# Patient Record
Sex: Female | Born: 1935 | Race: White | Hispanic: No | Marital: Married | Smoking: Never smoker
Health system: Southern US, Community
[De-identification: ages and names within clinical notes are randomized; demographics above are authoritative.]

## PROBLEM LIST (undated history)

## (undated) DIAGNOSIS — G309 Alzheimer's disease, unspecified: Secondary | ICD-10-CM

## (undated) DIAGNOSIS — F329 Major depressive disorder, single episode, unspecified: Secondary | ICD-10-CM

## (undated) DIAGNOSIS — R413 Other amnesia: Secondary | ICD-10-CM

## (undated) DIAGNOSIS — F32A Depression, unspecified: Secondary | ICD-10-CM

## (undated) DIAGNOSIS — A439 Nocardiosis, unspecified: Secondary | ICD-10-CM

## (undated) DIAGNOSIS — K219 Gastro-esophageal reflux disease without esophagitis: Secondary | ICD-10-CM

## (undated) DIAGNOSIS — F0280 Dementia in other diseases classified elsewhere without behavioral disturbance: Secondary | ICD-10-CM

## (undated) DIAGNOSIS — M7660 Achilles tendinitis, unspecified leg: Secondary | ICD-10-CM

## (undated) DIAGNOSIS — F419 Anxiety disorder, unspecified: Secondary | ICD-10-CM

## (undated) DIAGNOSIS — M797 Fibromyalgia: Secondary | ICD-10-CM

## (undated) HISTORY — DX: Major depressive disorder, single episode, unspecified: F32.9

## (undated) HISTORY — DX: Depression, unspecified: F32.A

## (undated) HISTORY — PX: CHOLECYSTECTOMY: SHX55

## (undated) HISTORY — DX: Other amnesia: R41.3

## (undated) HISTORY — PX: ABDOMINAL HYSTERECTOMY: SHX81

## (undated) HISTORY — DX: Anxiety disorder, unspecified: F41.9

## (undated) HISTORY — PX: APPENDECTOMY: SHX54

## (undated) HISTORY — PX: TONSILLECTOMY AND ADENOIDECTOMY: SUR1326

## (undated) HISTORY — DX: Gastro-esophageal reflux disease without esophagitis: K21.9

## (undated) HISTORY — DX: Dementia in other diseases classified elsewhere without behavioral disturbance: F02.80

## (undated) HISTORY — DX: Alzheimer's disease, unspecified: G30.9

## (undated) HISTORY — DX: Achilles tendinitis, unspecified leg: M76.60

## (undated) HISTORY — DX: Nocardiosis, unspecified: A43.9

## (undated) HISTORY — DX: Fibromyalgia: M79.7

## (undated) HISTORY — PX: BLADDER REPAIR: SHX76

## (undated) HISTORY — PX: BREAST BIOPSY: SHX20

---

## 1998-03-23 ENCOUNTER — Ambulatory Visit (HOSPITAL_COMMUNITY): Admission: RE | Admit: 1998-03-23 | Discharge: 1998-03-23 | Payer: Self-pay | Admitting: Family Medicine

## 1998-03-25 ENCOUNTER — Ambulatory Visit (HOSPITAL_COMMUNITY): Admission: RE | Admit: 1998-03-25 | Discharge: 1998-03-25 | Payer: Self-pay | Admitting: Ophthalmology

## 1998-04-19 ENCOUNTER — Other Ambulatory Visit: Admission: RE | Admit: 1998-04-19 | Discharge: 1998-04-19 | Payer: Self-pay | Admitting: Family Medicine

## 1999-04-21 ENCOUNTER — Other Ambulatory Visit: Admission: RE | Admit: 1999-04-21 | Discharge: 1999-04-21 | Payer: Self-pay | Admitting: Family Medicine

## 1999-12-02 ENCOUNTER — Encounter: Payer: Self-pay | Admitting: Family Medicine

## 1999-12-02 ENCOUNTER — Encounter: Admission: RE | Admit: 1999-12-02 | Discharge: 1999-12-02 | Payer: Self-pay | Admitting: Family Medicine

## 2000-01-20 ENCOUNTER — Ambulatory Visit (HOSPITAL_COMMUNITY): Admission: RE | Admit: 2000-01-20 | Discharge: 2000-01-20 | Payer: Self-pay | Admitting: Family Medicine

## 2000-01-20 ENCOUNTER — Encounter: Payer: Self-pay | Admitting: Family Medicine

## 2000-01-31 ENCOUNTER — Encounter: Payer: Self-pay | Admitting: Internal Medicine

## 2000-03-06 ENCOUNTER — Ambulatory Visit (HOSPITAL_COMMUNITY): Admission: RE | Admit: 2000-03-06 | Discharge: 2000-03-06 | Payer: Self-pay | Admitting: Gastroenterology

## 2000-03-06 ENCOUNTER — Encounter: Payer: Self-pay | Admitting: Gastroenterology

## 2000-04-26 ENCOUNTER — Encounter: Admission: RE | Admit: 2000-04-26 | Discharge: 2000-04-26 | Payer: Self-pay | Admitting: Family Medicine

## 2000-04-26 ENCOUNTER — Encounter: Payer: Self-pay | Admitting: Family Medicine

## 2000-04-26 ENCOUNTER — Other Ambulatory Visit: Admission: RE | Admit: 2000-04-26 | Discharge: 2000-04-26 | Payer: Self-pay | Admitting: Family Medicine

## 2000-04-26 ENCOUNTER — Encounter: Payer: Self-pay | Admitting: Internal Medicine

## 2000-05-31 ENCOUNTER — Encounter: Payer: Self-pay | Admitting: Family Medicine

## 2000-05-31 ENCOUNTER — Encounter: Admission: RE | Admit: 2000-05-31 | Discharge: 2000-05-31 | Payer: Self-pay | Admitting: Family Medicine

## 2001-04-30 ENCOUNTER — Encounter: Payer: Self-pay | Admitting: Family Medicine

## 2001-04-30 ENCOUNTER — Other Ambulatory Visit: Admission: RE | Admit: 2001-04-30 | Discharge: 2001-04-30 | Payer: Self-pay | Admitting: Family Medicine

## 2001-04-30 ENCOUNTER — Encounter: Admission: RE | Admit: 2001-04-30 | Discharge: 2001-04-30 | Payer: Self-pay | Admitting: Family Medicine

## 2001-06-03 ENCOUNTER — Encounter: Admission: RE | Admit: 2001-06-03 | Discharge: 2001-06-03 | Payer: Self-pay | Admitting: Family Medicine

## 2001-06-03 ENCOUNTER — Encounter: Payer: Self-pay | Admitting: Family Medicine

## 2002-03-17 ENCOUNTER — Ambulatory Visit (HOSPITAL_COMMUNITY): Admission: RE | Admit: 2002-03-17 | Discharge: 2002-03-17 | Payer: Self-pay | Admitting: Ophthalmology

## 2002-05-15 ENCOUNTER — Other Ambulatory Visit: Admission: RE | Admit: 2002-05-15 | Discharge: 2002-05-15 | Payer: Self-pay | Admitting: Family Medicine

## 2002-06-03 ENCOUNTER — Encounter: Payer: Self-pay | Admitting: Urology

## 2002-06-04 ENCOUNTER — Encounter: Admission: RE | Admit: 2002-06-04 | Discharge: 2002-06-04 | Payer: Self-pay | Admitting: Family Medicine

## 2002-06-04 ENCOUNTER — Encounter: Payer: Self-pay | Admitting: Family Medicine

## 2002-06-12 ENCOUNTER — Observation Stay (HOSPITAL_COMMUNITY): Admission: RE | Admit: 2002-06-12 | Discharge: 2002-06-13 | Payer: Self-pay | Admitting: Urology

## 2003-05-20 ENCOUNTER — Other Ambulatory Visit: Admission: RE | Admit: 2003-05-20 | Discharge: 2003-05-20 | Payer: Self-pay | Admitting: Family Medicine

## 2003-06-11 ENCOUNTER — Encounter: Admission: RE | Admit: 2003-06-11 | Discharge: 2003-06-11 | Payer: Self-pay | Admitting: Family Medicine

## 2003-06-11 ENCOUNTER — Encounter: Payer: Self-pay | Admitting: Family Medicine

## 2004-05-18 ENCOUNTER — Other Ambulatory Visit: Admission: RE | Admit: 2004-05-18 | Discharge: 2004-05-18 | Payer: Self-pay | Admitting: Family Medicine

## 2004-06-13 ENCOUNTER — Encounter: Admission: RE | Admit: 2004-06-13 | Discharge: 2004-06-13 | Payer: Self-pay | Admitting: Family Medicine

## 2004-12-27 ENCOUNTER — Encounter: Admission: RE | Admit: 2004-12-27 | Discharge: 2004-12-27 | Payer: Self-pay | Admitting: Family Medicine

## 2005-05-29 ENCOUNTER — Other Ambulatory Visit: Admission: RE | Admit: 2005-05-29 | Discharge: 2005-05-29 | Payer: Self-pay | Admitting: Family Medicine

## 2005-06-21 ENCOUNTER — Encounter: Admission: RE | Admit: 2005-06-21 | Discharge: 2005-06-21 | Payer: Self-pay | Admitting: Family Medicine

## 2005-08-15 ENCOUNTER — Ambulatory Visit: Payer: Self-pay | Admitting: Gastroenterology

## 2005-08-29 ENCOUNTER — Ambulatory Visit: Payer: Self-pay | Admitting: Gastroenterology

## 2006-06-22 ENCOUNTER — Encounter: Admission: RE | Admit: 2006-06-22 | Discharge: 2006-06-22 | Payer: Self-pay | Admitting: Family Medicine

## 2007-05-08 ENCOUNTER — Encounter: Admission: RE | Admit: 2007-05-08 | Discharge: 2007-05-08 | Payer: Self-pay | Admitting: Family Medicine

## 2007-06-24 ENCOUNTER — Encounter: Admission: RE | Admit: 2007-06-24 | Discharge: 2007-06-24 | Payer: Self-pay | Admitting: Family Medicine

## 2007-10-15 ENCOUNTER — Ambulatory Visit: Payer: Self-pay | Admitting: Gastroenterology

## 2007-10-15 LAB — CONVERTED CEMR LAB
ALT: 19 units/L (ref 0–35)
AST: 20 units/L (ref 0–37)
Albumin: 3.5 g/dL (ref 3.5–5.2)
Alkaline Phosphatase: 97 units/L (ref 39–117)
Amylase: 82 units/L (ref 27–131)
BUN: 14 mg/dL (ref 6–23)
Basophils Absolute: 0.1 10*3/uL (ref 0.0–0.1)
Basophils Relative: 0.9 % (ref 0.0–1.0)
Bilirubin, Direct: 0.1 mg/dL (ref 0.0–0.3)
CO2: 29 meq/L (ref 19–32)
Calcium: 9.2 mg/dL (ref 8.4–10.5)
Chloride: 103 meq/L (ref 96–112)
Creatinine, Ser: 0.9 mg/dL (ref 0.4–1.2)
Eosinophils Absolute: 0.1 10*3/uL (ref 0.0–0.6)
Eosinophils Relative: 1.5 % (ref 0.0–5.0)
Ferritin: 104.4 ng/mL (ref 10.0–291.0)
Folate: >20 ng/mL
GFR calc Af Amer: 80 mL/min
GFR calc non Af Amer: 66 mL/min
Glucose, Bld: 101 mg/dL — ABNORMAL HIGH (ref 70–99)
HCT: 38.4 % (ref 36.0–46.0)
Hemoglobin: 12.9 g/dL (ref 12.0–15.0)
Iron: 49 ug/dL (ref 42–145)
Lipase: 20 units/L (ref 11.0–59.0)
Lymphocytes Relative: 34 % (ref 12.0–46.0)
MCHC: 33.7 g/dL (ref 30.0–36.0)
MCV: 87.8 fL (ref 78.0–100.0)
Monocytes Absolute: 0.5 10*3/uL (ref 0.2–0.7)
Monocytes Relative: 7.7 % (ref 3.0–11.0)
Neutro Abs: 3.7 10*3/uL (ref 1.4–7.7)
Neutrophils Relative %: 55.9 % (ref 43.0–77.0)
Platelets: 273 10*3/uL (ref 150–400)
Potassium: 4.4 meq/L (ref 3.5–5.1)
RBC: 4.37 M/uL (ref 3.87–5.11)
RDW: 12.2 % (ref 11.5–14.6)
Saturation Ratios: 15.9 % — ABNORMAL LOW (ref 20.0–50.0)
Sodium: 140 meq/L (ref 135–145)
TSH: 0.63 microintl units/mL (ref 0.35–5.50)
Total Bilirubin: 0.6 mg/dL (ref 0.3–1.2)
Total Protein: 6.6 g/dL (ref 6.0–8.3)
Transferrin: 220.6 mg/dL (ref >=212.0)
Vitamin B-12: 436 pg/mL (ref 211–911)
WBC: 6.7 10*3/uL (ref 4.5–10.5)

## 2007-10-16 ENCOUNTER — Ambulatory Visit: Payer: Self-pay | Admitting: Gastroenterology

## 2007-10-17 ENCOUNTER — Ambulatory Visit (HOSPITAL_COMMUNITY): Admission: RE | Admit: 2007-10-17 | Discharge: 2007-10-17 | Payer: Self-pay | Admitting: Gastroenterology

## 2008-06-29 ENCOUNTER — Encounter: Admission: RE | Admit: 2008-06-29 | Discharge: 2008-06-29 | Payer: Self-pay | Admitting: Family Medicine

## 2009-01-01 ENCOUNTER — Encounter: Payer: Self-pay | Admitting: Gastroenterology

## 2009-02-09 ENCOUNTER — Ambulatory Visit: Payer: Self-pay | Admitting: Internal Medicine

## 2009-02-09 DIAGNOSIS — K219 Gastro-esophageal reflux disease without esophagitis: Secondary | ICD-10-CM | POA: Insufficient documentation

## 2009-02-09 DIAGNOSIS — F329 Major depressive disorder, single episode, unspecified: Secondary | ICD-10-CM | POA: Insufficient documentation

## 2009-02-09 DIAGNOSIS — G7000 Myasthenia gravis without (acute) exacerbation: Secondary | ICD-10-CM | POA: Insufficient documentation

## 2009-02-10 LAB — CONVERTED CEMR LAB
ALT: 21 units/L (ref 0–35)
AST: 24 units/L (ref 0–37)
Albumin: 3.5 g/dL (ref 3.5–5.2)
Alkaline Phosphatase: 99 units/L (ref 39–117)
BUN: 13 mg/dL (ref 6–23)
Basophils Absolute: 0 10*3/uL (ref 0.0–0.1)
Basophils Relative: 0.4 % (ref 0.0–3.0)
Bilirubin, Direct: 0.1 mg/dL (ref 0.0–0.3)
CO2: 31 meq/L (ref 19–32)
Calcium: 9.2 mg/dL (ref 8.4–10.5)
Chloride: 110 meq/L (ref 96–112)
Creatinine, Ser: 1 mg/dL (ref 0.4–1.2)
Eosinophils Absolute: 0.1 10*3/uL (ref 0.0–0.7)
Eosinophils Relative: 1.8 % (ref 0.0–5.0)
Glucose, Bld: 98 mg/dL (ref 70–99)
HCT: 37.3 % (ref 36.0–46.0)
Hemoglobin: 13.2 g/dL (ref 12.0–15.0)
Lymphocytes Relative: 32.3 % (ref 12.0–46.0)
Lymphs Abs: 2.2 10*3/uL (ref 0.7–4.0)
MCHC: 35.3 g/dL (ref 30.0–36.0)
MCV: 86.2 fL (ref 78.0–100.0)
Monocytes Absolute: 0.6 10*3/uL (ref 0.1–1.0)
Monocytes Relative: 8.5 % (ref 3.0–12.0)
Neutro Abs: 4 10*3/uL (ref 1.4–7.7)
Neutrophils Relative %: 57 % (ref 43.0–77.0)
Phosphorus: 3.6 mg/dL (ref 2.3–4.6)
Platelets: 279 10*3/uL (ref 150.0–400.0)
Potassium: 4.3 meq/L (ref 3.5–5.1)
RBC: 4.33 M/uL (ref 3.87–5.11)
RDW: 11.9 % (ref 11.5–14.6)
Sodium: 145 meq/L (ref 135–145)
TSH: 0.6 microintl units/mL (ref 0.35–5.50)
Total Bilirubin: 0.6 mg/dL (ref 0.3–1.2)
Total Protein: 6.7 g/dL (ref 6.0–8.3)
WBC: 6.9 10*3/uL (ref 4.5–10.5)

## 2009-03-08 ENCOUNTER — Ambulatory Visit: Payer: Self-pay | Admitting: Internal Medicine

## 2009-05-14 ENCOUNTER — Ambulatory Visit: Payer: Self-pay | Admitting: Internal Medicine

## 2009-06-04 ENCOUNTER — Encounter: Admission: RE | Admit: 2009-06-04 | Discharge: 2009-06-04 | Payer: Self-pay | Admitting: Internal Medicine

## 2009-06-07 ENCOUNTER — Encounter: Payer: Self-pay | Admitting: Internal Medicine

## 2009-07-06 ENCOUNTER — Telehealth: Payer: Self-pay | Admitting: Internal Medicine

## 2009-07-20 ENCOUNTER — Encounter: Payer: Self-pay | Admitting: Internal Medicine

## 2009-08-12 ENCOUNTER — Encounter: Payer: Self-pay | Admitting: Internal Medicine

## 2009-08-23 ENCOUNTER — Telehealth: Payer: Self-pay | Admitting: Internal Medicine

## 2009-10-02 ENCOUNTER — Ambulatory Visit: Payer: Self-pay | Admitting: Family Medicine

## 2009-10-02 DIAGNOSIS — J309 Allergic rhinitis, unspecified: Secondary | ICD-10-CM | POA: Insufficient documentation

## 2009-11-12 ENCOUNTER — Ambulatory Visit: Payer: Self-pay | Admitting: Internal Medicine

## 2009-11-12 DIAGNOSIS — F411 Generalized anxiety disorder: Secondary | ICD-10-CM | POA: Insufficient documentation

## 2009-11-13 LAB — CONVERTED CEMR LAB
ALT: 21 units/L (ref 0–35)
AST: 21 units/L (ref 0–37)
Albumin: 4 g/dL (ref 3.5–5.2)
Alkaline Phosphatase: 106 units/L (ref 39–117)
Bilirubin, Direct: <0.1 mg/dL (ref 0.0–0.3)
Total Bilirubin: 0.3 mg/dL (ref 0.3–1.2)
Total Protein: 6.7 g/dL (ref 6.0–8.3)

## 2009-11-15 LAB — CONVERTED CEMR LAB
Albumin: 4 g/dL (ref 3.5–5.2)
BUN: 15 mg/dL (ref 6–23)
Basophils Absolute: 0 10*3/uL (ref 0.0–0.1)
Basophils Relative: 0.6 % (ref 0.0–3.0)
CO2: 19 meq/L (ref 19–32)
Calcium: 9.4 mg/dL (ref 8.4–10.5)
Chloride: 108 meq/L (ref 96–112)
Creatinine, Ser: 0.98 mg/dL (ref 0.40–1.20)
Eosinophils Absolute: 0.1 10*3/uL (ref 0.0–0.7)
Eosinophils Relative: 1.8 % (ref 0.0–5.0)
Glucose, Bld: 106 mg/dL — ABNORMAL HIGH (ref 70–99)
HCT: 39.8 % (ref 36.0–46.0)
Hemoglobin: 13.3 g/dL (ref 12.0–15.0)
Lymphocytes Relative: 22.6 % (ref 12.0–46.0)
Lymphs Abs: 1.3 10*3/uL (ref 0.7–4.0)
MCHC: 33.4 g/dL (ref 30.0–36.0)
MCV: 89.6 fL (ref 78.0–100.0)
Monocytes Absolute: 0.5 10*3/uL (ref 0.1–1.0)
Monocytes Relative: 8.2 % (ref 3.0–12.0)
Neutro Abs: 3.9 10*3/uL (ref 1.4–7.7)
Neutrophils Relative %: 66.8 % (ref 43.0–77.0)
Phosphorus: 3.4 mg/dL (ref 2.3–4.6)
Platelets: 225 10*3/uL (ref 150.0–400.0)
Potassium: 4.4 meq/L (ref 3.5–5.3)
RBC: 4.44 M/uL (ref 3.87–5.11)
RDW: 12.2 % (ref 11.5–14.6)
Sodium: 144 meq/L (ref 135–145)
TSH: 0.87 microintl units/mL (ref 0.35–5.50)
WBC: 5.8 10*3/uL (ref 4.5–10.5)

## 2009-11-26 ENCOUNTER — Telehealth: Payer: Self-pay | Admitting: Internal Medicine

## 2009-11-29 ENCOUNTER — Telehealth: Payer: Self-pay | Admitting: Internal Medicine

## 2009-12-21 ENCOUNTER — Encounter: Payer: Self-pay | Admitting: Internal Medicine

## 2010-01-05 ENCOUNTER — Encounter: Payer: Self-pay | Admitting: Internal Medicine

## 2010-01-05 LAB — HM COLONOSCOPY

## 2010-02-15 ENCOUNTER — Telehealth: Payer: Self-pay | Admitting: Internal Medicine

## 2010-03-22 ENCOUNTER — Telehealth (INDEPENDENT_AMBULATORY_CARE_PROVIDER_SITE_OTHER): Payer: Self-pay | Admitting: *Deleted

## 2010-03-23 ENCOUNTER — Encounter: Payer: Self-pay | Admitting: Internal Medicine

## 2010-05-10 ENCOUNTER — Ambulatory Visit: Payer: Self-pay | Admitting: Internal Medicine

## 2010-05-10 DIAGNOSIS — IMO0001 Reserved for inherently not codable concepts without codable children: Secondary | ICD-10-CM | POA: Insufficient documentation

## 2010-05-10 DIAGNOSIS — M810 Age-related osteoporosis without current pathological fracture: Secondary | ICD-10-CM | POA: Insufficient documentation

## 2010-05-11 LAB — CONVERTED CEMR LAB
ALT: 18 units/L (ref 0–35)
AST: 24 units/L (ref 0–37)
Albumin: 3.7 g/dL (ref 3.5–5.2)
Alkaline Phosphatase: 86 units/L (ref 39–117)
BUN: 15 mg/dL (ref 6–23)
Basophils Absolute: 0.1 10*3/uL (ref 0.0–0.1)
Basophils Relative: 0.9 % (ref 0.0–3.0)
Bilirubin, Direct: 0.1 mg/dL (ref 0.0–0.3)
CO2: 30 meq/L (ref 19–32)
Calcium: 8.9 mg/dL (ref 8.4–10.5)
Chloride: 111 meq/L (ref 96–112)
Creatinine, Ser: 1.1 mg/dL (ref 0.4–1.2)
Eosinophils Absolute: 0.1 10*3/uL (ref 0.0–0.7)
Eosinophils Relative: 2.1 % (ref 0.0–5.0)
GFR calc non Af Amer: 54.09 mL/min (ref >60)
Glucose, Bld: 115 mg/dL — ABNORMAL HIGH (ref 70–99)
HCT: 38.2 % (ref 36.0–46.0)
Hemoglobin: 13.2 g/dL (ref 12.0–15.0)
Lymphocytes Relative: 25.2 % (ref 12.0–46.0)
Lymphs Abs: 1.4 10*3/uL (ref 0.7–4.0)
MCHC: 34.7 g/dL (ref 30.0–36.0)
MCV: 88.4 fL (ref 78.0–100.0)
Monocytes Absolute: 0.5 10*3/uL (ref 0.1–1.0)
Monocytes Relative: 8.4 % (ref 3.0–12.0)
Neutro Abs: 3.6 10*3/uL (ref 1.4–7.7)
Neutrophils Relative %: 63.4 % (ref 43.0–77.0)
Phosphorus: 3.2 mg/dL (ref 2.3–4.6)
Platelets: 240 10*3/uL (ref 150.0–400.0)
Potassium: 4.2 meq/L (ref 3.5–5.1)
RBC: 4.32 M/uL (ref 3.87–5.11)
RDW: 13.2 % (ref 11.5–14.6)
Sodium: 146 meq/L — ABNORMAL HIGH (ref 135–145)
TSH: 0.58 microintl units/mL (ref 0.35–5.50)
Total Bilirubin: 0.6 mg/dL (ref 0.3–1.2)
Total Protein: 6.2 g/dL (ref 6.0–8.3)
WBC: 5.7 10*3/uL (ref 4.5–10.5)

## 2010-05-14 ENCOUNTER — Emergency Department (HOSPITAL_COMMUNITY): Admission: EM | Admit: 2010-05-14 | Discharge: 2010-05-14 | Payer: Self-pay | Admitting: Emergency Medicine

## 2010-05-17 ENCOUNTER — Telehealth: Payer: Self-pay | Admitting: Internal Medicine

## 2010-05-24 ENCOUNTER — Telehealth: Payer: Self-pay | Admitting: Internal Medicine

## 2010-06-06 ENCOUNTER — Encounter: Admission: RE | Admit: 2010-06-06 | Discharge: 2010-06-06 | Payer: Self-pay | Admitting: Internal Medicine

## 2010-06-06 ENCOUNTER — Encounter: Payer: Self-pay | Admitting: Internal Medicine

## 2010-06-06 ENCOUNTER — Ambulatory Visit: Payer: Self-pay | Admitting: Family Medicine

## 2010-06-06 ENCOUNTER — Ambulatory Visit: Payer: Self-pay | Admitting: Internal Medicine

## 2010-06-06 LAB — CONVERTED CEMR LAB
Bilirubin Urine: NEGATIVE
Blood in Urine, dipstick: NEGATIVE
Glucose, Urine, Semiquant: NEGATIVE
Ketones, urine, test strip: NEGATIVE
Nitrite: POSITIVE
Protein, U semiquant: NEGATIVE
Specific Gravity, Urine: 1.02
Urobilinogen, UA: 0.2
pH: 6.5

## 2010-06-06 LAB — HM MAMMOGRAPHY: HM Mammogram: NORMAL

## 2010-06-07 ENCOUNTER — Encounter: Payer: Self-pay | Admitting: Family Medicine

## 2010-11-14 ENCOUNTER — Encounter: Payer: Self-pay | Admitting: Internal Medicine

## 2010-11-21 ENCOUNTER — Ambulatory Visit
Admission: RE | Admit: 2010-11-21 | Discharge: 2010-11-21 | Payer: Self-pay | Source: Home / Self Care | Attending: Internal Medicine | Admitting: Internal Medicine

## 2010-11-21 ENCOUNTER — Encounter: Payer: Self-pay | Admitting: Family Medicine

## 2010-11-29 NOTE — Procedures (Signed)
Summary: Colonoscopy/[facility]  Colonoscopy/[facility]   Imported By: Lanelle Bal 01/11/2010 08:14:26  _____________________________________________________________________  External Attachment:    Type:   Image     Comment:   External Document  Appended Document: Colonoscopy/[facility]    Clinical Lists Changes  Observations: Added new observation of COLONOSCOPY: small sessile rectal polyp--path done internal hemorrhoids diverticulosis recall 5 years Dr Loreta Ave (01/05/2010 8:28)       Colonoscopy  Procedure date:  01/05/2010  Findings:      small sessile rectal polyp--path done internal hemorrhoids diverticulosis recall 5 years Dr Loreta Ave

## 2010-11-29 NOTE — Assessment & Plan Note (Signed)
Summary: SICK   Vital Signs:  Patient profile:   75 year old female Weight:      172 pounds Temp:     97.8 degrees F oral Pulse rate:   84 / minute Pulse rhythm:   regular BP sitting:   118 / 72  (left arm) Cuff size:   regular  Vitals Entered By: Lowella Petties CMA (October 02, 2009 10:48 AM) CC: Sinus drainage   Acute Visit History:      The patient complains of cough, earache, sinus problems, and sore throat.  These symptoms began 2 weeks ago.  She denies fever, headache, and rash.  Other comments include: Has felt  mucus in throat for weeks, then in last 24 hours has felt worse.  Sneezing a lot. .        The character of the cough is described as nonproductive.  There is no history of wheezing, sleep interference, or shortness of breath associated with her cough.        She complains of sinus pressure, ears being blocked, and nasal congestion.        Problems Prior to Update: 1)  Chest Pain  (ICD-786.50) 2)  Depression  (ICD-311) 3)  Gerd  (ICD-530.81) 4)  Myasthenia Gravis Without Exacerbation  (ICD-358.00)  Current Medications (verified): 1)  Nexium 40 Mg  Cpdr (Esomeprazole Magnesium) .Marland Kitchen.. 1 Capsule Each Day 30 Minutes Before Meal 2)  Mestinon 60 Mg Tabs (Pyridostigmine Bromide) .... Take 2 Tablets By Mouth Three Times A Day 3)  Anaspaz 0.125 Mg Tabs (Hyoscyamine Sulfate) .... Take 1 Tablet 3-4 Times A Day 4)  Imipramine Pamoate 75 Mg Caps (Imipramine Pamoate) .... Take 1 Tablet By Mouth At Bedtime 5)  Calcium 600 1500 Mg Tabs (Calcium Carbonate) .... Take 2 By Mouth Once Daily 6)  One-A-Day 50 Plus  Tabs (Multiple Vitamins-Minerals) .... Take 1 Tablet By Mouth Once Daily 7)  Alprazolam 0.25 Mg Tabs (Alprazolam) .... Take 1 By Mouth Two Times A Day As Needed Anxiety 8)  Cetirizine Hcl 10 Mg Tabs (Cetirizine Hcl) .Marland Kitchen.. 1 Tab By Mouth Daily.  Allergies (verified): 1)  Fosamax (Alendronate Sodium)  Past History:  Past medical, surgical, family and social histories  (including risk factors) reviewed, and no changes noted (except as noted below).  Past Medical History: Reviewed history from 02/09/2009 and no changes required. 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 Appendectomy--1970 Cholecystectomy-8/08 Hysterectomy--1970     Carcinoma in situ Tonsillectomy/adenoidectomy  1945 Breast biopsy --1990's Bladder repair --1990's  Dr Patsi Sears  Family History: Reviewed history from 02/09/2009 and no changes required. Mom died of breast cancer @55  Dad died of cirrhosis @46  1 brother--had MI, ?HTN 2 sisters CAD in paternal uncles also No DM ?Uncle with HTN  Social History: Reviewed history from 02/09/2009 and no changes required. Married--3 children Retired--manager of school cafeteria  Never Smoked Alcohol use-no  Review of Systems General:  Denies fatigue and fever. CV:  Denies chest pain or discomfort. Resp:  Denies shortness of breath.  Physical Exam  General:  elderly female in NAD Ears:  clear fluid B TM, no pus, no bulging, moderate light reflex Nose:  nasal discharge clear, mucosal pallor.   Mouth:  MMM Neck:  no carotid bruit or thyromegaly no cervical or supraclavicular lymphadenopathy  Lungs:  Normal respiratory effort, chest expands symmetrically. Lungs are clear to auscultation, no crackles or wheezes. Heart:  Normal  rate and regular rhythm. S1 and S2 normal without gallop, murmur, click, rub or other extra sounds.   Impression & Recommendations:  Problem # 1:  ALLERGIC RHINITIS (ICD-477.9) Treat with nasal irrigationand antihistamine...no clear infection. Follow up with primary if not improving.  Her updated medication list for this problem includes:    Cetirizine Hcl 10 Mg Tabs (Cetirizine hcl) .Marland Kitchen... 1 tab by mouth daily.  Complete Medication List: 1)   Nexium 40 Mg Cpdr (Esomeprazole magnesium) .Marland Kitchen.. 1 capsule each day 30 minutes before meal 2)  Mestinon 60 Mg Tabs (Pyridostigmine bromide) .... Take 2 tablets by mouth three times a day 3)  Anaspaz 0.125 Mg Tabs (Hyoscyamine sulfate) .... Take 1 tablet 3-4 times a day 4)  Imipramine Pamoate 75 Mg Caps (Imipramine pamoate) .... Take 1 tablet by mouth at bedtime 5)  Calcium 600 1500 Mg Tabs (Calcium carbonate) .... Take 2 by mouth once daily 6)  One-a-day 50 Plus Tabs (Multiple vitamins-minerals) .... Take 1 tablet by mouth once daily 7)  Alprazolam 0.25 Mg Tabs (Alprazolam) .... Take 1 by mouth two times a day as needed anxiety 8)  Cetirizine Hcl 10 Mg Tabs (Cetirizine hcl) .Marland Kitchen.. 1 tab by mouth daily.  Patient Instructions: 1)  Zyrtec at bedtime. 2)  Start nasal saline spray or irrigation.   Prior Medications (reviewed today): NEXIUM 40 MG  CPDR (ESOMEPRAZOLE MAGNESIUM) 1 capsule each day 30 minutes before meal MESTINON 60 MG TABS (PYRIDOSTIGMINE BROMIDE) take 2 tablets by mouth three times a day ANASPAZ 0.125 MG TABS (HYOSCYAMINE SULFATE) take 1 tablet 3-4 times a day IMIPRAMINE PAMOATE 75 MG CAPS (IMIPRAMINE PAMOATE) take 1 tablet by mouth at bedtime CALCIUM 600 1500 MG TABS (CALCIUM CARBONATE) take 2 by mouth once daily ONE-A-DAY 50 PLUS  TABS (MULTIPLE VITAMINS-MINERALS) take 1 tablet by mouth once daily ALPRAZOLAM 0.25 MG TABS (ALPRAZOLAM) take 1 by mouth two times a day as needed anxiety Current Allergies (reviewed today): FOSAMAX (ALENDRONATE SODIUM)

## 2010-11-29 NOTE — Progress Notes (Signed)
Summary: still feeling well  Phone Note Call from Patient   Caller: Patient Call For: [name] Salt MD Summary of Call: Pt called with follow up after going to ER.  She says she has finished the abx that she was given for UTI and still feels fine.  Asks again if she needs to follow up, advised her no, as long as she is feeling well, but if anything changes she is to call us back. Initial call taken by: Lowella Petties CMA,  May 24, 2010 4:34 PM  Follow-up for Phone Call        okay Follow-up by: [name] Salt MD,  May 24, 2010 7:43 PM

## 2010-11-29 NOTE — Progress Notes (Signed)
Summary: wants referral to GI  Phone Note Call from Patient Call back at Home Phone [phone]   Caller: Patient Call For: Cindee Salt MD Summary of Call: Pt wants referral to Dr. Charna Elizabeth for colonoscopy and endoscocpy.  Please advise. Initial call taken by: Lowella Petties CMA,  July 06, 2009 12:04 PM  Follow-up for Phone Call        referral made I assume she has had prior visits  ?due for follow up studies?? Follow-up by: Cindee Salt MD,  July 06, 2009 1:56 PM  Additional Follow-up for Phone Call Additional follow up Details #1::        Appt made with Dr Loreta Ave on 07/20/2009 at 9:00. Additional Follow-up by: Carlton Adam,  July 06, 2009 2:58 PM

## 2010-11-29 NOTE — Assessment & Plan Note (Signed)
Summary: NEW PT TO EST/CLE   Vital Signs:  Patient profile:   75 year old female Height:      66.5 inches Weight:      167 pounds BMI:     26.65 Temp:     98.6 degrees F oral Pulse rate:   68 / minute Pulse rhythm:   regular BP sitting:   120 / 80  (left arm) Cuff size:   regular  Vitals Entered By: Mervin Hack CMA (February 09, 2009 11:57 AM)  History of Present Illness: CC: new patient to establish care  Establishing here Had been with Dr Artis Flock and they were not staying due to the concierge care  Myasthennia gravis for about 3-4 years Dr Sandria Manly treats has mestinon--gives her stomach trouble. 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  Past Surgical History:    Appendectomy--1970    Cholecystectomy-8/08    Hysterectomy--1970     Carcinoma in situ    Tonsillectomy/adenoidectomy  1945    Breast biopsy --1990's    Bladder repair --1990's  Dr Patsi Sears  Family History:    Mom died of breast cancer @55     Dad died of cirrhosis @46     1 brother--had MI, ?HTN    2 sisters    CAD in paternal uncles also    No DM    ?Uncle with HTN  Social History:    Married--3 Engineer, structural of school cafeteria     Never Smoked    Alcohol use-no    Smoking Status:  never  Review of Systems General:  had sleep problems with gall bladder illness and lost weight back closer to normal  weight wears seat belt. Eyes:  Complains of double vision; denies vision loss-1 eye; diplopia with myasthenia. ENT:  Complains of ringing in ears; denies decreased hearing; mild tinnitus for last 2 weeks Own teeth--regular with dentist. CV:  Complains of chest pain or discomfort; denies difficulty breathing at night, difficulty breathing while lying down, fainting, lightheadness, palpitations, and shortness of breath with exertion; Occ mild chest discomfort--she relates to gas and indigestion. Resp:  Complains of cough; denies shortness of breath; occ slgiht cough. GI:  Complains of indigestion; denies abdominal pain, bloody stools, change in bowel habits, and dark tarry stools; bowels more regular since cholecystectomy metamucil in past. GU:  Complains of incontinence and nocturia; denies dysuria; had tacking procedure by Dr Patsi Sears. MS:  Denies joint pain and joint swelling; no sig joint issues. Derm:  Denies lesion(s) and rash; stable warty growths--nothing changing. Neuro:  Complains of numbness and weakness; denies headaches and tingling; occ weakness with myasthenia mild numbness in feet--intermittent----has been thoroughly evaluated. Psych:  Denies anxiety and depression. Heme:  Denies abnormal bruising and enlarge lymph nodes.  Physical Exam  General:  alert and normal appearance.   Eyes:  pupils equal, pupils round, and pupils reactive to light.   Full EOM  Mouth:  no erythema and no lesions.   Neck:  supple, no masses, no thyromegaly, no carotid bruits, and no cervical lymphadenopathy.   Lungs:  normal respiratory effort and normal breath sounds.   Heart:  normal rate, regular rhythm, no murmur, and no gallop.   Abdomen:  soft, non-tender, and no masses.   Msk:  no joint tenderness and no joint swelling.   Pulses:  faint to 1+ in feet Extremities:  no edema Neurologic:  alert & oriented X3, strength normal in all extremities, and gait normal.   Skin:  no rashes and no  suspicious lesions.   Axillary Nodes:  No palpable lymphadenopathy Psych:  normally interactive, good eye contact, not anxious appearing, and not depressed appearing.     Impression & Recommendations:  Problem # 1:  CHEST PAIN (ICD-786.50) Assessment New  sounds fairly classic like reflux EKG benign will check labs though  Orders: EKG w/ Interpretation (93000) TLB-Renal Function Panel (80069-RENAL) TLB-CBC Platelet - w/Differential (85025-CBCD) TLB-Hepatic/Liver Function Pnl (80076-HEPATIC) TLB-TSH (Thyroid Stimulating Hormone) (84443-TSH) Venipuncture (14782)  Problem # 2:  DEPRESSION (ICD-311) Assessment: Improved will have her try to wean off this and see her back  Her updated medication list for this problem includes:    Imipramine Pamoate 75 Mg Caps (Imipramine pamoate) .Marland Kitchen... Take 1 tablet by mouth at bedtime  Problem # 3:  GERD (ICD-530.81) Assessment: Unchanged mild but ongoing symptoms  needs to continue meds  Her updated medication list for this problem includes:    Nexium 40 Mg Cpdr (Esomeprazole magnesium) .Marland Kitchen... 1 capsule each day 30 minutes before meal    Anaspaz 0.125 Mg Tabs (Hyoscyamine sulfate) .Marland Kitchen... Take 1 tablet 3-4 times a day  Problem # 4:  MYASTHENIA GRAVIS WITHOUT EXACERBATION (ICD-358.00) Assessment: Comment Only Dr Sandria Manly treats  Complete Medication List: 1)  Nexium 40 Mg Cpdr (Esomeprazole magnesium) .Marland Kitchen.. 1 capsule each day 30 minutes before meal 2)  Mestinon 60 Mg Tabs (Pyridostigmine bromide) .... Take 2 tablets by mouth three times a day 3)  Anaspaz 0.125 Mg Tabs (Hyoscyamine sulfate) .... Take 1 tablet 3-4 times a day 4)  Imipramine Pamoate 75 Mg Caps (Imipramine pamoate) .... Take 1 tablet by mouth at bedtime 5)  Calcium 600 1500 Mg Tabs (Calcium carbonate) .... Take 1 by mouth once daily 6)  One-a-day 50 Plus Tabs (Multiple vitamins-minerals) .... Take 1 tablet by mouth once daily  Other Orders: Pneumoccal Vaccine Adm- Medicare  (G0009) Admin 1st Vaccine (95621)  Patient Instructions: 1)  Please decrease the imipramine to 2 days per week for 2 weeks and if still doing well, then stop it 2)  Please schedule a follow-up appointment in 3 months .       Current Allergies (reviewed today): FOSAMAX (ALENDRONATE SODIUM)  Immunization History:  Tetanus/Td Immunization History:    Tetanus/Td:  Historical (05/20/2003)  Pneumovax Immunization History:    Pneumovax:  Pneumovax (Medicare) (02/09/2009)  Zostavax History:    Zostavax # 1:  Zostavax (09/30/2007)  Immunizations Administered:  Pneumonia Vaccine:    Vaccine Type: Pneumovax (Medicare)    Site: right deltoid    Mfr: Merck    Dose: 0.5 ml    Route: IM    Given by: Mervin Hack CMA    Exp. Date: 10/02/2009    Lot #: [id number]    VIS given: 05/27/96 version given February 09, 2009.

## 2010-11-29 NOTE — Progress Notes (Signed)
Summary: asking for amitriptyline  Phone Note Call from Patient Call back at Home Phone [phone]   Caller: Patient Call For: Cindee Salt MD Summary of Call: Pt had a problem a few years ago with aching in her arms.  Dr. Phylliss Bob treated her with amitriptyline.  The achiness has come back and she is asking if she can try that again.  She generally has pain in her arms, legs, feet.  Just doesnt feel well.  Uses [facility]. Initial call taken by: Lowella Petties CMA,  February 15, 2010 4:39 PM  Follow-up for Phone Call        She is on imipramine which is very similar Probably need to set up appt to evaluate what is going on and make a decision about Rx Follow-up by: Cindee Salt MD,  February 16, 2010 8:14 AM  Additional Follow-up for Phone Call Additional follow up Details #1::        spoke with patient and she have been off the imipramine since feb/mar, she doesn't remember. Pt states she was told by her pharmacist that she just can't stop taking that she must titrate that med, i asked her why she stopped she said Dr. Ermalene Searing told her to when she seen her on a Sat? that was back in Dec 2010. Pt seen Dr. Alphonsus Sias in Jan 2011. Pt would like to know if she should re-start med? pt was very confused. DeShannon Smith CMA Duncan Dull)  February 16, 2010 9:39 AM   Yes, she should restart the medicine That is what had been helping the pain Cindee Salt MD  February 16, 2010 11:25 AM   left message on machine with results, advised pt to call if any questions.  Additional Follow-up by: Mervin Hack CMA Duncan Dull),  February 16, 2010 2:51 PM

## 2010-11-29 NOTE — Miscellaneous (Signed)
Summary: BONE DENSITY  Clinical Lists Changes  Orders: Added new Test order of T-Bone Densitometry (77080) - Signed Added new Test order of T-Lumbar Vertebral Assessment (77082) - Signed 

## 2010-11-29 NOTE — Assessment & Plan Note (Signed)
Summary: ? UTI   Vital Signs:  Patient profile:   75 year old female Height:      66.5 inches Weight:      177.50 pounds BMI:     28.32 Temp:     97.7 degrees F oral Pulse rate:   92 / minute Pulse rhythm:   regular BP sitting:   122 / 80  (left arm) Cuff size:   regular  Vitals Entered By: Delilah Shan CMA Duncan Dull) (June 06, 2010 2:04 PM) CC: ? UTI   History of Present Illness: dysuria: yes, mild feeling of burning. duration of symptoms: since ER visit 05/14/10.  abdominal pain: yes fevers: no back pain:no vomiting:no other concerns: Prev at ER for presyncope.  Started on keflex.  No presyncope since then.  Some improvement in symptoms but not complete resolution.     Allergies: 1)  Fosamax (Alendronate Sodium)  Review of Systems       See HPI.  Otherwise negative.    Physical Exam  General:  GEN: nad, alert and oriented HEENT: mucous membranes moist NECK: supple CV: rrr.  PULM: ctab, no inc wob ABD: soft, +bs, suprapubic area minimally tender, no rebound EXT: no edema SKIN: no acute rash BACK: no CVA pain    Impression & Recommendations:  Problem # 1:  DYSURIA (ICD-788.1) Start cipro, have patient call back if fever or increase in symptoms.  Contact with ucx results.  Orders: Specimen Handling (16109) T-Culture, Urine ([id number])  Her updated medication list for this problem includes:    Cipro 250 Mg Tabs (Ciprofloxacin hcl) .Marland Kitchen... 1 by mouth two times a day  for 3 days  Complete Medication List: 1)  Mestinon 60 Mg Tabs (Pyridostigmine bromide) .... Take 2 tablets by mouth two times a day 2)  Anaspaz 0.125 Mg Tabs (Hyoscyamine sulfate) .... Take 1 tablet two times a day 3)  Imipramine Pamoate 75 Mg Caps (Imipramine pamoate) .... Take 1 tablet by mouth at bedtime 4)  One-a-day 50 Plus Tabs (Multiple vitamins-minerals) .... Take 1 tablet by mouth once daily 5)  Omeprazole 20 Mg Cpdr (Omeprazole) .... Take 1 by mouth once daily 6)  Ergocalciferol  50000 Unit Caps (Ergocalciferol) .Marland Kitchen.. 1 tab by mouth monthly 7)  Calcium Carbonate-vitamin D 600-400 Mg-unit Tabs (Calcium carbonate-vitamin d) .... Two per day 8)  Cipro 250 Mg Tabs (Ciprofloxacin hcl) .Marland Kitchen.. 1 by mouth two times a day  for 3 days  Patient Instructions: 1)  We'll contact you with your lab report.  Take the antibiotics and let us know if you aren't getting better.  Prescriptions: CIPRO 250 MG TABS (CIPROFLOXACIN HCL) 1 by mouth two times a day  for 3 days  #6 x 0   Entered and Authorized by:   Crawford Givens MD   Signed by:   Crawford Givens MD on 06/06/2010   Method used:   Print then Give to Patient   RxID:   [id number]   Laboratory Results   Urine Tests  Date/Time Received: June 06, 2010 2:13 PM   Routine Urinalysis   Color: yellow Appearance: Hazy Glucose: negative   (Normal Range: Negative) Bilirubin: negative   (Normal Range: Negative) Ketone: negative   (Normal Range: Negative) Spec. Gravity: 1.020   (Normal Range: 1.003-1.035) Blood: negative   (Normal Range: Negative) pH: 6.5   (Normal Range: 5.0-8.0) Protein: negative   (Normal Range: Negative) Urobilinogen: 0.2   (Normal Range: 0-1) Nitrite: positive   (Normal Range: Negative) Leukocyte Esterace: small   (  Normal Range: Negative)        Current Allergies (reviewed today): FOSAMAX (ALENDRONATE SODIUM)

## 2010-11-29 NOTE — Consult Note (Signed)
Summary: [facility]  [facility]   Imported By: Lanelle Bal 01/10/2010 11:19:18  _____________________________________________________________________  External Attachment:    Type:   Image     Comment:   External Document  Appended Document: [facility] planning screening colonoscopy

## 2010-11-29 NOTE — Assessment & Plan Note (Signed)
Summary: 3 MTH F/U/CLE   Vital Signs:  Patient profile:   75 year old female Weight:      168 pounds Temp:     98.7 degrees F oral Pulse rate:   76 / minute Pulse rhythm:   regular BP sitting:   120 / 70  (left arm) Cuff size:   regular  Vitals Entered By: Mervin Hack CMA (May 14, 2009 10:48 AM)  History of Present Illness: Chief Complaint: 3 month follow-up  Feels some better  Her eyes seem better Mestinon helps but does bother her stomach takes amaspaz 30 minutes before mestinon and generally tolerates  Nexium seems to control heartburn Still some mild symptoms she relates to since chole  Just had extended trip [place], [place], etc Myasthenia did well  has started trying to wean the imipramine started every other night cause mood was okay again  MIld memory problems seems like just recall issues no apparent apraxia   Allergies: 1)  Fosamax (Alendronate Sodium)  Past History:  Past medical, surgical, family and social histories (including risk factors) reviewed for relevance to current acute and chronic problems.  Past Medical History: Reviewed history from 02/09/2009 and no changes required. 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 Appendectomy--1970 Cholecystectomy-8/08 Hysterectomy--1970     Carcinoma in situ Tonsillectomy/adenoidectomy  1945 Breast biopsy --1990's Bladder repair --1990's  Dr Patsi Sears  Family History: Reviewed history from 02/09/2009 and no changes required. Mom died of breast cancer @55  Dad died of cirrhosis @46  1 brother--had MI, ?HTN 2 sisters CAD in paternal uncles also No DM ?Uncle with HTN  Social History: Reviewed history from 02/09/2009 and no changes required. 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         stress with son-in law leaving marriage--future unclear weight is stable Sleeps well with the imipramine nocturia x 2 appetite is fine Intermittent abd symptoms--upper ?spasm  Physical Exam  General:  alert and normal appearance.   Neck:  supple, no masses, no thyromegaly, no carotid bruits, and no cervical lymphadenopathy.   Lungs:  normal respiratory effort and normal breath sounds.   Heart:  normal rate, regular rhythm, no murmur, and no gallop.   Abdomen:  soft and non-tender.   Extremities:  no edema Psych:  normally interactive, good eye contact, not anxious appearing, and not depressed appearing.     Impression & Recommendations:  Problem # 1:  DEPRESSION (ICD-311) Assessment Improved but again trying to wean imipramine counselled to continue med and not try wean  Her updated medication list for this problem includes:    Imipramine Pamoate 75 Mg Caps (Imipramine pamoate) .Marland Kitchen... Take 1 tablet by mouth at bedtime    Alprazolam 0.25 Mg Tabs (Alprazolam) .Marland Kitchen... Take 1 by mouth two times a day as needed anxiety  Problem # 2:  MYASTHENIA GRAVIS WITHOUT EXACERBATION (ICD-358.00) Assessment: Unchanged doing well needs anaspaz before taking mestinon did well even with extended travelling Dr Sandria Manly manages  Problem # 3:  GERD (ICD-530.81) Assessment: Unchanged fine with the nexium  Her updated medication list for this problem includes:    Nexium 40 Mg Cpdr (Esomeprazole magnesium) .Marland Kitchen... 1 capsule each day 30 minutes before meal    Anaspaz 0.125 Mg Tabs (Hyoscyamine sulfate) .Marland Kitchen... Take 1 tablet 3-4 times a day  Complete  Medication List: 1)  Nexium 40 Mg Cpdr (Esomeprazole magnesium) .Marland Kitchen.. 1 capsule each day 30 minutes before meal 2)  Mestinon 60 Mg Tabs (Pyridostigmine bromide) .... Take 2 tablets by mouth three times a day 3)  Anaspaz 0.125 Mg Tabs (Hyoscyamine sulfate)  .... Take 1 tablet 3-4 times a day 4)  Imipramine Pamoate 75 Mg Caps (Imipramine pamoate) .... Take 1 tablet by mouth at bedtime 5)  Calcium 600 1500 Mg Tabs (Calcium carbonate) .... Take 2 by mouth once daily 6)  One-a-day 50 Plus Tabs (Multiple vitamins-minerals) .... Take 1 tablet by mouth once daily 7)  Alprazolam 0.25 Mg Tabs (Alprazolam) .... Take 1 by mouth two times a day as needed anxiety  Patient Instructions: 1)  Please continue to take the imipramine each evening 2)  Please schedule a follow-up appointment in 6 months .   Current Allergies (reviewed today): FOSAMAX (ALENDRONATE SODIUM)

## 2010-11-29 NOTE — Letter (Signed)
Summary: Results Follow up Letter  [facility]  [address] [place], [place] [address]   Phone: [phone]  Fax: [phone]    06/07/2009 MRN: [id number]  [facility] [address] [place], [place]  [address]  Dear Ms. Minnehan,  The following are the results of your recent test(s):  Test         Result    Pap Smear:        Normal _____  Not Normal _____ Comments: ______________________________________________________ Cholesterol: LDL(Bad cholesterol):         Your goal is less than:         HDL (Good cholesterol):       Your goal is more than: Comments:  ______________________________________________________ Mammogram:        Normal __X___  Not Normal _____ Comments: Repeat in 1 year  ___________________________________________________________________ Hemoccult:        Normal _____  Not normal _______ Comments:    _____________________________________________________________________ Other Tests:    We routinely do not discuss normal results over the telephone.  If you desire a copy of the results, or you have any questions about this information we can discuss them at your next office visit.   Sincerely,      Tillman Abide, MD

## 2010-11-29 NOTE — Assessment & Plan Note (Signed)
Summary: 6 month follow-up   Vital Signs:  Patient profile:   75 year old female Weight:      172 pounds BMI:     27.44 Temp:     98.1 degrees F oral Pulse rate:   88 / minute Pulse rhythm:   regular BP sitting:   120 / 80  (left arm) Cuff size:   regular  Vitals Entered By: Mervin Hack CMA Duncan Dull) (November 12, 2009 10:43 AM) CC: 40month follow-up   History of Present Illness: Doing fairly well No new concerns  At times, she doesn't feel right Not sure if it is the myasthenia Seems to be more at the end of the day Decreased energy later in day No limitations though No eye problems  No sig GI problems Still on nexium for acid problems---GI recommends continuing Bowels okay  No overt depression Mild "down" feelings--as noted Occ anxiety--hasn't needed xanax in general  Allergies: 1)  Fosamax (Alendronate Sodium)  Past History:  Past medical, surgical, family and social histories (including risk factors) reviewed for relevance to current acute and chronic problems.  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 Appendectomy--1970 Cholecystectomy-8/08 Hysterectomy--1970     Carcinoma in situ Tonsillectomy/adenoidectomy  1945 Breast biopsy --1990's Bladder repair --1990's  Dr Patsi Sears  Family History: Reviewed history from 02/09/2009 and no changes required. Mom died of breast cancer @55  Dad died of cirrhosis @46  1 brother--had MI, ?HTN 2 sisters CAD in paternal uncles also No DM ?Uncle with HTN  Social History: Reviewed history from 02/09/2009 and no changes required. Married--3 children Retired--manager of school cafeteria  Never Smoked Alcohol use-no  Review of Systems       appetite okay still uses anaspaz before the mestiinon weight stable Sleeps  okay--some nocturia  Physical Exam  General:  alert and normal appearance.   Neck:  supple, no masses, no thyromegaly, no carotid bruits, and no cervical lymphadenopathy.   Lungs:  normal respiratory effort and normal breath sounds.   Heart:  normal rate, regular rhythm, no murmur, and no gallop.   Abdomen:  soft, non-tender, no masses, no hepatomegaly, and no splenomegaly.   Msk:  no joint tenderness and no joint swelling.   Pulses:  1+ in feet Extremities:  no edema Neurologic:  alert & oriented X3, strength normal in all extremities, and gait normal.   Skin:  no rashes and no suspicious lesions.   Axillary Nodes:  No palpable lymphadenopathy Inguinal Nodes:  No significant adenopathy Psych:  normally interactive, good eye contact, not anxious appearing, and not depressed appearing.     Impression & Recommendations:  Problem # 1:  FATIGUE (ICD-780.79) Assessment New  vague, mostly end of day May be shorter days and being stuck inside will just check some labs  Orders: TLB-Renal Function Panel (80069-RENAL) TLB-CBC Platelet - w/Differential (85025-CBCD) TLB-Hepatic/Liver Function Pnl (80076-HEPATIC) TLB-TSH (Thyroid Stimulating Hormone) (84443-TSH) Venipuncture ([address])  Problem # 2:  DEPRESSION (ICD-311) Assessment: Unchanged fair control on imipramine recommeded continuing  Her updated medication list for this problem includes:    Imipramine Pamoate 75 Mg Caps (Imipramine pamoate) .Marland Kitchen... Take 1 tablet by mouth at bedtime    Alprazolam 0.25 Mg Tabs (Alprazolam) .Marland Kitchen... Take 1 by mouth two times a day as needed anxiety  Problem # 3:  GERD (ICD-530.81) Assessment: Unchanged stable on meds  Her updated medication list for this problem includes:  Nexium 40 Mg Cpdr (Esomeprazole magnesium) .Marland Kitchen... 1 capsule each day 30 minutes before meal    Anaspaz 0.125 Mg Tabs (Hyoscyamine sulfate) .Marland Kitchen... Take 1 tablet two times a day  Problem # 4:  MYASTHENIA GRAVIS WITHOUT  EXACERBATION (ICD-358.00) Assessment: Comment Only stable Dr Sandria Manly manages  Complete Medication List: 1)  Nexium 40 Mg Cpdr (Esomeprazole magnesium) .Marland Kitchen.. 1 capsule each day 30 minutes before meal 2)  Mestinon 60 Mg Tabs (Pyridostigmine bromide) .... Take 2 tablets by mouth two times a day 3)  Anaspaz 0.125 Mg Tabs (Hyoscyamine sulfate) .... Take 1 tablet two times a day 4)  Imipramine Pamoate 75 Mg Caps (Imipramine pamoate) .... Take 1 tablet by mouth at bedtime 5)  Calcium 600 1500 Mg Tabs (Calcium carbonate) .... Take 2 by mouth once daily 6)  One-a-day 50 Plus Tabs (Multiple vitamins-minerals) .... Take 1 tablet by mouth once daily 7)  Alprazolam 0.25 Mg Tabs (Alprazolam) .... Take 1 by mouth two times a day as needed anxiety 8)  Cetirizine Hcl 10 Mg Tabs (Cetirizine hcl) .Marland Kitchen.. 1 tab by mouth daily.  Patient Instructions: 1)  Please schedule a follow-up appointment in 6 months .   Current Allergies (reviewed today): FOSAMAX (ALENDRONATE SODIUM)  Appended Document: Orders Update    Clinical Lists Changes  Orders: Added new Test order of T- * Misc. Laboratory test [phone]) - Signed

## 2010-11-29 NOTE — Progress Notes (Signed)
Summary: prior auth needed for omeprazole  Phone Note From Pharmacy   Caller: [facility] Summary of Call: Prior Berkley Harvey is needed for omeprazole, form is on  your desk. Initial call taken by: Lowella Petties CMA,  Mar 22, 2010 10:16 AM  Follow-up for Phone Call        form done Follow-up by: Cindee Salt MD,  Mar 22, 2010 1:46 PM  Additional Follow-up for Phone Call Additional follow up Details #1::        Form faxed to [facility]. Additional Follow-up by: Beau Fanny,  Mar 22, 2010 2:37 PM

## 2010-11-29 NOTE — Miscellaneous (Signed)
Summary: Nexium refill  Clinical Lists Changes  Medications: Added new medication of NEXIUM 40 MG  CPDR (ESOMEPRAZOLE MAGNESIUM) 1 capsule each day 30 minutes before meal - Signed Rx of NEXIUM 40 MG  CPDR (ESOMEPRAZOLE MAGNESIUM) 1 capsule each day 30 minutes before meal;  #30 x 6;  Signed;  Entered by: Harlow Mares CMA;  Authorized by: Mardella Layman MD [facility];  Method used: Faxed to [facility], [address], [place], [place], [place]  [address], Ph: [phone] or [phone], Fax: [phone]    Prescriptions: NEXIUM 40 MG  CPDR (ESOMEPRAZOLE MAGNESIUM) 1 capsule each day 30 minutes before meal  #30 x 6   Entered by:   Harlow Mares CMA   Authorized by:   Mardella Layman MD [facility]   Signed by:   Harlow Mares CMA on 01/01/2009   Method used:   Faxed to ...       [facility] (retail)       [address]       [place], [place]  [address]       Ph: [phone] or [phone]       Fax: [phone]   RxID:   [id number]

## 2010-11-29 NOTE — Progress Notes (Signed)
Summary:  IMIPRAMINE PAMOATE  Phone Note Refill Request Message from:  [facility] [phone] on August 23, 2009 10:30 AM  Refills Requested: Medication #1:  IMIPRAMINE PAMOATE 75 MG CAPS take 1 tablet by mouth at bedtime E-Scribe Request, no last refilled date   Method Requested: Telephone to Pharmacy Initial call taken by: DeShannon Katrinka Blazing CMA Duncan Dull),  August 23, 2009 10:31 AM  Follow-up for Phone Call        okay to refill x 1 year Follow-up by: Cindee Salt MD,  August 23, 2009 1:35 PM  Additional Follow-up for Phone Call Additional follow up Details #1::        Rx faxed to pharmacy Additional Follow-up by: DeShannon Katrinka Blazing CMA Duncan Dull),  August 23, 2009 2:36 PM    Prescriptions: IMIPRAMINE PAMOATE 75 MG CAPS (IMIPRAMINE PAMOATE) take 1 tablet by mouth at bedtime  #30 x 12   Entered by:   Mervin Hack CMA (AAMA)   Authorized by:   Cindee Salt MD   Signed by:   Mervin Hack CMA (AAMA) on 08/23/2009   Method used:   [facility] to        [facility] [phone]* (retail)       [address]       [place], [place]  [address]       Ph: [phone] or [phone]       Fax: [phone]   RxID:   [id number]

## 2010-11-29 NOTE — Progress Notes (Signed)
Summary: Rx refill  Phone Note Refill Request Call back at Home Phone [phone] Message from:  Patient on November 29, 2009 1:39 PM  Left message on machine for patient to return call regarding which medication she wants.  Linde Gillis CMA Duncan Dull)  November 29, 2009 1:39 PM   Caller: Patient Call For: Cindee Salt MD    New/Updated Medications: OMEPRAZOLE 20 MG CPDR (OMEPRAZOLE) take 1 by mouth once daily Prescriptions: OMEPRAZOLE 20 MG CPDR (OMEPRAZOLE) take 1 by mouth once daily  #30 x 12   Entered by:   Mervin Hack CMA (AAMA)   Authorized by:   Cindee Salt MD   Signed by:   Mervin Hack CMA (AAMA) on 11/29/2009   Method used:   Faxed to ...       [facility] (retail)       [address]       [place], [place]  [address]       Ph: [phone] or [phone]       Fax: [phone]   RxID:   [id number]

## 2010-11-29 NOTE — Letter (Signed)
Summary: [facility] Records  [facility]James [facility] Records   Imported By: Beau Fanny 02/12/2009 15:57:23  _____________________________________________________________________  External Attachment:    Type:   Image     Comment:   External Document

## 2010-11-29 NOTE — Assessment & Plan Note (Signed)
Summary: 6 month follow up/rbh   Vital Signs:  Patient profile:   75 year old female Weight:      175 pounds Temp:     98.3 degrees F oral Pulse rate:   80 / minute Pulse rhythm:   regular BP sitting:   116 / 70  (left arm) Cuff size:   regular  Vitals Entered By: Mervin Hack CMA (AAMA) (May 10, 2010 11:00 AM) CC: 6 month follow-up   History of Present Illness: Had episode where she was very achy had been off the imipramine Did restart and she is feeling better  Stress with husband Recurrent kidney stones---had to cut vacation short Now having lithotripsies  feels that her anxiety is reasonably controlled the imipramine seems to help that some as well  DOing fine on the mestinon Controls the myasthenia  has to take the anaspaz for her stomach to tolerate the mestinon  stomach quiet on omeprazole bowels okay--variable    Allergies: 1)  Fosamax (Alendronate Sodium)  Past History:  Past medical, surgical, family and social histories (including risk factors) reviewed for relevance to current acute and chronic problems.  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 Appendectomy--1970 Cholecystectomy-8/08 Hysterectomy--1970     Carcinoma in situ Tonsillectomy/adenoidectomy  1945 Breast biopsy --1990's Bladder repair --1990's  Dr Patsi Sears  Family History: Reviewed history from 02/09/2009 and no changes required. Mom died of breast cancer @55  Dad died of cirrhosis @46  1 brother--had MI, ?HTN 2 sisters CAD in paternal uncles also No DM ?Uncle with HTN  Social History: Reviewed history from 02/09/2009 and no changes required. Married--3 children Retired--manager of school cafeteria  Never Smoked Alcohol use-no  Review of  Systems       sleps well with imipramine nocturia x 2 appetite is somewhat better now after prolonged trouble after gallbaldder weight is stable  Physical Exam  General:  alert and normal appearance.   Neck:  supple, no masses, no thyromegaly, no carotid bruits, and no cervical lymphadenopathy.   Lungs:  normal respiratory effort and normal breath sounds.   Heart:  normal rate, regular rhythm, no murmur, and no gallop.   Abdomen:  soft and non-tender.   Msk:  no joint tenderness and no joint swelling.   Pulses:  1+ in feet Extremities:  no edema Psych:  normally interactive, good eye contact, not anxious appearing, and not depressed appearing.     Impression & Recommendations:  Problem # 1:  FIBROMYALGIA (ICD-729.1) Assessment Comment Only  better back on the imipramine will consider wean next time if doing okay  Orders: Venipuncture ([address]) TLB-Renal Function Panel (80069-RENAL) TLB-CBC Platelet - w/Differential (85025-CBCD) TLB-Hepatic/Liver Function Pnl (80076-HEPATIC) TLB-TSH (Thyroid Stimulating Hormone) (84443-TSH)  Problem # 2:  OSTEOPOROSIS (ICD-733.00) Assessment: Comment Only  borderline 7 years ago adequate calcium will start monthly vitamin D recheck DEXA---consider bisphosphonate but had stomach trouble in past with fosamax  Orders: Radiology Referral (Radiology)  Problem # 3:  GERD (ICD-530.81) Assessment: Unchanged does okay with the med  Her updated medication list for this problem includes:    Anaspaz 0.125 Mg Tabs (Hyoscyamine sulfate) .Marland Kitchen... Take 1 tablet two times a day    Omeprazole 20 Mg Cpdr (Omeprazole) .Marland Kitchen... Take 1 by mouth once daily  Problem # 4:  MYASTHENIA GRAVIS WITHOUT EXACERBATION (ICD-358.00) Assessment: Unchanged Dr Sandria Manly manages she wonders about coming off the med---I told  her not to, talk to Dr Sandria Manly  Complete Medication List: 1)  Mestinon 60 Mg Tabs (Pyridostigmine bromide) .... Take 2 tablets by mouth two times a day 2)   Anaspaz 0.125 Mg Tabs (Hyoscyamine sulfate) .... Take 1 tablet two times a day 3)  Imipramine Pamoate 75 Mg Caps (Imipramine pamoate) .... Take 1 tablet by mouth at bedtime 4)  One-a-day 50 Plus Tabs (Multiple vitamins-minerals) .... Take 1 tablet by mouth once daily 5)  Omeprazole 20 Mg Cpdr (Omeprazole) .... Take 1 by mouth once daily 6)  Ergocalciferol 50000 Unit Caps (Ergocalciferol) .Marland Kitchen.. 1 tab by mouth monthly  Patient Instructions: 1)  Please schedule a follow-up appointment in 6 months .  2)  Schedule your mammogram.  3)  SChedule the bone density test Prescriptions: ERGOCALCIFEROL 50000 UNIT CAPS (ERGOCALCIFEROL) 1 tab by mouth monthly  #12 x 0   Entered and Authorized by:   Cindee Salt MD   Signed by:   Cindee Salt MD on 05/10/2010   Method used:   Print then Give to Patient   RxID:   [id number]   Current Allergies (reviewed today): FOSAMAX (ALENDRONATE SODIUM)   Bone Scan  Procedure date:  06/11/2003  Findings:      Borderline osteoporosis in lumbar spine (only a couple of the vertebrae) Hip is borderline osteopenia (between -1 and -1.6)

## 2010-11-29 NOTE — Letter (Signed)
Summary: [facility]   Imported By: Maryln Gottron 07/30/2009 10:30:47  _____________________________________________________________________  External Attachment:    Type:   Image     Comment:   External Document  Appended Document: [facility] nexium for GERD High fiber recommended for her diverticulosis

## 2010-11-29 NOTE — Progress Notes (Signed)
Summary: needs labs faxed  Phone Note Call from Patient Call back at Home Phone [phone]   Caller: Patient Call For: Cindee Salt MD Summary of Call: Pt has upcoming colonoscopy with Dr. Loreta Ave and she is asking that her recent lab results be faxed to that office at fax # [phone]. Initial call taken by: Lowella Petties CMA,  November 26, 2009 4:24 PM  Follow-up for Phone Call        okay to fax results Follow-up by: Cindee Salt MD,  November 27, 2009 10:49 AM  Additional Follow-up for Phone Call Additional follow up Details #1::        results faxed Additional Follow-up by: DeShannon Katrinka Blazing CMA Duncan Dull),  November 29, 2009 9:09 AM

## 2010-11-29 NOTE — Progress Notes (Signed)
Summary: went to [place]  Phone Note Call from Patient   Caller: Patient Call For: Cindee Salt MD Summary of Call: Pt states she went to [place] over the week end after having an episode of abd pain and confusion.  She was dx'd with a UTI and was told to follow up with you.  She says she is feeling better and will wait a few days to see how she does.  The did do a head CT to rule out stroke and it was normal.  Initial call taken by: Lowella Petties CMA,  May 17, 2010 9:37 AM  Follow-up for Phone Call        Please get the records from the ER and check on her tomorrow or Thursday to be sure she is still doing well Cindee Salt MD  May 17, 2010 12:14 PM   Additional Follow-up for Phone Call Additional follow up Details #1::        Spoke to patient and was informed that she is feeling much better. ER records are in your in box. Sydell Axon LPN  May 17, 2010 2:54 PM   Noted Additional Follow-up by: Cindee Salt MD,  May 17, 2010 5:31 PM

## 2010-11-29 NOTE — Letter (Signed)
Summary: [facility]  [facility]   Imported By: Maryln Gottron 08/17/2009 12:17:27  _____________________________________________________________________  External Attachment:    Type:   Image     Comment:   External Document  Appended Document: [facility] doing fine with ocular myasthenia gravis

## 2010-11-29 NOTE — Assessment & Plan Note (Signed)
Summary: Discuss meds   Vital Signs:  Patient profile:   75 year old female Weight:      167 pounds Temp:     98.6 degrees F oral Pulse rate:   76 / minute Pulse rhythm:   regular BP sitting:   112 / 60  (left arm) Cuff size:   regular  Vitals Entered By: Mervin Hack CMA (Mar 08, 2009 10:28 AM)  History of Present Illness: Chief Complaint: discuss meds  Weaned off imipramine Off completely for about 2 weeks has been awakening not feeling great  Starts to feel better as the day goes on  had Rx for citalopram but hasn't been taking it (Dr Artis Flock told her to stick with imipramine and not celexa)  Doesn't endorse anxiety hadn't used xanax since last year--but needed it a couple of times this week  does feel some depression Feels "down .Marland KitchenMarland KitchenMarland KitchenI just don't want to do anything"  Allergies: 1)  Fosamax (Alendronate Sodium)  Past History:  Past medical, surgical, family and social histories (including risk factors) reviewed for relevance to current acute and chronic problems.  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  Past Surgical History:    Reviewed history from 02/09/2009 and no changes required:    Appendectomy--1970    Cholecystectomy-8/08    Hysterectomy--1970     Carcinoma in situ    Tonsillectomy/adenoidectomy  1945    Breast biopsy --1990's    Bladder repair --1990's  Dr Patsi Sears  Family History:    Reviewed history from 02/09/2009 and no changes required:       Mom died of breast cancer @55        Dad died of cirrhosis @46        1 brother--had MI, ?HTN       2 sisters       CAD in paternal uncles also       No DM       ?Uncle with HTN  Social History:    Reviewed history from 02/09/2009 and no changes required:       Married--3 children       Retired--manager of school  cafeteria        Never Smoked       Alcohol use-no  Review of Systems       had lost 25# when sick with mysasthenia seems stable now Stress with daughter having family problems--husband left her some leg cramps again---more noticeable recently  Physical Exam  General:  alert and normal appearance.   Psych:  normally interactive, good eye contact, not anxious appearing, and not depressed appearing.     Impression & Recommendations:  Problem # 1:  DEPRESSION (ICD-311) Assessment Deteriorated lack of initiative and not "doing" as much may be related to sleep changes off imipramine but could also be a worsening of mild depression (more like a dysthymia)  P:  discussed options      will restart imipramine    hasn't been taking the citalopram--will remove from list     will refill xanax--hers is over 74 year old  Her updated medication list for this problem includes:    Imipramine Pamoate 75 Mg Caps (Imipramine pamoate) .Marland Kitchen... Take 1 tablet by mouth at bedtime    Alprazolam 0.25 Mg Tabs (Alprazolam) .Marland Kitchen... Take 1 by mouth two times a day  as needed anxiety  Complete Medication List: 1)  Nexium 40 Mg Cpdr (Esomeprazole magnesium) .Marland Kitchen.. 1 capsule each day 30 minutes before meal 2)  Mestinon 60 Mg Tabs (Pyridostigmine bromide) .... Take 2 tablets by mouth three times a day 3)  Anaspaz 0.125 Mg Tabs (Hyoscyamine sulfate) .... Take 1 tablet 3-4 times a day 4)  Imipramine Pamoate 75 Mg Caps (Imipramine pamoate) .... Take 1 tablet by mouth at bedtime 5)  Calcium 600 1500 Mg Tabs (Calcium carbonate) .... Take 1 by mouth once daily 6)  One-a-day 50 Plus Tabs (Multiple vitamins-minerals) .... Take 1 tablet by mouth once daily 7)  Alprazolam 0.25 Mg Tabs (Alprazolam) .... Take 1 by mouth two times a day as needed anxiety  Patient Instructions: 1)  Keep appointment on July 16th 2)  No citalopram 3)  Resume the imipramine 75mg  at bedtime Prescriptions: ALPRAZOLAM 0.25 MG TABS (ALPRAZOLAM) take  1 by mouth two times a day as needed anxiety  #30 x 0   Entered and Authorized by:   Cindee Salt MD   Signed by:   Cindee Salt MD on 03/08/2009   Method used:   Print then Give to Patient   RxID:   [id number]     Current Allergies (reviewed today): FOSAMAX (ALENDRONATE SODIUM)

## 2010-11-29 NOTE — Medication Information (Signed)
Summary: [employer] Prior Auth Approval for Omeprazole Capsule Dr from 03/02/10-  [employer] Prior Auth Approval for Omeprazole Capsule Dr from 03/02/10-03/23/11   Imported By: Beau Fanny 03/24/2010 14:50:16  _____________________________________________________________________  External Attachment:    Type:   Image     Comment:   External Document

## 2010-12-01 NOTE — Assessment & Plan Note (Signed)
Summary: 6 MONTH FOLLOW UP/[facility]   Vital Signs:  Patient profile:   75 year old female Weight:      174 pounds Temp:     97.8 degrees F oral Pulse rate:   84 / minute Pulse rhythm:   regular BP sitting:   128 / 88  (right arm) Cuff size:   regular  Vitals Entered By: Lowella Petties CMA, AAMA (November 21, 2010 10:28 AM) CC: 6 month follow up   History of Present Illness: Doing well No new concerns  Myasthenia is quiet Dr Sandria Manly just saw her and now doing yearly follow ups Occ feels tired in Am and will often feel better in the evening Never been a "easy getter upper"  Needs the anaspaz and PPi for her stomach this keeps it controlled  Mood has been good has cut back on the imipramine to every other day Not clear that she gets up easier without it Occ mild anxiety but nothing troubling Occ mild depressed mood---nothing persistent Feels better if she is busy  Discussed that bone density was improved on calcium and vitamin D still  Doesn't think she was fasting for her last blood work Glucose 115 probably normal then  Allergies: 1)  Fosamax (Alendronate Sodium)  Past History:  Past medical, surgical, family and social histories (including risk factors) reviewed for relevance to current acute and chronic problems.  Past Medical History: Reviewed history from 05/10/2010 and no changes required. 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 Appendectomy--1970 Cholecystectomy-8/08 Hysterectomy--1970     Carcinoma in situ Tonsillectomy/adenoidectomy  1945 Breast biopsy --1990's Bladder repair --1990's  Dr Patsi Sears  Family History: Reviewed history from 02/09/2009 and no changes required. Mom died of breast cancer @55  Dad died of cirrhosis @46  1 brother--had MI,  ?HTN 2 sisters CAD in paternal uncles also No DM ?Uncle with HTN  Social History: Reviewed history from 02/09/2009 and no changes required. Married--3 children Retired--manager of school cafeteria  Never Smoked Alcohol use-no  Review of Systems       Husband found with bladder cancer---seems okay with treatment appetite is fair weight is down a couple of pounds sleeps okay but nocturia x 1-2  Physical Exam  General:  alert and normal appearance.   Neck:  supple, no masses, no thyromegaly, no carotid bruits, and no cervical lymphadenopathy.   Lungs:  normal respiratory effort, no intercostal retractions, no accessory muscle use, and normal breath sounds.   Heart:  normal rate, regular rhythm, no murmur, and no gallop.   Abdomen:  soft, non-tender, and no masses.   Msk:  no joint tenderness and no joint swelling.   Extremities:  no edema Psych:  normally interactive, good eye contact, not anxious appearing, and not depressed appearing.     Impression & Recommendations:  Problem # 1:  GERD (ICD-530.81) Assessment Unchanged controlled with meds  Her updated medication list for this problem includes:    Anaspaz 0.125 Mg Tabs (Hyoscyamine sulfate) .Marland Kitchen... Take 1 tablet two times a day    Omeprazole 20 Mg Cpdr (Omeprazole) .Marland Kitchen... Take 1 by mouth once daily  Problem # 2:  OSTEOPOROSIS (ICD-733.00) Assessment: Improved bone density now down to osteopenic range will continue the meds   Her updated medication list for this problem includes:    Ergocalciferol 50000 Unit Caps (Ergocalciferol) .Marland Kitchen... 1 tab by mouth monthly    Calcium Carbonate-vitamin D 600-400  Mg-unit Tabs (Calcium carbonate-vitamin d) .Marland Kitchen..Marland Kitchen Two per day  Problem # 3:  ANXIETY (ICD-300.00) Assessment: Unchanged only occ spells has weaned some--- may need to continue every other day   Her updated medication list for this problem includes:    Imipramine Pamoate 75 Mg Caps (Imipramine pamoate) .Marland Kitchen... Take 1 tablet  every other night  at bedtime  Problem # 4:  MYASTHENIA GRAVIS WITHOUT EXACERBATION (ICD-358.00) Assessment: Unchanged stable sees Dr Sandria Manly  Complete Medication List: 1)  Mestinon 60 Mg Tabs (Pyridostigmine bromide) .... Take 2 tablets by mouth two times a day 2)  Anaspaz 0.125 Mg Tabs (Hyoscyamine sulfate) .... Take 1 tablet two times a day 3)  Imipramine Pamoate 75 Mg Caps (Imipramine pamoate) .... Take 1 tablet every other night  at bedtime 4)  One-a-day 50 Plus Tabs (Multiple vitamins-minerals) .... Take 1 tablet by mouth once daily 5)  Omeprazole 20 Mg Cpdr (Omeprazole) .... Take 1 by mouth once daily 6)  Ergocalciferol 50000 Unit Caps (Ergocalciferol) .Marland Kitchen.. 1 tab by mouth monthly 7)  Calcium Carbonate-vitamin D 600-400 Mg-unit Tabs (Calcium carbonate-vitamin d) .... Two per day  Patient Instructions: 1)  Please schedule a follow-up appointment in 6 months .  Prescriptions: ERGOCALCIFEROL 50000 UNIT CAPS (ERGOCALCIFEROL) 1 tab by mouth monthly  #12 x 0   Entered and Authorized by:   Cindee Salt MD   Signed by:   Cindee Salt MD on 11/21/2010   Method used:   Electronically to        [facility] (retail)       [address]       [place], [place]  [address]       Ph: [phone] or [phone]       Fax: [phone]   RxID:   [id number]    Orders Added: 1)  Est. Patient Level IV [[address]]    Prior Medications (reviewed today): MESTINON 60 MG TABS (PYRIDOSTIGMINE BROMIDE) take 2 tablets by mouth two times a day ANASPAZ 0.125 MG TABS (HYOSCYAMINE SULFATE) take 1 tablet two times a day ONE-A-DAY 50 PLUS  TABS (MULTIPLE VITAMINS-MINERALS) take 1 tablet by mouth once daily OMEPRAZOLE 20 MG CPDR (OMEPRAZOLE) take 1 by mouth once daily ERGOCALCIFEROL 50000 UNIT CAPS (ERGOCALCIFEROL) 1 tab by mouth monthly CALCIUM CARBONATE-VITAMIN D 600-400 MG-UNIT  TABS (CALCIUM CARBONATE-VITAMIN D) Two per day Current Allergies: FOSAMAX  (ALENDRONATE SODIUM)

## 2010-12-01 NOTE — Letter (Signed)
Summary: [facility]  [facility]   Imported By: Maryln Gottron 11/21/2010 14:36:53  _____________________________________________________________________  External Attachment:    Type:   Image     Comment:   External Document  Appended Document: [facility] myasthenia gravis stable 1 year follow up

## 2011-01-14 LAB — URINALYSIS, ROUTINE W REFLEX MICROSCOPIC
Bilirubin Urine: NEGATIVE
Glucose, UA: NEGATIVE mg/dL
Hgb urine dipstick: NEGATIVE
Ketones, ur: NEGATIVE mg/dL
Nitrite: NEGATIVE
Protein, ur: NEGATIVE mg/dL
Specific Gravity, Urine: 1.017 (ref 1.005–1.030)
Urobilinogen, UA: 1 mg/dL (ref 0.0–1.0)
pH: 7.5 (ref 5.0–8.0)

## 2011-01-14 LAB — DIFFERENTIAL
Basophils Absolute: 0 10*3/uL (ref 0.0–0.1)
Basophils Relative: 1 % (ref 0–1)
Eosinophils Absolute: 0.1 10*3/uL (ref 0.0–0.7)
Eosinophils Relative: 1 % (ref 0–5)
Lymphocytes Relative: 22 % (ref 12–46)
Lymphs Abs: 1.2 10*3/uL (ref 0.7–4.0)
Monocytes Absolute: 0.4 10*3/uL (ref 0.1–1.0)
Monocytes Relative: 8 % (ref 3–12)
Neutro Abs: 3.8 10*3/uL (ref 1.7–7.7)
Neutrophils Relative %: 69 % (ref 43–77)

## 2011-01-14 LAB — BASIC METABOLIC PANEL
BUN: 15 mg/dL (ref 6–23)
CO2: 25 mEq/L (ref 19–32)
Calcium: 8.5 mg/dL (ref 8.4–10.5)
Chloride: 112 mEq/L (ref 96–112)
Creatinine, Ser: 1.14 mg/dL (ref 0.4–1.2)
GFR calc Af Amer: 57 mL/min — ABNORMAL LOW (ref >60)
GFR calc non Af Amer: 47 mL/min — ABNORMAL LOW (ref >60)
Glucose, Bld: 99 mg/dL (ref 70–99)
Potassium: 3.7 mEq/L (ref 3.5–5.1)
Sodium: 143 mEq/L (ref 135–145)

## 2011-01-14 LAB — CBC
HCT: 38.6 % (ref 36.0–46.0)
Hemoglobin: 13.3 g/dL (ref 12.0–15.0)
MCH: 30.3 pg (ref 26.0–34.0)
MCHC: 34.4 g/dL (ref 30.0–36.0)
MCV: 88.2 fL (ref 78.0–100.0)
Platelets: 237 10*3/uL (ref 150–400)
RBC: 4.38 MIL/uL (ref 3.87–5.11)
RDW: 13 % (ref 11.5–15.5)
WBC: 5.4 10*3/uL (ref 4.0–10.5)

## 2011-01-14 LAB — URINE CULTURE
Colony Count: >=100000
Culture  Setup Time: 201107162144

## 2011-01-14 LAB — URINE MICROSCOPIC-ADD ON

## 2011-02-04 ENCOUNTER — Other Ambulatory Visit: Payer: Self-pay | Admitting: Internal Medicine

## 2011-03-02 ENCOUNTER — Other Ambulatory Visit: Payer: Self-pay | Admitting: Gynecology

## 2011-03-14 NOTE — Assessment & Plan Note (Signed)
[facility]                         GASTROENTEROLOGY OFFICE NOTE   NAME:[name], [name] POSA                        MRN:          [id number]  DATE:10/15/2007                            DOB:          11-19-1935    [name] [name] is a 75 year old white female who presents with episodes of  epigastric abdominal pain radiating to her back ever since her  cholecystectomy in August of 2008, by Dr. Michaell Cowing. She apparently had  multiple gallstones and had laparoscopic cholecystectomy. I do not have  that report currently for review. She relates that since the surgery she  has had recurrent episodes of rather severe sharp epigastric pain  radiating to her back with some nausea. These episodes will last 1-2  days and are associated with gas and bloating, but no true hepatobiliary  complaints such as clay-colored stools, dark urine, icterus, fever or  chills. Her appetite is good and her weight is stable. She has chronic  GERD for which she takes Nexium 40 mg a day, this recently has been  increased to twice a day by Dr. Michaell Cowing. In the past I have seen her for  chronic functional constipation and she has responded to Metamucil  therapy. Her last endoscopy examination was in 2004 at which time she  had a peptic stricture with esophagus dilated. Last colonoscopy  examination was by Dr. Arlyce Dice in April of 2001. She does have a known  large gastric diverticulum which was seen on previous upper GI series in  May of 2001. She also has some benign fundal gastric polyps that have  previously been biopsied by Dr. Arlyce Dice.   In addition to Nexium she takes multivitamins, calcium, and fish oil.   She denies abuse of NSAIDS, cigarettes, or alcohol. SHE DOES HAVE A  HISTORY OF REACTIONS IN THE PAST TO SULFA MEDICATION.   She is a healthy-appearing white female in no acute distress.  I could not appreciate stigmata of chronic liver disease. She weighs 181  pounds, which is down some 10  pounds from normal weight. Blood pressure  118/70. Pulse 70 and regular.  CHEST: Was clear.  She appeared to be in a regular rhythm without murmurs, gallops or rubs.  ABDOMEN: Showed no organomegaly, masses or significant tenderness. Bowel  sounds were normal. I could not appreciate secussion splash.  Peripheral extremities were unremarkable.  Mental status was normal.  Rectal examination was deferred.   ASSESSMENT:  1. Chronic gastroesophageal reflux disease doing well on PPI therapy.  2. History of asymptomatic gastric diverticulum.  3. Status post cholecystectomy for cholelithiasis with what sounds      like possible episodes of perhaps acute relapsing pancreatis, rule      out common bile duct stone.  4. History of diverticulosis and chronic constipation.  5. Family history of colon carcinoma per the patient's records.   RECOMMENDATIONS:  1. Check CBC, liver profile, amylase and lipase.  2. Endoscopy and ultrasound examination.  3. Continue twice a day PPI therapy.  4. Consider followup colonoscopy examination for family history.     Vania Rea. Jarold Motto, MD, Caleen Essex,  FAGA  Electronically Signed    DRP/MedQ  DD: 10/15/2007  DT: 10/15/2007  Job #: [id number]   cc:   Ardeth Sportsman, MD

## 2011-05-23 ENCOUNTER — Ambulatory Visit: Payer: Self-pay | Admitting: Internal Medicine

## 2011-05-29 ENCOUNTER — Other Ambulatory Visit: Payer: Self-pay | Admitting: Internal Medicine

## 2011-05-29 DIAGNOSIS — Z1231 Encounter for screening mammogram for malignant neoplasm of breast: Secondary | ICD-10-CM

## 2011-06-09 ENCOUNTER — Ambulatory Visit
Admission: RE | Admit: 2011-06-09 | Discharge: 2011-06-09 | Disposition: A | Discharge status: Home / Self Care | Payer: Medicare Other | Source: Ambulatory Visit | Attending: Internal Medicine | Admitting: Internal Medicine

## 2011-06-09 DIAGNOSIS — Z1231 Encounter for screening mammogram for malignant neoplasm of breast: Secondary | ICD-10-CM

## 2011-06-14 ENCOUNTER — Encounter: Payer: Self-pay | Admitting: *Deleted

## 2011-06-23 ENCOUNTER — Ambulatory Visit: Payer: Medicare Other | Admitting: Internal Medicine

## 2011-06-26 ENCOUNTER — Encounter: Payer: Self-pay | Admitting: Internal Medicine

## 2011-06-29 ENCOUNTER — Encounter: Payer: Self-pay | Admitting: Internal Medicine

## 2011-06-29 ENCOUNTER — Ambulatory Visit (INDEPENDENT_AMBULATORY_CARE_PROVIDER_SITE_OTHER): Payer: Medicare Other | Admitting: Internal Medicine

## 2011-06-29 DIAGNOSIS — F329 Major depressive disorder, single episode, unspecified: Secondary | ICD-10-CM

## 2011-06-29 DIAGNOSIS — G7000 Myasthenia gravis without (acute) exacerbation: Secondary | ICD-10-CM

## 2011-06-29 DIAGNOSIS — M810 Age-related osteoporosis without current pathological fracture: Secondary | ICD-10-CM

## 2011-06-29 DIAGNOSIS — K219 Gastro-esophageal reflux disease without esophagitis: Secondary | ICD-10-CM

## 2011-06-29 MED ORDER — IMIPRAMINE PAMOATE 75 MG PO CAPS: 75.0000 mg | ORAL_CAPSULE | Freq: Every day | ORAL | Status: DC

## 2011-06-29 NOTE — Patient Instructions (Signed)
Please restart the imipramine and take nightly. Call if you are having any troubles going back on this It would be okay to skip days taking the omeprazole and go down to every other day only

## 2011-06-29 NOTE — Assessment & Plan Note (Signed)
Still seems controlled with the mestinon

## 2011-06-29 NOTE — Assessment & Plan Note (Signed)
Is taking vitamin with 800 iu of vitamin D She will confirm Works in yard and walks regularly

## 2011-06-29 NOTE — Assessment & Plan Note (Signed)
Seems like a dysthymia Mood has deteriorated off the imipramine Will restart

## 2011-06-29 NOTE — Assessment & Plan Note (Signed)
Controlled with the omeprazole Discussed it would be okay to decrease to every other day if symptoms controlled

## 2011-06-29 NOTE — Progress Notes (Signed)
Subjective:    Patient ID: [name] [name], female    DOB: 09-Dec-1935, 75 y.o.   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 hyoscyamine (LEVSIN, ANASPAZ) 0.125 MG tablet Take 0.125 mg by mouth 2 (two) times daily.        Marland Kitchen imipramine (TOFRANIL-PM) 75 MG capsule Take 75 mg by mouth daily as needed.        Marland Kitchen omeprazole (PRILOSEC) 20 MG capsule Take 20 mg by mouth daily.        Marland Kitchen pyridostigmine (MESTINON) 60 MG tablet Take 120 mg by mouth 2 (two) times daily.          Allergies  Allergen Reactions  . Alendronate Sodium     REACTION: upset stomach    Past Medical History  Diagnosis Date  . Myasthenia gravis   . GERD (gastroesophageal reflux disease)   . Depression   . Nocardia infection   . Anxiety   . Fibromyalgia   . Osteoporosis     Past Surgical History  Procedure Date  . Appendectomy   . Cholecystectomy   . Abdominal hysterectomy   . Tonsillectomy and adenoidectomy   . Breast biopsy   . Bladder repair     Family History  Problem Relation Age of Onset  . Hypertension Brother   . Heart disease Brother   . Heart disease Paternal Uncle   . Hypertension Paternal Uncle   . Diabetes Neg Hx     History   Social History  . Marital Status: Married    Spouse Name: N/A    Number of Children: 3  . Years of Education: N/A   Occupational History  . retired Paediatric nurse    Social  History Main Topics  . Smoking status: Never Smoker   . Smokeless tobacco: Never Used  . Alcohol Use: No  . Drug Use: No  . Sexually Active: Not on file   Other Topics Concern  . Not on file   Social History Narrative  . No narrative on file      Review of Systems Bowels are regular Appetite is great Weight is stable    Objective:   Physical Exam  Constitutional: She appears well-developed and well-nourished. No distress.  Neck: Normal range of motion. Neck supple.  Cardiovascular: Normal rate, regular rhythm, normal heart sounds and intact distal pulses.  Exam reveals no gallop.   No murmur heard. Pulmonary/Chest: Effort normal and breath sounds normal. No respiratory distress. She has no wheezes. She has no rales.  Abdominal: Soft. There is no tenderness.  Musculoskeletal: Normal range of motion. She exhibits no edema and no tenderness.  Lymphadenopathy:    She has no cervical adenopathy.  Neurological: She exhibits normal muscle tone.       No weakness  Psychiatric: Her behavior is normal. Judgment and thought content normal.       Mood is neutral and appropriate affect  Assessment & Plan:

## 2011-06-30 LAB — CBC WITH DIFFERENTIAL/PLATELET
Basophils Absolute: 0 10*3/uL (ref 0.0–0.1)
Basophils Relative: 0.4 % (ref 0.0–3.0)
Eosinophils Absolute: 0.1 10*3/uL (ref 0.0–0.7)
Eosinophils Relative: 1.5 % (ref 0.0–5.0)
HCT: 37.7 % (ref 36.0–46.0)
Hemoglobin: 12.7 g/dL (ref 12.0–15.0)
Lymphocytes Relative: 31 % (ref 12.0–46.0)
Lymphs Abs: 1.8 10*3/uL (ref 0.7–4.0)
MCHC: 33.8 g/dL (ref 30.0–36.0)
MCV: 87.9 fl (ref 78.0–100.0)
Monocytes Absolute: 0.5 10*3/uL (ref 0.1–1.0)
Monocytes Relative: 8.4 % (ref 3.0–12.0)
Neutro Abs: 3.4 10*3/uL (ref 1.4–7.7)
Neutrophils Relative %: 58.7 % (ref 43.0–77.0)
Platelets: 195 10*3/uL (ref 150.0–400.0)
RBC: 4.29 Mil/uL (ref 3.87–5.11)
RDW: 13.6 % (ref 11.5–14.6)
WBC: 5.8 10*3/uL (ref 4.5–10.5)

## 2011-06-30 LAB — BASIC METABOLIC PANEL
BUN: 18 mg/dL (ref 6–23)
CO2: 24 mEq/L (ref 19–32)
Calcium: 8.9 mg/dL (ref 8.4–10.5)
Chloride: 113 mEq/L — ABNORMAL HIGH (ref 96–112)
Creatinine, Ser: 1 mg/dL (ref 0.4–1.2)
GFR: 59.03 mL/min — ABNORMAL LOW (ref >60.00)
Glucose, Bld: 109 mg/dL — ABNORMAL HIGH (ref 70–99)
Potassium: 4.3 mEq/L (ref 3.5–5.1)
Sodium: 144 mEq/L (ref 135–145)

## 2011-06-30 LAB — HEPATIC FUNCTION PANEL
ALT: 18 U/L (ref 0–35)
AST: 23 U/L (ref 0–37)
Albumin: 3.8 g/dL (ref 3.5–5.2)
Alkaline Phosphatase: 93 U/L (ref 39–117)
Bilirubin, Direct: 0.1 mg/dL (ref 0.0–0.3)
Total Bilirubin: 0.3 mg/dL (ref 0.3–1.2)
Total Protein: 6.4 g/dL (ref 6.0–8.3)

## 2011-12-26 ENCOUNTER — Telehealth: Payer: Self-pay | Admitting: Internal Medicine

## 2011-12-26 NOTE — Telephone Encounter (Signed)
Patient is asking to switch from [facility] to you.  She said her husband,James,is your patient and they would like to see the same doctor.  She said her husband spoke to you and you said she could switch to you.

## 2011-12-26 NOTE — Telephone Encounter (Signed)
That is fine with me.

## 2011-12-26 NOTE — Telephone Encounter (Signed)
I notified patient.  Patient scheduled CPX on 02/09/12.

## 2011-12-26 NOTE — Telephone Encounter (Signed)
Fine with me.  Please change the PCP assignment if okay with Dr. Elbert Ewings.  Thanks.

## 2011-12-28 ENCOUNTER — Encounter: Payer: Medicare Other | Admitting: Internal Medicine

## 2012-02-09 ENCOUNTER — Ambulatory Visit (INDEPENDENT_AMBULATORY_CARE_PROVIDER_SITE_OTHER): Payer: Medicare Other | Admitting: Family Medicine

## 2012-02-09 ENCOUNTER — Encounter: Payer: Self-pay | Admitting: Family Medicine

## 2012-02-09 VITALS — BP 122/74 | HR 91 | Temp 98.1°F | Ht 67.0 in | Wt 177.0 lb

## 2012-02-09 DIAGNOSIS — G7000 Myasthenia gravis without (acute) exacerbation: Secondary | ICD-10-CM

## 2012-02-09 DIAGNOSIS — M810 Age-related osteoporosis without current pathological fracture: Secondary | ICD-10-CM

## 2012-02-09 DIAGNOSIS — Z0000 Encounter for general adult medical examination without abnormal findings: Secondary | ICD-10-CM

## 2012-02-09 DIAGNOSIS — F329 Major depressive disorder, single episode, unspecified: Secondary | ICD-10-CM

## 2012-02-09 DIAGNOSIS — R109 Unspecified abdominal pain: Secondary | ICD-10-CM

## 2012-02-09 LAB — POCT URINALYSIS DIPSTICK
Bilirubin, UA: NEGATIVE
Blood, UA: NEGATIVE
Glucose, UA: NEGATIVE
Ketones, UA: NEGATIVE
Leukocytes, UA: NEGATIVE
Nitrite, UA: NEGATIVE
Protein, UA: NEGATIVE
Spec Grav, UA: 1.015
Urobilinogen, UA: NEGATIVE
pH, UA: 6.5

## 2012-02-09 NOTE — Progress Notes (Signed)
I have personally reviewed the Medicare Annual Wellness questionnaire and have noted 1. The patient's medical and social history 2. Their use of alcohol, tobacco or illicit drugs 3. Their current medications and supplements 4. The patient's functional ability including ADL's, fall risks, home safety risks and hearing or visual             impairment. 5. Diet and physical activities 6. Evidence for depression or mood disorders  The patients weight, height, BMI have been recorded in the chart, and visual acuity per eye clinic.  I have made referrals, counseling and provided education to the patient based review of the above and I have provided the pt with a written personalized care plan for preventive services.  Pap done 2012.  Hysterectomy done ~1971.   Colonoscopy 2011 per Dr. Loreta Ave Td allergy Flu encouraged zosta and PNA vaccines up to date.  Lipids not checked as statin wouldn't be used even if lipids were high, due to dx of MG DXA not indicated as patient wouldn't want treatment if DXA were low.   Myasthenia gravis.  Per neuro.  Feeling well w/o weakness.  meds tolerated.    abd pain.  She was concerned about possible UTI but no burning and u/a is normal.  She does have h/o constipation.    Depression.  She would to taper her TCA.  Mood is 'fairly good.'  She contracts for safety.  No SI/HI.  She was worried about cognitive changes on that medicine, though she doesn't have them.  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  PMH and SH reviewed  ROS: See HPI, otherwise noncontributory.  Meds, vitals, and allergies reviewed.   GEN: nad, alert and oriented HEENT: mucous membranes moist NECK: supple w/o LA CV: rrr.  no murmur PULM: ctab, no inc wob ABD: soft, +bs EXT: no edema SKIN: no acute rash

## 2012-02-09 NOTE — Patient Instructions (Addendum)
Don't change your meds for MG.  Notify us with concerns. I would take a colace daily (100mg ) to soften your stools.  I would get a flu shot each fall.   Schedule a physical in 1 year.

## 2012-02-12 DIAGNOSIS — Z0000 Encounter for general adult medical examination without abnormal findings: Secondary | ICD-10-CM | POA: Insufficient documentation

## 2012-02-12 DIAGNOSIS — R109 Unspecified abdominal pain: Secondary | ICD-10-CM | POA: Insufficient documentation

## 2012-02-12 NOTE — Assessment & Plan Note (Signed)
Continue current meds. Doing well.  

## 2012-02-12 NOTE — Assessment & Plan Note (Signed)
She doesn't want further eval or tx.

## 2012-02-12 NOTE — Assessment & Plan Note (Addendum)
Unlikely to be UTI, possibly due to constipation.  Benign abd exam.  Will follow. Start stool softener.

## 2012-02-12 NOTE — Assessment & Plan Note (Signed)
Will attempt TCA taper.  Mood okay.

## 2012-05-21 ENCOUNTER — Other Ambulatory Visit: Payer: Self-pay | Admitting: Family Medicine

## 2012-05-21 DIAGNOSIS — Z1231 Encounter for screening mammogram for malignant neoplasm of breast: Secondary | ICD-10-CM

## 2012-06-12 ENCOUNTER — Ambulatory Visit: Payer: Medicare Other

## 2012-06-17 ENCOUNTER — Ambulatory Visit
Admission: RE | Admit: 2012-06-17 | Discharge: 2012-06-17 | Disposition: A | Discharge status: Home / Self Care | Payer: Medicare Other | Source: Ambulatory Visit | Attending: Family Medicine | Admitting: Family Medicine

## 2012-06-17 DIAGNOSIS — Z1231 Encounter for screening mammogram for malignant neoplasm of breast: Secondary | ICD-10-CM

## 2012-06-18 ENCOUNTER — Encounter: Payer: Self-pay | Admitting: *Deleted

## 2012-06-20 ENCOUNTER — Encounter: Payer: Self-pay | Admitting: Family Medicine

## 2012-06-20 ENCOUNTER — Ambulatory Visit (INDEPENDENT_AMBULATORY_CARE_PROVIDER_SITE_OTHER): Payer: Medicare Other | Admitting: Family Medicine

## 2012-06-20 VITALS — BP 104/76 | HR 88 | Temp 98.3°F | Wt 177.8 lb

## 2012-06-20 DIAGNOSIS — M549 Dorsalgia, unspecified: Secondary | ICD-10-CM

## 2012-06-20 LAB — POCT URINALYSIS DIPSTICK
Bilirubin, UA: NEGATIVE
Blood, UA: NEGATIVE
Glucose, UA: NEGATIVE
Ketones, UA: NEGATIVE
Leukocytes, UA: NEGATIVE
Nitrite, UA: NEGATIVE
Protein, UA: NEGATIVE
Spec Grav, UA: 1.02
Urobilinogen, UA: NEGATIVE
pH, UA: 6

## 2012-06-20 NOTE — Progress Notes (Signed)
Lower abd pain and back pain.  Had been travelling, riding in car long distance recently. Also walking steep hills in [place] during the trip.  No FCNAVD.  No dysuria.  No rash.  No injury, no falls.  No leg pain.  The pain started yesterday, better today.  Abd pain is resolved, mild B lower back pain (though improved today).  H/o constipation.    Meds, vitals, and allergies reviewed.   ROS: See HPI.  Otherwise, noncontributory.  nad ncat rrr ctab Back w/o midline pain C/o mild B lumbar paraspinal pain but not ttp  No rash Normal flex and ext at the waist abd soft, no ttp No cva pain BLE with normal strength and gait

## 2012-06-20 NOTE — Patient Instructions (Addendum)
Take care and call if the pain increases.

## 2012-06-20 NOTE — Assessment & Plan Note (Signed)
Improved, likely benign MSK strain.  U/a unremarkable.  F/u prn.  She agrees.

## 2012-09-13 ENCOUNTER — Ambulatory Visit (INDEPENDENT_AMBULATORY_CARE_PROVIDER_SITE_OTHER): Payer: Medicare Other | Admitting: Family Medicine

## 2012-09-13 ENCOUNTER — Encounter: Payer: Self-pay | Admitting: Family Medicine

## 2012-09-13 VITALS — BP 122/76 | HR 80 | Temp 97.8°F | Wt 180.0 lb

## 2012-09-13 DIAGNOSIS — Z23 Encounter for immunization: Secondary | ICD-10-CM

## 2012-09-13 DIAGNOSIS — R7402 Elevation of levels of lactic acid dehydrogenase (LDH): Secondary | ICD-10-CM

## 2012-09-13 DIAGNOSIS — R7401 Elevation of levels of liver transaminase levels: Secondary | ICD-10-CM

## 2012-09-13 LAB — HEPATIC FUNCTION PANEL
ALT: 17 U/L (ref 0–35)
AST: 22 U/L (ref 0–37)
Albumin: 4 g/dL (ref 3.5–5.2)
Alkaline Phosphatase: 89 U/L (ref 39–117)
Bilirubin, Direct: 0 mg/dL (ref 0.0–0.3)
Total Bilirubin: 0.7 mg/dL (ref 0.3–1.2)
Total Protein: 7.2 g/dL (ref 6.0–8.3)

## 2012-09-13 NOTE — Progress Notes (Signed)
Mild inc in ALT at a screening.  No h/o liver disease.  No RUQ pain.  Prev LFTs wnl.    Meds, vitals, and allergies reviewed.   ROS: See HPI.  Otherwise, noncontributory.  nad ncat Mmm rrr ctab abd soft, not ttp, RUQ not ttp Ext w/o edema

## 2012-09-13 NOTE — Patient Instructions (Addendum)
I would ask Dr. Sandria Manly about the mestinon.   Go to the lab on the way out.  We'll contact you with your lab report.

## 2012-09-14 DIAGNOSIS — R7401 Elevation of levels of liver transaminase levels: Secondary | ICD-10-CM | POA: Insufficient documentation

## 2012-09-14 NOTE — Assessment & Plan Note (Signed)
Mild, likely unremarkable, recheck LFTS wnl, wouldn't recheck or w/u.  Likely isolated false positive.  D/w pt.

## 2012-09-16 ENCOUNTER — Encounter: Payer: Self-pay | Admitting: *Deleted

## 2012-10-28 DIAGNOSIS — M7660 Achilles tendinitis, unspecified leg: Secondary | ICD-10-CM

## 2012-10-28 HISTORY — DX: Achilles tendinitis, unspecified leg: M76.60

## 2012-12-20 ENCOUNTER — Other Ambulatory Visit: Payer: Self-pay | Admitting: Neurology

## 2012-12-20 DIAGNOSIS — R413 Other amnesia: Secondary | ICD-10-CM

## 2012-12-20 DIAGNOSIS — G060 Intracranial abscess and granuloma: Secondary | ICD-10-CM

## 2012-12-25 ENCOUNTER — Ambulatory Visit
Admission: RE | Admit: 2012-12-25 | Discharge: 2012-12-25 | Disposition: A | Discharge status: Home / Self Care | Payer: Medicare Other | Source: Ambulatory Visit | Attending: Neurology | Admitting: Neurology

## 2012-12-25 DIAGNOSIS — R413 Other amnesia: Secondary | ICD-10-CM

## 2012-12-25 DIAGNOSIS — G060 Intracranial abscess and granuloma: Secondary | ICD-10-CM

## 2013-02-01 ENCOUNTER — Other Ambulatory Visit: Payer: Self-pay | Admitting: Family Medicine

## 2013-02-01 DIAGNOSIS — M810 Age-related osteoporosis without current pathological fracture: Secondary | ICD-10-CM

## 2013-02-01 DIAGNOSIS — Z13220 Encounter for screening for lipoid disorders: Secondary | ICD-10-CM

## 2013-02-01 DIAGNOSIS — Z8249 Family history of ischemic heart disease and other diseases of the circulatory system: Secondary | ICD-10-CM

## 2013-02-04 DIAGNOSIS — R413 Other amnesia: Secondary | ICD-10-CM

## 2013-02-05 ENCOUNTER — Other Ambulatory Visit: Payer: Self-pay

## 2013-02-05 MED ORDER — PYRIDOSTIGMINE BROMIDE 60 MG PO TABS: ORAL_TABLET | ORAL | Status: DC

## 2013-02-05 NOTE — Telephone Encounter (Signed)
Former Love patient assigned to Dr Willis  

## 2013-02-07 ENCOUNTER — Other Ambulatory Visit (INDEPENDENT_AMBULATORY_CARE_PROVIDER_SITE_OTHER): Payer: Medicare Other

## 2013-02-07 DIAGNOSIS — Z136 Encounter for screening for cardiovascular disorders: Secondary | ICD-10-CM

## 2013-02-07 DIAGNOSIS — M810 Age-related osteoporosis without current pathological fracture: Secondary | ICD-10-CM

## 2013-02-07 DIAGNOSIS — Z13220 Encounter for screening for lipoid disorders: Secondary | ICD-10-CM

## 2013-02-07 LAB — LIPID PANEL
Cholesterol: 173 mg/dL (ref 0–200)
HDL: 41.4 mg/dL (ref >39.00)
LDL Cholesterol: 110 mg/dL — ABNORMAL HIGH (ref 0–99)
Total CHOL/HDL Ratio: 4
Triglycerides: 110 mg/dL (ref 0.0–149.0)
VLDL: 22 mg/dL (ref 0.0–40.0)

## 2013-02-07 LAB — BASIC METABOLIC PANEL
BUN: 17 mg/dL (ref 6–23)
CO2: 25 mEq/L (ref 19–32)
Calcium: 8.7 mg/dL (ref 8.4–10.5)
Chloride: 107 mEq/L (ref 96–112)
Creatinine, Ser: 1 mg/dL (ref 0.4–1.2)
GFR: 58.77 mL/min — ABNORMAL LOW (ref >60.00)
Glucose, Bld: 90 mg/dL (ref 70–99)
Potassium: 4.4 mEq/L (ref 3.5–5.1)
Sodium: 140 mEq/L (ref 135–145)

## 2013-02-08 LAB — VITAMIN D 25 HYDROXY (VIT D DEFICIENCY, FRACTURES): Vit D, 25-Hydroxy: 31 ng/mL (ref 30–89)

## 2013-02-12 DIAGNOSIS — R413 Other amnesia: Secondary | ICD-10-CM

## 2013-02-14 ENCOUNTER — Encounter: Payer: Medicare Other | Admitting: Family Medicine

## 2013-02-18 ENCOUNTER — Encounter: Payer: Self-pay | Admitting: Family Medicine

## 2013-02-18 ENCOUNTER — Telehealth: Payer: Self-pay | Admitting: Neurology

## 2013-02-18 ENCOUNTER — Ambulatory Visit (INDEPENDENT_AMBULATORY_CARE_PROVIDER_SITE_OTHER): Payer: Medicare Other | Admitting: Family Medicine

## 2013-02-18 VITALS — BP 126/72 | HR 65 | Temp 97.7°F | Ht 67.0 in | Wt 182.0 lb

## 2013-02-18 DIAGNOSIS — Z1331 Encounter for screening for depression: Secondary | ICD-10-CM

## 2013-02-18 DIAGNOSIS — M810 Age-related osteoporosis without current pathological fracture: Secondary | ICD-10-CM

## 2013-02-18 DIAGNOSIS — Z0000 Encounter for general adult medical examination without abnormal findings: Secondary | ICD-10-CM

## 2013-02-18 NOTE — Telephone Encounter (Signed)
I called patient. The patient has completed neuropsychological testing that showed evidence of minimum cognitive impairment. The patient did have some difficulty remembering names for people. Overall, the cognitive impairment was not severe. I discussed this with the patient.

## 2013-02-18 NOTE — Patient Instructions (Addendum)
See Shirlee Limerick about your referral before you leave today. I would get a flu shot each fall.   Please call about your mammogram in the fall.  Take care.

## 2013-02-18 NOTE — Progress Notes (Signed)
I have personally reviewed the Medicare Annual Wellness questionnaire and have noted 1. The patient's medical and social history 2. Their use of alcohol, tobacco or illicit drugs 3. Their current medications and supplements 4. The patient's functional ability including ADL's, fall risks, home safety risks and hearing or visual             impairment. 5. Diet and physical activities 6. Evidence for depression or mood disorders  The patients weight, height, BMI have been recorded in the chart and visual acuity is per eye clinic.  I have made referrals, counseling and provided education to the patient based review of the above and I have provided the pt with a written personalized care plan for preventive services.  See scanned forms.  Routine anticipatory guidance given to patient.  See health maintenance. Flu 2013 Shingles 2008 PNA 2010 Tetanus n/a due to allergy.  Colonoscopy 2011 Breast cancer screening done 05/2012 Advance directive d/w pt.  Husband designated if incapacitated.  Cognitive function addressed- see scanned forms- normal on brief testing today and followed by neuro per patient report.  DEXA d/w pt.  Will refer.   MG per neuro.  No weakness and doing well.  Labs d/w pt.  No intervention needed.    PMH and SH reviewed  Meds, vitals, and allergies reviewed.   ROS: See HPI.  Otherwise negative.    GEN: nad, alert and oriented HEENT: mucous membranes moist NECK: supple w/o LA CV: rrr. PULM: ctab, no inc wob ABD: soft, +bs EXT: no edema SKIN: no acute rash

## 2013-02-19 ENCOUNTER — Ambulatory Visit (INDEPENDENT_AMBULATORY_CARE_PROVIDER_SITE_OTHER)
Admission: RE | Admit: 2013-02-19 | Discharge: 2013-02-19 | Disposition: A | Discharge status: Home / Self Care | Payer: Medicare Other | Source: Ambulatory Visit | Attending: Family Medicine | Admitting: Family Medicine

## 2013-02-19 DIAGNOSIS — M810 Age-related osteoporosis without current pathological fracture: Secondary | ICD-10-CM

## 2013-02-19 NOTE — Assessment & Plan Note (Signed)
See scanned forms.  Routine anticipatory guidance given to patient.  See health maintenance. Flu 2013 Shingles 2008 PNA 2010 Tetanus n/a due to allergy.  Colonoscopy 2011 Breast cancer screening done 05/2012 Advance directive d/w pt.  Husband designated if incapacitated.  Cognitive function addressed- see scanned forms- normal on brief testing today and followed by neuro per patient report.  DEXA d/w pt.  Will refer.   MG per neuro.  No weakness and doing well.

## 2013-03-25 ENCOUNTER — Encounter: Payer: Self-pay | Admitting: Family Medicine

## 2013-03-25 ENCOUNTER — Ambulatory Visit (INDEPENDENT_AMBULATORY_CARE_PROVIDER_SITE_OTHER): Payer: Medicare Other | Admitting: Family Medicine

## 2013-03-25 VITALS — BP 120/60 | HR 67 | Temp 98.0°F | Ht 67.0 in | Wt 181.5 lb

## 2013-03-25 DIAGNOSIS — R300 Dysuria: Secondary | ICD-10-CM | POA: Insufficient documentation

## 2013-03-25 DIAGNOSIS — N390 Urinary tract infection, site not specified: Secondary | ICD-10-CM

## 2013-03-25 LAB — POCT URINALYSIS DIPSTICK
Bilirubin, UA: NEGATIVE
Glucose, UA: NEGATIVE
Ketones, UA: NEGATIVE
Nitrite, UA: POSITIVE
Protein, UA: 30
Spec Grav, UA: 1.025
Urobilinogen, UA: NEGATIVE
pH, UA: 7

## 2013-03-25 MED ORDER — CEPHALEXIN 500 MG PO TABS: 500.0000 mg | ORAL_TABLET | Freq: Three times a day (TID) | ORAL | Status: DC

## 2013-03-25 NOTE — Patient Instructions (Addendum)
Complete antibiotics. Push fluids. We will call you with urine culture report.  Call if not improved by end of antibiotics or if fever /not tolerating antibioics.

## 2013-03-25 NOTE — Progress Notes (Signed)
  Subjective:    Patient ID: [name] [name], female    DOB: 02/04/1936, 77 y.o.   MRN: [id number]  Urinary Tract Infection  This is a new problem. The current episode started in the past 7 days (saw urgent care last week...given 5 days of macrobid. No improvement with that antibiotic.). The problem has been gradually worsening. The quality of the pain is described as burning. The pain is mild. There has been no fever. She is not sexually active. There is no history of pyelonephritis. Associated symptoms include frequency and urgency. Pertinent negatives include no chills, flank pain, hematuria, nausea, possible pregnancy or vomiting. Associated symptoms comments: Suprapubic pain. She has tried nothing for the symptoms. The treatment provided no relief. There is no history of catheterization, kidney stones, recurrent UTIs, a single kidney, urinary stasis or a urological procedure. HAs not had UTI in last several years.      Review of Systems  Constitutional: Negative for chills.  Gastrointestinal: Negative for nausea and vomiting.  Genitourinary: Positive for urgency and frequency. Negative for hematuria and flank pain.       Objective:   Physical Exam  Constitutional: Vital signs are normal. She appears well-developed and well-nourished. She is cooperative.  Non-toxic appearance. She does not appear ill. No distress.  HENT:  Head: Normocephalic.  Right Ear: Hearing, tympanic membrane, external ear and ear canal normal. Tympanic membrane is not erythematous, not retracted and not bulging.  Left Ear: Hearing, tympanic membrane, external ear and ear canal normal. Tympanic membrane is not erythematous, not retracted and not bulging.  Nose: No mucosal edema or rhinorrhea. Right sinus exhibits no maxillary sinus tenderness and no frontal sinus tenderness. Left sinus exhibits no maxillary sinus tenderness and no frontal sinus tenderness.  Mouth/Throat: Uvula is midline, oropharynx is clear and moist  and mucous membranes are normal.  Eyes: Conjunctivae, EOM and lids are normal. Pupils are equal, round, and reactive to light. No foreign bodies found.  Neck: Trachea normal and normal range of motion. Neck supple. Carotid bruit is not present. No mass and no thyromegaly present.  Cardiovascular: Normal rate, regular rhythm, S1 normal, S2 normal, normal heart sounds, intact distal pulses and normal pulses.  Exam reveals no gallop and no friction rub.   No murmur heard. Pulmonary/Chest: Effort normal and breath sounds normal. Not tachypneic. No respiratory distress. She has no decreased breath sounds. She has no wheezes. She has no rhonchi. She has no rales.  Abdominal: Soft. Normal appearance and bowel sounds are normal. There is no hepatosplenomegaly. There is tenderness in the suprapubic area. There is no rigidity, no rebound, no guarding and no CVA tenderness.  Neurological: She is alert.  Skin: Skin is warm, dry and intact. No rash noted.  Psychiatric: Her speech is normal and behavior is normal. Judgment and thought content normal. Her mood appears not anxious. Cognition and memory are normal. She does not exhibit a depressed mood.          Assessment & Plan:

## 2013-03-25 NOTE — Assessment & Plan Note (Addendum)
Cannot take cipro due to increase risk of worsening myasthenia gravis, allergy to sulfa. Likely resistant to  macrobid she already completed.

## 2013-03-27 LAB — URINE CULTURE: Colony Count: >=100000

## 2013-04-04 ENCOUNTER — Encounter: Payer: Self-pay | Admitting: Family Medicine

## 2013-04-04 ENCOUNTER — Ambulatory Visit (INDEPENDENT_AMBULATORY_CARE_PROVIDER_SITE_OTHER): Payer: Medicare Other | Admitting: Family Medicine

## 2013-04-04 VITALS — BP 128/80 | HR 68 | Temp 97.7°F | Wt 181.5 lb

## 2013-04-04 DIAGNOSIS — R300 Dysuria: Secondary | ICD-10-CM

## 2013-04-04 DIAGNOSIS — N390 Urinary tract infection, site not specified: Secondary | ICD-10-CM

## 2013-04-04 LAB — POCT URINALYSIS DIPSTICK
Bilirubin, UA: NEGATIVE
Blood, UA: NEGATIVE
Glucose, UA: NEGATIVE
Ketones, UA: NEGATIVE
Nitrite, UA: NEGATIVE
Protein, UA: NEGATIVE
Spec Grav, UA: 1.01
Urobilinogen, UA: NEGATIVE
pH, UA: 7.5

## 2013-04-04 MED ORDER — CEPHALEXIN 500 MG PO TABS: 500.0000 mg | ORAL_TABLET | Freq: Three times a day (TID) | ORAL | Status: DC

## 2013-04-04 NOTE — Assessment & Plan Note (Signed)
ucx reviewed.  Would extend the keflex based on ucx.  Nontoxic. Likely incompletely treated, though 7 days is usually more than adequate. D/w pt.  She agrees.

## 2013-04-04 NOTE — Patient Instructions (Addendum)
Drink plenty of water and start the antibiotics today.  This should gradually resolve.  If not, then notify us.  Take care.

## 2013-04-04 NOTE — Progress Notes (Signed)
She had pansensitive citrobacter prev, took keflex.  It didn't fully resolve.  In the meantime, she had more burning and discomfort.  She doesn't feel bad except for the lower abdominal discomfort. No fevers.  She doesn't have vaginal discharge. Some nausea.   Meds, vitals, and allergies reviewed.   ROS: See HPI.  Otherwise, noncontributory.  GEN: nad, alert and oriented HEENT: mucous membranes moist NECK: supple CV: rrr.  PULM: ctab, no inc wob ABD: soft, +bs, suprapubic area not tender EXT: no edema SKIN: no acute rash BACK: no CVA pain

## 2013-04-18 ENCOUNTER — Ambulatory Visit (INDEPENDENT_AMBULATORY_CARE_PROVIDER_SITE_OTHER): Payer: Medicare Other | Admitting: Family Medicine

## 2013-04-18 ENCOUNTER — Encounter: Payer: Self-pay | Admitting: Family Medicine

## 2013-04-18 VITALS — BP 122/58 | HR 73 | Temp 97.8°F | Wt 182.5 lb

## 2013-04-18 DIAGNOSIS — R300 Dysuria: Secondary | ICD-10-CM

## 2013-04-18 LAB — POCT URINALYSIS DIPSTICK
Bilirubin, UA: NEGATIVE
Blood, UA: NEGATIVE
Glucose, UA: NEGATIVE
Ketones, UA: NEGATIVE
Leukocytes, UA: NEGATIVE
Nitrite, UA: NEGATIVE
Protein, UA: NEGATIVE
Spec Grav, UA: 1.02
Urobilinogen, UA: NEGATIVE
pH, UA: 6

## 2013-04-18 NOTE — Patient Instructions (Signed)
We'll contact you with your lab report. Drink mainly water, stop the vesicare, and give me an update on Monday.

## 2013-04-20 LAB — URINE CULTURE: Colony Count: 2000

## 2013-04-20 NOTE — Progress Notes (Signed)
Recently with Ucx positive, treated.  Still with urinary sx, seen by uro.  Started on vesicare.  U/a unremarkable at uro clinic.  Not improved on vesicare.  Stopped it 2 days ago with some partial improvement.  U/a again unremarkable today.  Some burning with urination noted recently.  No fevers.    Meds, vitals, and allergies reviewed.   ROS: See HPI.  Otherwise, noncontributory.  nad ncat Mmm rrr ctab abd soft, not ttp

## 2013-04-20 NOTE — Assessment & Plan Note (Signed)
ucx neg, sx not improved with vesicare.  She'll stop vesicare over the weekend.  Will update [name] on Monday.  If sx continue, consider another med for OAB vs uro f/u.

## 2013-05-28 ENCOUNTER — Other Ambulatory Visit: Payer: Self-pay

## 2013-05-28 DIAGNOSIS — Z1231 Encounter for screening mammogram for malignant neoplasm of breast: Secondary | ICD-10-CM

## 2013-06-10 ENCOUNTER — Encounter: Payer: Self-pay | Admitting: Neurology

## 2013-06-10 DIAGNOSIS — R413 Other amnesia: Secondary | ICD-10-CM

## 2013-06-10 DIAGNOSIS — G309 Alzheimer's disease, unspecified: Secondary | ICD-10-CM

## 2013-06-10 DIAGNOSIS — G060 Intracranial abscess and granuloma: Secondary | ICD-10-CM | POA: Insufficient documentation

## 2013-06-10 DIAGNOSIS — F0280 Dementia in other diseases classified elsewhere without behavioral disturbance: Secondary | ICD-10-CM | POA: Insufficient documentation

## 2013-06-10 DIAGNOSIS — A429 Actinomycosis, unspecified: Secondary | ICD-10-CM | POA: Insufficient documentation

## 2013-06-10 HISTORY — DX: Dementia in other diseases classified elsewhere, unspecified severity, without behavioral disturbance, psychotic disturbance, mood disturbance, and anxiety: F02.80

## 2013-06-10 HISTORY — DX: Dementia in other diseases classified elsewhere without behavioral disturbance: G30.9

## 2013-06-17 ENCOUNTER — Ambulatory Visit (INDEPENDENT_AMBULATORY_CARE_PROVIDER_SITE_OTHER): Payer: Medicare Other | Admitting: Neurology

## 2013-06-17 ENCOUNTER — Encounter: Payer: Self-pay | Admitting: Neurology

## 2013-06-17 VITALS — BP 133/79 | HR 63 | Wt 183.0 lb

## 2013-06-17 DIAGNOSIS — R413 Other amnesia: Secondary | ICD-10-CM

## 2013-06-17 DIAGNOSIS — G7000 Myasthenia gravis without (acute) exacerbation: Secondary | ICD-10-CM

## 2013-06-17 NOTE — Progress Notes (Signed)
Reason for visit: Myasthenia gravis  [name] [name] is an 77 y.o. female  History of present illness:  [name] [name] is a 77 year old right-handed white female with a history of myasthenia gravis and a mild memory disturbance. The patient reports that she is doing relatively well just on Mestinon. The patient will occasionally have some blurring of vision, and she feels scratchy with the eyes, and has some tearing of the eyes at times. The patient reports some nocturnal leg cramps off and on. The patient denies any problems with chewing or swallowing, and she denies problems with weakness of the extremities. The patient does have mild memory issue, but she believes that this is stable. The patient drives a car, and she is able to read without difficulty. The patient denies any problems with diarrhea, but she occasionally has urinary incontinence. The patient returns for an evaluation.  Past Medical History  Diagnosis Date  . Myasthenia gravis   . GERD (gastroesophageal reflux disease)   . Depression   . Nocardia infection     h/o brain abscess  . Anxiety   . Fibromyalgia   . Osteoporosis   . Memory loss     Past Surgical History  Procedure Laterality Date  . Appendectomy    . Cholecystectomy    . Abdominal hysterectomy    . Tonsillectomy and adenoidectomy    . Breast biopsy    . Bladder repair      Family History  Problem Relation Age of Onset  . Hypertension Brother   . Heart disease Brother   . Heart disease Paternal Uncle   . Hypertension Paternal Uncle   . Diabetes Neg Hx   . Colon cancer Neg Hx   . Breast cancer Mother   . Cancer Mother   . Cirrhosis Father     Social history:  reports that she has never smoked. She has never used smokeless tobacco. She reports that she does not drink alcohol or use illicit drugs.  Allergies:  Allergies  Allergen Reactions  . Alendronate Sodium     REACTION: upset stomach  . Sulfa Antibiotics     rash  . Tetanus Toxoids    rash  . Vesicare [Solifenacin]     dysuria    Medications:  Current Outpatient Prescriptions on File Prior to Visit  Medication Sig Dispense Refill  . cholecalciferol (VITAMIN D) 1000 UNITS tablet Take 1,000 Units by mouth daily.      . Multiple Vitamin (MULTIVITAMIN) tablet Take 1 tablet by mouth daily.      Marland Kitchen pyridostigmine (MESTINON) 60 MG tablet Take two tabs po in am, one tab at 2pm and one tab at 6pm  360 tablet  3   No current facility-administered medications on file prior to visit.    ROS:  Out of a complete 14 system review of symptoms, the patient complains only of the following symptoms, and all other reviewed systems are negative.  Ringing in the ears Moles Bowel incontinence Memory loss  Blood pressure 133/79, pulse 63, weight 183 lb (83.008 kg).  Physical Exam  General: The patient is alert and cooperative at the time of the examination.  Skin: No significant peripheral edema is noted.   Neurologic Exam  Mental status: Mini-Mental status examination done today shows a total score of 25/30.  Cranial nerves: Facial symmetry is present. Speech is normal, no aphasia or dysarthria is noted. Extraocular movements are full. Visual fields are full. With superior gaze for 1 minute, mild ptosis  of the left eye develops, no divergence of gaze is seen. The patient does not report double vision.  Motor: The patient has good strength in all 4 extremities. The patient has no fatigable weakness of the deltoid muscles with the arms outstretched 1 minute.  Coordination: The patient has good finger-nose-finger and heel-to-shin bilaterally.  Gait and station: The patient has a normal gait. Tandem gait is normal. Romberg is negative. No drift is seen.  Reflexes: Deep tendon reflexes are symmetric.   Assessment/Plan:  1. Myasthenia gravis  2. Memory disturbance  3. Nocturnal leg cramps  The patient will continue her Mestinon for now taking 120 mg in the morning, and  60 mg at 2 PM, and 6 PM. The patient has done well. The memory issues appear to be stable. The patient followup in 6-8 months.  Marlan Palau MD 06/17/2013 12:36 PM  [facility] [address] [place], [place] [address]  Phone [phone] Fax [phone]

## 2013-06-18 ENCOUNTER — Ambulatory Visit: Payer: Medicare Other

## 2013-06-19 ENCOUNTER — Ambulatory Visit
Admission: RE | Admit: 2013-06-19 | Discharge: 2013-06-19 | Disposition: A | Discharge status: Home / Self Care | Payer: Medicare Other | Source: Ambulatory Visit

## 2013-06-19 DIAGNOSIS — Z1231 Encounter for screening mammogram for malignant neoplasm of breast: Secondary | ICD-10-CM

## 2013-06-20 ENCOUNTER — Encounter: Payer: Self-pay | Admitting: *Deleted

## 2013-07-14 ENCOUNTER — Encounter: Payer: Self-pay | Admitting: Podiatry

## 2013-07-30 ENCOUNTER — Ambulatory Visit (INDEPENDENT_AMBULATORY_CARE_PROVIDER_SITE_OTHER): Payer: Medicare Other | Admitting: Podiatry

## 2013-07-30 ENCOUNTER — Encounter: Payer: Self-pay | Admitting: Podiatry

## 2013-07-30 DIAGNOSIS — M722 Plantar fascial fibromatosis: Secondary | ICD-10-CM

## 2013-07-30 NOTE — Patient Instructions (Signed)
Call if pain reoccurs

## 2013-07-30 NOTE — Progress Notes (Signed)
Subjective:     Patient ID: [name] [name], female   DOB: 05/01/1936, 77 y.o.   MRN: [id number]  HPIpatient is improving but still painful at the end of the day   Review of Systems no change review of systems from previous visit     Objective:   Physical Exam physical exam is normal     Assessment:     Patient's pain continues to improve at this time    Plan:     5.0, 15, 2500

## 2013-08-26 ENCOUNTER — Telehealth: Payer: Self-pay | Admitting: Neurology

## 2013-08-26 NOTE — Telephone Encounter (Signed)
I called patient. Over the last week or so, the patient has had increased ptosis of the left eye. No double vision. The patient is on Mestinon taking 60 mg, 2 tablets at 10 AM, 1 tablet 2, one tablet at 6 PM. The patient may go up to 2 tablets 3 times daily if she does not have diarrhea, sweats, or increased weakness. If the ptosis continues, prednisone may need to be added.

## 2013-10-29 ENCOUNTER — Ambulatory Visit (INDEPENDENT_AMBULATORY_CARE_PROVIDER_SITE_OTHER): Payer: Medicare Other | Admitting: Family Medicine

## 2013-10-29 ENCOUNTER — Encounter: Payer: Self-pay | Admitting: Family Medicine

## 2013-10-29 VITALS — BP 128/76 | HR 60 | Temp 97.5°F | Ht 67.0 in | Wt 182.8 lb

## 2013-10-29 DIAGNOSIS — R300 Dysuria: Secondary | ICD-10-CM

## 2013-10-29 DIAGNOSIS — N390 Urinary tract infection, site not specified: Secondary | ICD-10-CM

## 2013-10-29 LAB — POCT URINALYSIS DIPSTICK
Bilirubin, UA: NEGATIVE
Glucose, UA: NEGATIVE
Ketones, UA: NEGATIVE
Nitrite, UA: NEGATIVE
Spec Grav, UA: 1.02
Urobilinogen, UA: 0.2
pH, UA: 6

## 2013-10-29 MED ORDER — CEPHALEXIN 500 MG PO TABS: 500.0000 mg | ORAL_TABLET | Freq: Three times a day (TID) | ORAL | Status: DC

## 2013-10-29 NOTE — Progress Notes (Signed)
SUBJECTIVE: [name] [name] is a 77 y.o. female who complains of urinary frequency, urgency,  and dysuria x 7 days, without flank pain, fever, chills, or abnormal vaginal discharge or bleeding.   Patient Active Problem List   Diagnosis Date Noted  . UTI (urinary tract infection) 10/29/2013  . Memory loss 06/10/2013  . Intracranial abscess 06/10/2013  . Actinomycotic infection of unspecified site 06/10/2013  . Dysuria 03/25/2013  . Achilles tendonitis 10/28/2012  . Transaminitis 09/14/2012  . Back pain 06/20/2012  . Medicare annual wellness visit, subsequent 02/12/2012  . Abdominal pain, bilateral lower quadrant 02/12/2012  . FIBROMYALGIA 05/10/2010  . OSTEOPOROSIS 05/10/2010  . ANXIETY 11/12/2009  . ALLERGIC RHINITIS 10/02/2009  . DEPRESSION 02/09/2009  . MYASTHENIA GRAVIS WITHOUT EXACERBATION 02/09/2009  . GERD 02/09/2009   Past Medical History  Diagnosis Date  . Myasthenia gravis   . GERD (gastroesophageal reflux disease)   . Depression   . Nocardia infection     h/o brain abscess  . Anxiety   . Fibromyalgia   . Osteoporosis   . Memory loss   . Achilles tendonitis 10/28/2012   Past Surgical History  Procedure Laterality Date  . Appendectomy    . Cholecystectomy    . Abdominal hysterectomy    . Tonsillectomy and adenoidectomy    . Breast biopsy    . Bladder repair     History  Substance Use Topics  . Smoking status: Never Smoker   . Smokeless tobacco: Never Used  . Alcohol Use: No   Family History  Problem Relation Age of Onset  . Hypertension Brother   . Heart disease Brother   . Heart disease Paternal Uncle   . Hypertension Paternal Uncle   . Diabetes Neg Hx   . Colon cancer Neg Hx   . Breast cancer Mother   . Cancer Mother   . Cirrhosis Father    Allergies  Allergen Reactions  . Alendronate Sodium     REACTION: upset stomach  . Sulfa Antibiotics     rash  . Tetanus Toxoids     rash  . Vesicare [Solifenacin]     dysuria   Current Outpatient  Prescriptions on File Prior to Visit  Medication Sig Dispense Refill  . cholecalciferol (VITAMIN D) 1000 UNITS tablet Take 1,000 Units by mouth daily.      . Multiple Vitamin (MULTIVITAMIN) tablet Take 1 tablet by mouth daily.      Marland Kitchen nystatin-triamcinolone ointment (MYCOLOG) Apply 0.1 application topically as needed.      . pyridostigmine (MESTINON) 60 MG tablet Take 120 mg by mouth 3 (three) times daily.       No current facility-administered medications on file prior to visit.   The PMH, PSH, Social History, Family History, Medications, and allergies have been reviewed in [facility], and have been updated if relevant.   OBJECTIVE:  BP 128/76  Pulse 60  Temp(Src) 97.5 F (36.4 C) (Oral)  Ht 5\' 7"  (1.702 m)  Wt 182 lb 12 oz (82.895 kg)  BMI 28.62 kg/m2  SpO2 98% Appears well, in no apparent distress.  Vital signs are normal. The abdomen is soft without tenderness, guarding, mass, rebound or organomegaly. No CVA tenderness or inguinal adenopathy noted. Urine dipstick shows positive for leukocytes, RBCs.    ASSESSMENT: UTI uncomplicated without evidence of pyelonephritis  PLAN: Treatment per orders - keflex 500 mg three times daily x 7 days, send for cx. also push fluids, may use Pyridium OTC prn. Call or return to clinic  prn if these symptoms worsen or fail to improve as anticipated.

## 2013-10-29 NOTE — Progress Notes (Signed)
Pre-visit discussion using our clinic review tool. No additional management support is needed unless otherwise documented below in the visit note.  

## 2013-10-29 NOTE — Patient Instructions (Signed)
Good to see you. Happy  New year. You do have a urinary tract infection. Please take cephalexin as directed. Drink plenty of fluids.  Call us with an update if no improvement.

## 2013-11-01 LAB — URINE CULTURE: Colony Count: >=100000

## 2013-12-03 ENCOUNTER — Telehealth: Payer: Self-pay | Admitting: Nurse Practitioner

## 2013-12-03 NOTE — Telephone Encounter (Signed)
Having problems with eyes--stinging, burning, eyes do not want to stay open--has been treated here before for myasthemia but has gotten worse--please call

## 2013-12-03 NOTE — Telephone Encounter (Signed)
I called pt. She is having problems recently with ptosis.   Taking her mestinon 2 tabs (60mg )  At 1000, 2 tabs 1300 and 0ne at 1800.  She stated that she is having burning/stinging last 2 days.  Moved up appt to tomorrow.

## 2013-12-04 ENCOUNTER — Encounter: Payer: Self-pay | Admitting: Nurse Practitioner

## 2013-12-04 ENCOUNTER — Ambulatory Visit (INDEPENDENT_AMBULATORY_CARE_PROVIDER_SITE_OTHER): Payer: Medicare Other | Admitting: Nurse Practitioner

## 2013-12-04 VITALS — BP 131/69 | HR 54 | Ht 66.0 in | Wt 184.0 lb

## 2013-12-04 DIAGNOSIS — G7000 Myasthenia gravis without (acute) exacerbation: Secondary | ICD-10-CM

## 2013-12-04 DIAGNOSIS — R413 Other amnesia: Secondary | ICD-10-CM

## 2013-12-04 MED ORDER — PYRIDOSTIGMINE BROMIDE 60 MG PO TABS: 120.0000 mg | ORAL_TABLET | Freq: Three times a day (TID) | ORAL | Status: DC

## 2013-12-04 NOTE — Progress Notes (Signed)
I have read the note, and I agree with the clinical assessment and plan.  Zannah Melucci KEITH   

## 2013-12-04 NOTE — Progress Notes (Signed)
[facility]  PATIENT: [name] [name] DOB: 10/19/1936   REASON FOR VISIT: Myasthenia gravis   HISTORY OF PRESENT ILLNESS: Ms. [name] [name], 78 year old female returns for followup. She was last seen in this office by Dr. Anne Hahn 06/17/2013 and is a previous patient of Dr. Sandria Manly. She has a history of mild memory disturbance and myasthenia gravis. She called into the office yesterday stating she had increased ptosis of the left eye. No double vision. No blurred vision. Denies any difficulty with chewing or swallowing and she denies any difficulty with weakness of the extremities. Her memory loss is stable, she continues to drive a car and be fairly active. She denies diarrhea on the Mestinon. She had called to the office back in October and spoke to Dr. Anne Hahn about increased ptosis at that time. She was asked to increase her medication of the Mestinon to 2 tabs 3 times daily however she only increased to 5 tablets daily and sometimes she would forget that extra dose several times a week. She returns for reevaluation.   HISTORY:of myasthenia gravis and a mild memory disturbance. The patient reports that she is doing relatively well just on Mestinon. The patient will occasionally have some blurring of vision, and she feels scratchy with the eyes, and has some tearing of the eyes at times. The patient reports some nocturnal leg cramps off and on. The patient denies any problems with chewing or swallowing, and she denies problems with weakness of the extremities. The patient does have mild memory issue, but she believes that this is stable. The patient drives a car, and she is able to read without difficulty. The patient denies any problems with diarrhea, but she occasionally has urinary incontinence. The patient returns for an evaluation  REVIEW OF SYSTEMS: Full 14 system review of systems performed and notable only for those listed, all others are neg:  Constitutional: N/A  Cardiovascular: N/A   Ear/Nose/Throat: N/A  Skin: N/A  Eyes: Ptosis left mostly in the evenings, light sensitivity Respiratory: N/A  Gastroitestinal: N/A  Genitourinary: Urinary incontinence Hematology/Lymphatic: N/A  Endocrine: N/A Musculoskeletal:N/A  Allergy/Immunology: N/A  Neurological: N/A Psychiatric: N/A   ALLERGIES: Allergies  Allergen Reactions  . Alendronate Sodium     REACTION: upset stomach  . Sulfa Antibiotics     rash  . Tetanus Toxoids     rash  . Vesicare [Solifenacin]     dysuria    HOME MEDICATIONS: Outpatient Prescriptions Prior to Visit  Medication Sig Dispense Refill  . Multiple Vitamin (MULTIVITAMIN) tablet Take 1 tablet by mouth daily. Multiple vit  With d      . nystatin-triamcinolone ointment (MYCOLOG) Apply 0.1 application topically as needed.      . pyridostigmine (MESTINON) 60 MG tablet Take 120 mg by mouth 3 (three) times daily.      . Cephalexin 500 MG tablet Take 1 tablet (500 mg total) by mouth 3 (three) times daily.  21 tablet  0  . cholecalciferol (VITAMIN D) 1000 UNITS tablet Take 1,000 Units by mouth daily.       No facility-administered medications prior to visit.    PAST MEDICAL HISTORY: Past Medical History  Diagnosis Date  . Myasthenia gravis   . GERD (gastroesophageal reflux disease)   . Depression   . Nocardia infection     h/o brain abscess  . Anxiety   . Fibromyalgia   . Osteoporosis   . Memory loss   . Achilles tendonitis 10/28/2012    PAST SURGICAL HISTORY:  Past Surgical History  Procedure Laterality Date  . Appendectomy    . Cholecystectomy    . Abdominal hysterectomy    . Tonsillectomy and adenoidectomy    . Breast biopsy    . Bladder repair      FAMILY HISTORY: Family History  Problem Relation Age of Onset  . Hypertension Brother   . Heart disease Brother   . Heart disease Paternal Uncle   . Hypertension Paternal Uncle   . Diabetes Neg Hx   . Colon cancer Neg Hx   . Breast cancer Mother   . Cancer Mother   .  Cirrhosis Father     SOCIAL HISTORY: History   Social History  . Marital Status: Married    Spouse Name: Psychologist, prison and probation servicesJB-James Baxter     Number of Children: 3  . Years of Education: COLLEGE   Occupational History  . retired Paediatric nursemanager of school cafeteria     2000  . RETIRED [employer]    Social History Main Topics  . Smoking status: Never Smoker   . Smokeless tobacco: Never Used  . Alcohol Use: No  . Drug Use: No  . Sexual Activity: Not on file   Other Topics Concern  . Not on file   Social History Narrative   Married 1964   3 kids, local   Retired from [employer].      PHYSICAL EXAM  Filed Vitals:   12/04/13 1043  BP: 131/69  Pulse: 54  Height: 5\' 6"  (1.676 m)  Weight: 184 lb (83.462 kg)   Body mass index is 29.71 kg/(m^2).  Generalized: Well developed, in no acute distress  Head: normocephalic and atraumatic,. Oropharynx benign  Neck: Supple, no carotid bruits  Cardiac: Regular rate rhythm, no murmur  Musculoskeletal: No deformity   Neurological examination   Mentation: Alert oriented to time, place, history taking. Follows all commands speech and language fluent. MMSE 27/30. AFT 20.  Cranial nerve II-XII: Pupils were equal round reactive to light extraocular movements were full, visual field were full on confrontational test. With superior gaze for 1 minute, mild ptosis of the left develops, no divergence of gaze is seen and she does not report double vision .Facial sensation and strength were normal. hearing was intact to finger rubbing bilaterally. Uvula tongue midline. head turning and shoulder shrug were normal and symmetric.Tongue protrusion into cheek strength was normal. Motor: normal bulk and tone, full strength in the BUE, BLE, the patient has no fatigable weakness of the arms or legs after outstretched arms for 1 minute and 20 steps up and down each foot without difficulty Coordination: finger-nose-finger, heel-to-shin bilaterally, no  dysmetria Reflexes: Brachioradialis 2/2, biceps 2/2, triceps 2/2, patellar 2/2, Achilles 2/2, plantar responses were flexor bilaterally. Gait and Station: Rising up from seated position without assistance, normal stance,  moderate stride, good arm swing, smooth turning, able to perform tiptoe, and heel walking without difficulty. Tandem gait is steady  DIAGNOSTIC DATA (LABS, IMAGING, TESTING) -    ASSESSMENT AND PLAN  78 y.o. year old female  has a past medical history of Myasthenia gravis;  Memory loss; here to followup.   Discussed with Dr. Anne HahnWillis. Increase Mestinon to 2 tablets 3 times a day as long as you're not having diarrhea sweating or increased weakness Will renew medication Followup in 2 months, may need to add Prednisone at later date.  Nilda RiggsNancy Carolyn Martin, [facility] CenterGNP, [facility] LPBC, APRN  [facility] [address], [address] [place], [place] [address] [phone]

## 2013-12-04 NOTE — Patient Instructions (Signed)
Increase Mestinon to 2 tablets 3 times a day as long as you're not having diarrhea sweating or increased weakness Will renew medication Followup in 2 months

## 2013-12-08 ENCOUNTER — Encounter: Payer: Self-pay | Admitting: Family Medicine

## 2013-12-08 ENCOUNTER — Ambulatory Visit (INDEPENDENT_AMBULATORY_CARE_PROVIDER_SITE_OTHER): Payer: Medicare Other | Admitting: Family Medicine

## 2013-12-08 VITALS — BP 124/74 | HR 60 | Temp 97.4°F | Wt 187.8 lb

## 2013-12-08 DIAGNOSIS — N390 Urinary tract infection, site not specified: Secondary | ICD-10-CM

## 2013-12-08 DIAGNOSIS — R300 Dysuria: Secondary | ICD-10-CM

## 2013-12-08 LAB — POCT URINALYSIS DIPSTICK
Bilirubin, UA: NEGATIVE
Blood, UA: NEGATIVE
Glucose, UA: NEGATIVE
Ketones, UA: NEGATIVE
Nitrite, UA: POSITIVE
Spec Grav, UA: 1.015
Urobilinogen, UA: 0.2
pH, UA: 7

## 2013-12-08 MED ORDER — CEPHALEXIN 500 MG PO TABS: 500.0000 mg | ORAL_TABLET | Freq: Three times a day (TID) | ORAL | Status: DC

## 2013-12-08 NOTE — Progress Notes (Signed)
Pre-visit discussion using our clinic review tool. No additional management support is needed unless otherwise documented below in the visit note.  dysuria:yes duration of symptoms: recently abdominal pain:lower abd pain fevers:no back pain:no vomiting:no Noted ucx pos treated 10/29/13.  She had recovered from that episode.    Meds, vitals, and allergies reviewed.   ROS: See HPI.  Otherwise negative.    GEN: nad, alert and oriented HEENT: mucous membranes moist NECK: supple CV: rrr.  PULM: ctab, no inc wob ABD: soft, +bs, suprapubic area minimally tender EXT: no edema SKIN: no acute rash BACK: no CVA pain

## 2013-12-08 NOTE — Patient Instructions (Signed)
Drink plenty of water and start the antibiotics today.  We'll contact you with your lab report.  Take care.   

## 2013-12-09 NOTE — Assessment & Plan Note (Signed)
Presumed, nontoxic, keflex, ucx, f/u prn.

## 2013-12-10 LAB — URINE CULTURE: Colony Count: >=100000

## 2013-12-18 ENCOUNTER — Ambulatory Visit: Payer: Medicare Other | Admitting: Nurse Practitioner

## 2013-12-23 ENCOUNTER — Telehealth: Payer: Self-pay | Admitting: Family Medicine

## 2013-12-23 ENCOUNTER — Ambulatory Visit (INDEPENDENT_AMBULATORY_CARE_PROVIDER_SITE_OTHER): Payer: Medicare Other | Admitting: Internal Medicine

## 2013-12-23 ENCOUNTER — Encounter: Payer: Self-pay | Admitting: Internal Medicine

## 2013-12-23 VITALS — BP 120/80 | HR 61 | Temp 98.3°F | Wt 185.0 lb

## 2013-12-23 DIAGNOSIS — R300 Dysuria: Secondary | ICD-10-CM

## 2013-12-23 LAB — POCT URINALYSIS DIPSTICK
Bilirubin, UA: NEGATIVE
Blood, UA: NEGATIVE
Glucose, UA: NEGATIVE
Ketones, UA: NEGATIVE
Nitrite, UA: POSITIVE
Protein, UA: NEGATIVE
Spec Grav, UA: 1.01
Urobilinogen, UA: NEGATIVE
pH, UA: 6.5

## 2013-12-23 MED ORDER — CIPROFLOXACIN HCL 250 MG PO TABS: 250.0000 mg | ORAL_TABLET | Freq: Two times a day (BID) | ORAL | Status: DC

## 2013-12-23 NOTE — Telephone Encounter (Signed)
Patient Information:  Caller Name: Earley AbideHilda  Phone: [phone]  Patient: Azucena FreedCoble, Letasha  Gender: Female  DOB: 1936/03/31  Age: 78 Years  PCP: Hannah Beatopland, [place] ([facility])  Office Follow Up:  Does the office need to follow up with this patient?: No  Instructions For The Office: N/A   Symptoms  Reason For Call & Symptoms: Burning/pain with urination, frequent urination, urgency with urination. Pressure over bladder area.  Reviewed Health History In EMR: Yes  Reviewed Medications In EMR: Yes  Reviewed Allergies In EMR: Yes  Reviewed Surgeries / Procedures: Yes  Date of Onset of Symptoms: 12/22/2013  Treatments Tried: Lynnette CaffeyUribil, extra water  Treatments Tried Worked: No  Guideline(s) Used:  Urination Pain - Female  Disposition Per Guideline:   See Today in Office  Reason For Disposition Reached:   > 2 UTIs in last year  Advice Given:  N/A  Patient Will Follow Care Advice:  YES  Appointment Scheduled:  12/23/2013 11:15:00 Appointment Scheduled Provider:  Tillman AbideLetvak , [facility](Family Practice)

## 2013-12-23 NOTE — Progress Notes (Signed)
Pre visit review using our clinic review tool, if applicable. No additional management support is needed unless otherwise documented below in the visit note. 

## 2013-12-23 NOTE — Progress Notes (Signed)
   Subjective:    Patient ID: [name] [name], female    DOB: 08-Sep-1936, 78 y.o.   MRN: [id number]  HPI Thinks she has a UTI again Has some abdominal discomfort Used OTC anaesthetic-- urabelle--and it helped  Stinging when voiding Did feel better after the cephalexin but then came back No history of kidney stone  No fever Seems to empty bladder okay Some low back pain---very mild Still sexually active-- none recently though (doesn't think it is related) No rash  Current Outpatient Prescriptions on File Prior to Visit  Medication Sig Dispense Refill  . Multiple Vitamin (MULTIVITAMIN) tablet Take 1 tablet by mouth daily. Multiple vit  With d      . pyridostigmine (MESTINON) 60 MG tablet Take 2 tablets (120 mg total) by mouth 3 (three) times daily.  180 tablet  6   No current facility-administered medications on file prior to visit.    Allergies  Allergen Reactions  . Alendronate Sodium     REACTION: upset stomach  . Sulfa Antibiotics     rash  . Tetanus Toxoids     rash  . Vesicare [Solifenacin]     dysuria    Past Medical History  Diagnosis Date  . Myasthenia gravis   . GERD (gastroesophageal reflux disease)   . Depression   . Nocardia infection     h/o brain abscess  . Anxiety   . Fibromyalgia   . Osteoporosis   . Memory loss   . Achilles tendonitis 10/28/2012    Past Surgical History  Procedure Laterality Date  . Appendectomy    . Cholecystectomy    . Abdominal hysterectomy    . Tonsillectomy and adenoidectomy    . Breast biopsy    . Bladder repair      Family History  Problem Relation Age of Onset  . Hypertension Brother   . Heart disease Brother   . Heart disease Paternal Uncle   . Hypertension Paternal Uncle   . Diabetes Neg Hx   . Colon cancer Neg Hx   . Breast cancer Mother   . Cancer Mother   . Cirrhosis Father     History   Social History  . Marital Status: Married    Spouse Name: Psychologist, prison and probation servicesJB-James Baxter     Number of Children: 3  .  Years of Education: COLLEGE   Occupational History  . retired Paediatric nursemanager of school cafeteria     2000  . RETIRED [employer]    Social History Main Topics  . Smoking status: Never Smoker   . Smokeless tobacco: Never Used  . Alcohol Use: No  . Drug Use: No  . Sexual Activity: Not on file   Other Topics Concern  . Not on file   Social History Narrative   Married 1964   3 kids, local   Retired from [employer].    Review of Systems No nausea or vomiting Appetite is fair    Objective:   Physical Exam  Abdominal: Soft. She exhibits no distension and no mass. There is no tenderness. There is no rebound and no guarding.  Musculoskeletal:  No CVA tenderness          Assessment & Plan:

## 2013-12-23 NOTE — Patient Instructions (Signed)
Please call if your urinary symptoms recur

## 2013-12-23 NOTE — Assessment & Plan Note (Signed)
Has some burning and abdominal discomfort Just finished cephalexin No rash or discharge  Will try cipro (sulfa allergy) Consider urology if recurs---could have bladder problem

## 2013-12-30 ENCOUNTER — Encounter: Payer: Self-pay | Admitting: Neurology

## 2013-12-30 ENCOUNTER — Encounter (INDEPENDENT_AMBULATORY_CARE_PROVIDER_SITE_OTHER): Payer: Self-pay

## 2013-12-30 ENCOUNTER — Ambulatory Visit (INDEPENDENT_AMBULATORY_CARE_PROVIDER_SITE_OTHER): Payer: Medicare Other | Admitting: Neurology

## 2013-12-30 VITALS — BP 154/89 | HR 68 | Ht 67.0 in | Wt 186.0 lb

## 2013-12-30 DIAGNOSIS — R413 Other amnesia: Secondary | ICD-10-CM

## 2013-12-30 DIAGNOSIS — G7000 Myasthenia gravis without (acute) exacerbation: Secondary | ICD-10-CM

## 2013-12-30 MED ORDER — PREDNISONE 5 MG PO TABS: ORAL_TABLET | ORAL | Status: DC

## 2013-12-30 NOTE — Progress Notes (Signed)
Reason for visit: Myasthenia gravis  Jefm BryantHilda A Yerkes is an 78 y.o. female  History of present illness:  Ms. Shela NevinCoble is a 78 year old right-handed white female with a history of ocular myasthenia gravis. The patient recently has had numerous urinary tract infections, and she is just now coming off of Cipro for treatment of a recent urinary tract infection. Over the last several months, the patient has had intermittent episodes of problems with double vision and ptosis affecting the right eye. The patient is having difficulty reading, watching TV, and driving a car. The patient denies any weakness with chewing, swallowing, or change in vocal quality, or weakness of the arms or legs. The patient indicates that if she closes one eye or the other, the double vision goes away. The patient returns to this office for an evaluation. The patient is on Mestinon taking 120 mg 3 times daily. The patient has several bowel movements a day, but no overt diarrhea. The patient denies sweats or nausea. The patient denies abdominal cramping. The patient returns for an evaluation.  Past Medical History  Diagnosis Date  . Myasthenia gravis   . GERD (gastroesophageal reflux disease)   . Depression   . Nocardia infection     h/o brain abscess  . Anxiety   . Fibromyalgia   . Osteoporosis   . Memory loss   . Achilles tendonitis 10/28/2012    Past Surgical History  Procedure Laterality Date  . Appendectomy    . Cholecystectomy    . Abdominal hysterectomy    . Tonsillectomy and adenoidectomy    . Breast biopsy    . Bladder repair      Family History  Problem Relation Age of Onset  . Hypertension Brother   . Heart disease Brother   . Heart disease Paternal Uncle   . Hypertension Paternal Uncle   . Diabetes Neg Hx   . Colon cancer Neg Hx   . Breast cancer Mother   . Cancer Mother   . Cirrhosis Father     Social history:  reports that she has never smoked. She has never used smokeless tobacco. She  reports that she does not drink alcohol or use illicit drugs.    Allergies  Allergen Reactions  . Alendronate Sodium     REACTION: upset stomach  . Sulfa Antibiotics     rash  . Tetanus Toxoids     rash  . Vesicare [Solifenacin]     dysuria    Medications:  Current Outpatient Prescriptions on File Prior to Visit  Medication Sig Dispense Refill  . pyridostigmine (MESTINON) 60 MG tablet Take 2 tablets (120 mg total) by mouth 3 (three) times daily.  180 tablet  6  . Multiple Vitamin (MULTIVITAMIN) tablet Take 1 tablet by mouth daily. Multiple vit  With d       No current facility-administered medications on file prior to visit.    ROS:  Out of a complete 14 system review of symptoms, the patient complains only of the following symptoms, and all other reviewed systems are negative.  Light sensitivity, double vision, droopy eyelid  Blood pressure 154/89, pulse 68, height 5\' 7"  (1.702 m), weight 186 lb (84.369 kg).  Physical Exam  General: The patient is alert and cooperative at the time of the examination.  Skin: 1-2+ edema of the ankles is noted bilaterally.   Neurologic Exam  Mental status: The patient is oriented x 3.  Cranial nerves: Facial symmetry is not present. Speech is  normal, no aphasia or dysarthria is noted. Extraocular movements are full. Visual fields are full. On primary gaze, the patient has ptosis involving the right eye. With superior gaze, the ptosis worsens after 30 seconds. The patient reports double vision immediately with looking up, with relative adduction of the eyes bilaterally. No weakness with jaw opening or closure is seen. The patient has good strength with head turning and shoulder shrug bilaterally.  Motor: The patient has good strength in all 4 extremities. With the arms outstretched for 1 minute, no fatigable weakness of the deltoid muscles is noted.  Sensory examination: Soft touch sensation is symmetric on the face, arms, and  legs.  Coordination: The patient has good finger-nose-finger and heel-to-shin bilaterally.  Gait and station: The patient has a normal gait. Tandem gait is slightly unsteady. Romberg is negative. No drift is seen.  Reflexes: Deep tendon reflexes are symmetric.   Assessment/Plan:  1. Myasthenia gravis with ocular features  2. Memory disturbance  The patient is having some increased problems with ptosis and double vision. This may have been exacerbated by the recent urinary tract infection. The patient however, in general is having more issues with her vision over the last 6 months. The patient will be placed on low-dose prednisone taking 5 mg daily for 2 weeks, and then going to 10 mg daily. The patient will followup in 4 months. The patient will continue her Mestinon at the current dose. The patient will contact me if she has problems in the near future. The patient will go back on calcium and vitamin D supplementation.  Marlan Palau MD 12/30/2013 9:49 AM  [facility] [address] [place], [place] [address]  Phone [phone] Fax [phone]

## 2013-12-30 NOTE — Patient Instructions (Signed)
Myasthenia Gravis Myasthenia gravis is a disease that causes muscle weakness throughout the body. The muscles affected are the ones we can control (voluntary muscles). An example of a voluntary muscle is your hand muscles. You can control the muscles to make the hand pick something up. An example of an involuntary muscle is the heart. The heart beats without any direction from you.  Myasthenia Gravis is thought to be an autoimmune disease. That means that normal defenses of the body begin to attack the body. In this case, the immune system begins to attack cells located at the junctions of the muscles and the nerves. Women are affected more often. Women are affected at a younger age than men. Babies born to affected women frequently develop symptoms at an early age. SYMPTOMS Initially in the disease, the facial muscles are affected first. After this, a person may develop droopy eyelids. They may have difficulty controlling facial muscles. They may have problems chewing. Swallowing and speaking may become impaired. The weakness gradually spreads to the arms and legs. It begins to affect breathing. Sometimes, the symptoms lessen or go away without any apparent cause. DIAGNOSIS  Diagnosis can be made with blood tests. Tests such as electromyography may be done to examine the electrical activity in the muscle. An improvement in symptoms after having an anti-cholinesterase drug helps confirm the diagnosis.  TREATMENT  Medicines are usually prescribed as the first treatment. These medicines help, but they do not cure the disease. A plasma cleansing procedure (plasmapheresis) can be used to treat a crisis. It can also be used to prepare a person for surgery. This procedure produces short-term improvement. Some cases are helped by removing the thymus gland. Steroids are used for short-term benefits. Document Released: 01/22/2001 Document Revised: 01/08/2012 Document Reviewed: 10/16/2005 [employer] Patient  Information 2014 [employer].  

## 2014-02-02 ENCOUNTER — Other Ambulatory Visit: Payer: Self-pay | Admitting: Family Medicine

## 2014-02-02 DIAGNOSIS — M810 Age-related osteoporosis without current pathological fracture: Secondary | ICD-10-CM

## 2014-02-02 DIAGNOSIS — Z13220 Encounter for screening for lipoid disorders: Secondary | ICD-10-CM

## 2014-02-12 ENCOUNTER — Other Ambulatory Visit (INDEPENDENT_AMBULATORY_CARE_PROVIDER_SITE_OTHER): Payer: Medicare Other

## 2014-02-12 DIAGNOSIS — Z136 Encounter for screening for cardiovascular disorders: Secondary | ICD-10-CM

## 2014-02-12 DIAGNOSIS — Z13220 Encounter for screening for lipoid disorders: Secondary | ICD-10-CM

## 2014-02-12 DIAGNOSIS — M810 Age-related osteoporosis without current pathological fracture: Secondary | ICD-10-CM

## 2014-02-12 LAB — LIPID PANEL
Cholesterol: 185 mg/dL (ref 0–200)
HDL: 70.4 mg/dL (ref >39.00)
LDL Cholesterol: 93 mg/dL (ref 0–99)
Total CHOL/HDL Ratio: 3
Triglycerides: 109 mg/dL (ref 0.0–149.0)
VLDL: 21.8 mg/dL (ref 0.0–40.0)

## 2014-02-12 LAB — COMPREHENSIVE METABOLIC PANEL
ALT: 22 U/L (ref 0–35)
AST: 20 U/L (ref 0–37)
Albumin: 3.5 g/dL (ref 3.5–5.2)
Alkaline Phosphatase: 69 U/L (ref 39–117)
BUN: 20 mg/dL (ref 6–23)
CO2: 29 mEq/L (ref 19–32)
Calcium: 8.9 mg/dL (ref 8.4–10.5)
Chloride: 105 mEq/L (ref 96–112)
Creatinine, Ser: 1.2 mg/dL (ref 0.4–1.2)
GFR: 47.3 mL/min — ABNORMAL LOW (ref >60.00)
Glucose, Bld: 77 mg/dL (ref 70–99)
Potassium: 3.9 mEq/L (ref 3.5–5.1)
Sodium: 142 mEq/L (ref 135–145)
Total Bilirubin: 0.9 mg/dL (ref 0.3–1.2)
Total Protein: 6.5 g/dL (ref 6.0–8.3)

## 2014-02-13 LAB — VITAMIN D 25 HYDROXY (VIT D DEFICIENCY, FRACTURES): Vit D, 25-Hydroxy: 28 ng/mL — ABNORMAL LOW (ref 30–89)

## 2014-02-19 ENCOUNTER — Other Ambulatory Visit: Payer: Self-pay

## 2014-02-19 ENCOUNTER — Ambulatory Visit (INDEPENDENT_AMBULATORY_CARE_PROVIDER_SITE_OTHER): Payer: Medicare Other | Admitting: Family Medicine

## 2014-02-19 ENCOUNTER — Encounter: Payer: Self-pay | Admitting: Family Medicine

## 2014-02-19 VITALS — BP 124/76 | HR 70 | Temp 97.5°F | Ht 67.0 in | Wt 184.1 lb

## 2014-02-19 DIAGNOSIS — Z0000 Encounter for general adult medical examination without abnormal findings: Secondary | ICD-10-CM

## 2014-02-19 DIAGNOSIS — Z1231 Encounter for screening mammogram for malignant neoplasm of breast: Secondary | ICD-10-CM

## 2014-02-19 DIAGNOSIS — Z780 Asymptomatic menopausal state: Secondary | ICD-10-CM

## 2014-02-19 NOTE — Patient Instructions (Signed)
Shirlee LimerickMarion will call about your referral for the bone density test.   Take care and call back as needed.  Glad to see you.

## 2014-02-19 NOTE — Progress Notes (Signed)
Pre visit review using our clinic review tool, if applicable. No additional management support is needed unless otherwise documented below in the visit note.  I have personally reviewed the Medicare Annual Wellness questionnaire and have noted 1. The patient's medical and social history 2. Their use of alcohol, tobacco or illicit drugs 3. Their current medications and supplements 4. The patient's functional ability including ADL's, fall risks, home safety risks and hearing or visual             impairment. 5. Diet and physical activities 6. Evidence for depression or mood disorders  The patients weight, height, BMI have been recorded in the chart and visual acuity is per eye clinic.  I have made referrals, counseling and provided education to the patient based review of the above and I have provided the pt with a written personalized care plan for preventive services.  See scanned forms.  Routine anticipatory guidance given to patient.  See health maintenance. Flu 2014 Shingles 2008 PNA 2010 Tetanus 2004 Colonoscopy 2011 Breast cancer screening 2014 Advance directive- husband designated if patient were incapacitated.  DXA done 2014 Cognitive function addressed- see scanned forms- and if abnormal then additional documentation follows.   MG per neuro.    PMH and SH reviewed  Meds, vitals, and allergies reviewed.   ROS: See HPI.  Otherwise negative.    GEN: nad, alert and oriented HEENT: mucous membranes moist NECK: supple w/o LA CV: rrr. PULM: ctab, no inc wob ABD: soft, +bs EXT: no edema SKIN: no acute rash

## 2014-02-20 NOTE — Assessment & Plan Note (Signed)
See scanned forms.  Routine anticipatory guidance given to patient.  See health maintenance. Flu 2014 Shingles 2008 PNA 2010 Tetanus 2004 Colonoscopy 2011 Breast cancer screening 2014 Advance directive- husband designated if patient were incapacitated.  DXA done 2014 Cognitive function addressed- see scanned forms- and if abnormal then additional documentation follows.

## 2014-03-01 ENCOUNTER — Other Ambulatory Visit: Payer: Self-pay

## 2014-03-01 MED ORDER — PYRIDOSTIGMINE BROMIDE 60 MG PO TABS: 120.0000 mg | ORAL_TABLET | Freq: Three times a day (TID) | ORAL | Status: DC

## 2014-03-01 NOTE — Telephone Encounter (Signed)
Pharmacy requests 90 day supply

## 2014-03-27 ENCOUNTER — Ambulatory Visit: Payer: Medicare Other | Admitting: Nurse Practitioner

## 2014-05-29 ENCOUNTER — Telehealth: Payer: Self-pay | Admitting: Family Medicine

## 2014-05-29 DIAGNOSIS — G7000 Myasthenia gravis without (acute) exacerbation: Secondary | ICD-10-CM

## 2014-05-29 NOTE — Telephone Encounter (Signed)
Done. Thanks.

## 2014-05-29 NOTE — Telephone Encounter (Signed)
Pt was seeing Dr. Sandria ManlyLove but since he has retired pt is not fond of Dr. Anne HahnWillis. Pt request referral to Dr. Nita Sickleonika Patel.

## 2014-06-19 ENCOUNTER — Ambulatory Visit: Payer: Medicare Other | Admitting: Neurology

## 2014-06-19 ENCOUNTER — Ambulatory Visit (INDEPENDENT_AMBULATORY_CARE_PROVIDER_SITE_OTHER): Payer: Medicare Other | Admitting: Family Medicine

## 2014-06-19 ENCOUNTER — Encounter: Payer: Self-pay | Admitting: Family Medicine

## 2014-06-19 VITALS — BP 114/64 | HR 66 | Temp 98.1°F | Wt 186.8 lb

## 2014-06-19 DIAGNOSIS — G7000 Myasthenia gravis without (acute) exacerbation: Secondary | ICD-10-CM

## 2014-06-19 DIAGNOSIS — N3000 Acute cystitis without hematuria: Secondary | ICD-10-CM

## 2014-06-19 MED ORDER — PREDNISONE 5 MG PO TABS: ORAL_TABLET | ORAL | Status: DC

## 2014-06-19 NOTE — Progress Notes (Signed)
Pre visit review using our clinic review tool, if applicable. No additional management support is needed unless otherwise documented below in the visit note.  She has been on pred 10mg  a day for MG since ~3/15.  She has neuro eval pending.  Her eyelid sx didn't get a lot worse in the last week.  See below.   Recently with some lower abd pain, not feeling well overall.  [facility] started on cephalexin in the meantime.  She is some better today.   No fevers.  Still with some fatigue.  No discharge.    Meds, vitals, and allergies reviewed.   ROS: See HPI.  Otherwise, noncontributory.  nad ncat Mmm Neck supple, no LA rrr ctab abd not ttp Mild weakness of the eyelids B

## 2014-06-19 NOTE — Patient Instructions (Signed)
Finish the cephalexin.   Keep taking the prednisone with food.  Keep the new neurology appointment.

## 2014-06-21 NOTE — Assessment & Plan Note (Signed)
Presumed, improved in the meantime.  Finish current abx and f/u prn.

## 2014-06-21 NOTE — Assessment & Plan Note (Signed)
D/w pt about pred use.  Would continue for now.  No sig sx now.  She has f/u with neuro pending.  Prev note from Dr. Stann Mainland d/w pt, about rationale for starting pred.

## 2014-06-22 ENCOUNTER — Ambulatory Visit
Admission: RE | Admit: 2014-06-22 | Discharge: 2014-06-22 | Disposition: A | Discharge status: Home / Self Care | Payer: Medicare Other | Source: Ambulatory Visit

## 2014-06-22 ENCOUNTER — Encounter: Payer: Self-pay | Admitting: *Deleted

## 2014-06-22 DIAGNOSIS — Z1231 Encounter for screening mammogram for malignant neoplasm of breast: Secondary | ICD-10-CM

## 2014-07-02 ENCOUNTER — Encounter: Payer: Self-pay | Admitting: Family Medicine

## 2014-07-02 ENCOUNTER — Ambulatory Visit (INDEPENDENT_AMBULATORY_CARE_PROVIDER_SITE_OTHER): Payer: Medicare Other | Admitting: Family Medicine

## 2014-07-02 VITALS — BP 108/68 | HR 80 | Temp 97.8°F | Wt 186.8 lb

## 2014-07-02 DIAGNOSIS — R300 Dysuria: Secondary | ICD-10-CM

## 2014-07-02 LAB — POCT URINALYSIS DIPSTICK
Bilirubin, UA: NEGATIVE
Blood, UA: NEGATIVE
Glucose, UA: NEGATIVE
Ketones, UA: NEGATIVE
Leukocytes, UA: NEGATIVE
Nitrite, UA: NEGATIVE
Protein, UA: NEGATIVE
Spec Grav, UA: 1.025
Urobilinogen, UA: 0.2
pH, UA: 6

## 2014-07-02 MED ORDER — PHENAZOPYRIDINE HCL 100 MG PO TABS: 100.0000 mg | ORAL_TABLET | Freq: Three times a day (TID) | ORAL | Status: DC | PRN

## 2014-07-02 NOTE — Progress Notes (Signed)
Pre visit review using our clinic review tool, if applicable. No additional management support is needed unless otherwise documented below in the visit note.  Had dysuria, was been on abx prev, was getting better, she thought her sx resolved.  Then sx restarted in the last week.  She has intermittent dysuria in the meantime now.  No fevers.  Minimal discharge, at baseline.  No genital skin irritation. No bleeding.  Some caffeine; she cut out tea.  Some urgency.  She hasn't been doing Kegel exercises frequently.  Minimal stress incontinence per patient.   Meds, vitals, and allergies reviewed.   ROS: See HPI.  Otherwise, noncontributory.  nad ncat Mmm rrr ctab abd soft, not ttp No cva pain Ext w/o edema

## 2014-07-02 NOTE — Patient Instructions (Signed)
We'll contact you with your lab report (urine culture). In the meantime, drink plenty of fluids, avoid caffeine, and take pyridium in the meantime (up to 3 times a day).  It will change your urine color.  Take care.  Glad to see you.

## 2014-07-02 NOTE — Assessment & Plan Note (Signed)
Unclear if still with UTI.  Hold on abx for now, use pyridium, check ucx. She agrees. Call back prn.

## 2014-07-04 LAB — URINE CULTURE: Colony Count: 6000

## 2014-07-14 ENCOUNTER — Ambulatory Visit (INDEPENDENT_AMBULATORY_CARE_PROVIDER_SITE_OTHER): Payer: Medicare Other | Admitting: Neurology

## 2014-07-14 ENCOUNTER — Encounter: Payer: Self-pay | Admitting: Neurology

## 2014-07-14 VITALS — BP 122/76 | HR 67 | Temp 97.8°F | Resp 18 | Wt 187.8 lb

## 2014-07-14 DIAGNOSIS — R413 Other amnesia: Secondary | ICD-10-CM

## 2014-07-14 DIAGNOSIS — G7000 Myasthenia gravis without (acute) exacerbation: Secondary | ICD-10-CM

## 2014-07-14 MED ORDER — PREDNISONE 10 MG PO TABS: 10.0000 mg | ORAL_TABLET | Freq: Every day | ORAL | Status: DC

## 2014-07-14 NOTE — Progress Notes (Signed)
[name] Neurology Division Clinic Note - Initial Visit   Date: 07/14/2014  [name] [name] MRN: [id number] DOB: [name]   Dear Dr. Para March:  Thank you for your kind referral of [name] [name] for consultation of memory loss and myasthenia gravis. Although her history is well known to you, please allow [name] to reiterate it for the purpose of our medical record. The patient was accompanied to the clinic by daughter who also provides collateral information.     History of Present Illness: [name] [name] is a 78 y.o. right-handed Caucasian female with history of pcular myasthenia gravis (diagnosed 2007, antibody positive), history left nocardia brain with right hemiparesis which has since resolved (1983), and memory impairment presenting to establish care of ocular myasthenia and memory problems.  She was seeing Dr. Sandria Manly since 2009 for ocular myasthenia which presented in 2007 with diplopia, left ptosis, and jaw fatigue. It seems that symptoms were being managed with mestinon 120/60/60 for quite some time.  Around early 2015, she started having frequent UTIs and was given ciprofloxacin and developed problems with double vision and right ptosis.  Her care was transitioned to Dr. Anne Hahn after Dr. Sandria Manly retired and at his last visit in March 2015, prednisone  was started in addition to mestinon  TID.  Since then, she reports being relatively stable.  She continues to have ptosis on occasion, which typically improves after taking mestinon.  She denies any shortness of breath, difficulty swallowing/talking, or limb weakness.  She has never been hospitalized due to MG.  She currently takes prednisone  and mestinon  (10am, 2pm, 6pm).  Family reports that she is having steady decline in her memory over the past few years.  She is still able to do her ADLs and IADLs including finances and driving, but does endorse mild difficulty.  She seldom drives and restricts herself to local  distances only.  Hobbies include playing piano and reading.  She had neuropsychological evaluation in 2014 which showed mild cognitive impairment.  Dementia questions Memory Are you repeating things excessively?  yes Are you forgetting important details of conversations/events?  yes Are you prone to misplacing items more now than in the past?  yes Can you tell me about some recent headlines? Don't watch the new  Executive Are you having trouble managing financial matters? No Are you taking your medications regularly w/o prompting? Can you organize and prepare a large holiday meal for multiple people?  yes Can you multitask effectively?  yes  Language Do you have any word finding difficulties?  yes Do you have trouble following instructions or a conversation?  somewhat Have you been avoiding reading or writing due to problems with recognition or remembering words?  No, loves reading Are you using generalities when speaking because of memory trouble?  no  Visuospatial Are you getting lost while driving?  no Are you getting turned around in your own home?  no Do you have trouble recognizing familiar faces/family members/close friends?  no Do you have trouble dressing, putting on cloths? no    Past Medical History  Diagnosis Date  . Myasthenia gravis   . GERD (gastroesophageal reflux disease)   . Depression   . Nocardia infection     h/o brain abscess  . Anxiety   . Fibromyalgia   . Osteoporosis   . Memory loss   . Achilles tendonitis 10/28/2012    Past Surgical History  Procedure Laterality Date  . Appendectomy    . Cholecystectomy    .  Abdominal hysterectomy    . Tonsillectomy and adenoidectomy    . Breast biopsy    . Bladder repair       Medications:  Current Outpatient Prescriptions on File Prior to Visit  Medication Sig Dispense Refill  . Calcium Carb-Cholecalciferol (CALCIUM 600+D3) 600-800 MG-UNIT TABS Take one and one-fourth tablets per day      .  phenazopyridine (PYRIDIUM) 100 MG tablet Take 1 tablet (100 mg total) by mouth 3 (three) times daily as needed for pain.  10 tablet  0  . pyridostigmine (MESTINON) 60 MG tablet Take 2 tablets (120 mg total) by mouth 3 (three) times daily.  540 tablet  1   No current facility-administered medications on file prior to visit.    Allergies:  Allergies  Allergen Reactions  . Alendronate Sodium     REACTION: upset stomach  . Sulfa Antibiotics     rash  . Tetanus Toxoids     rash  . Vesicare [Solifenacin]     dysuria    Family History: Family History  Problem Relation Age of Onset  . Hypertension Brother   . Heart disease Brother   . Heart disease Paternal Uncle   . Hypertension Paternal Uncle   . Diabetes Neg Hx   . Colon cancer Neg Hx   . Breast cancer Mother   . Cancer Mother   . Cirrhosis Father     Social History: History   Social History  . Marital Status: Married    Spouse Name: Psychologist, prison and probation services     Number of Children: 3  . Years of Education: COLLEGE   Occupational History  . retired Paediatric nurse     2000  . RETIRED [employer]    Social History Main Topics  . Smoking status: Never Smoker   . Smokeless tobacco: Never Used  . Alcohol Use: No  . Drug Use: No  . Sexual Activity: Yes    Partners: Male   Other Topics Concern  . Not on file   Social History Narrative   Married 1964   3 kids, local   Retired from [employer].     Review of Systems:  CONSTITUTIONAL: No fevers, chills, night sweats, or weight loss.   EYES: +visual changes or eye pain ENT: No hearing changes.  No history of nose bleeds.   RESPIRATORY: No cough, wheezing and shortness of breath.   CARDIOVASCULAR: Negative for chest pain, and palpitations.   GI: Negative for abdominal discomfort, blood in stools or black stools.  No recent change in bowel habits.   GU:  No history of incontinence.   MUSCLOSKELETAL: No history of joint pain or swelling.  No myalgias.    SKIN: Negative for lesions, rash, and itching.   HEMATOLOGY/ONCOLOGY: Negative for prolonged bleeding, bruising easily, and swollen nodes.  No history of cancer.   ENDOCRINE: Negative for cold or heat intolerance, polydipsia or goiter.   PSYCH:  No depression or anxiety symptoms.   NEURO: As Above.   Vital Signs:  BP 122/76  Pulse 67  Temp(Src) 97.8 F (36.6 C)  Resp 18  Wt 187 lb 12.8 oz (85.186 kg)  SpO2 98%   General Medical Exam:   General:  Well appearing, cheerful, talkative, comfortable.   Eyes/ENT: see cranial nerve examination.   Neck: No masses appreciated.  Full range of motion without tenderness.  No carotid bruits. Respiratory:  Clear to auscultation, good air entry bilaterally.   Cardiac:  Regular rate and rhythm, no  murmur.    Extremities:  No deformities, edema, or skin discoloration. Good capillary refill.   Skin:  Skin color, texture, turgor normal. No rashes or lesions.  Neurological Exam: MENTAL STATUS:  Montreal Cognitive Assessment  07/14/2014  Visuospatial/ Executive (0/5) 3  Naming (0/3) 3  Attention: Read list of digits (0/2) 2  Attention: Read list of letters (0/1) 1  Attention: Serial 7 subtraction starting at 100 (0/3) 3  Language: Repeat phrase (0/2) 2  Language : Fluency (0/1) 1  Abstraction (0/2) 1  Delayed Recall (0/5) 0  Orientation (0/6) 5  Total 21  Adjusted Score (based on education) 21   Speech is not dysarthric, she is talkative and somewhat difficult to redirect at times.  CRANIAL NERVES: II:  No visual field defects.  Unremarkable fundi.   III-IV-VI: Pupils equal round and reactive to light.  Normal conjugate, extra-ocular eye movements in all directions of gaze.  No nystagmus.  No ptosis at rest or with sustained upward gaze at 30 seconds.   V:  Normal facial sensation.    VII:  Normal facial symmetry and movements. Orbicularis oculi, oribularis oris, and buccinator 5/5. VIII:  Normal hearing and vestibular function.   IX-X:   Normal palatal movement.   XI:  Normal shoulder shrug and head rotation.   XII:  Normal tongue strength and range of motion, no deviation or fasciculation.  MOTOR:  No atrophy, fasciculations or abnormal movements.  No pronator drift.  Tone is normal.  No fatigability.  Right Upper Extremity:    Left Upper Extremity:    Deltoid  5/5   Deltoid  5/5   Biceps  5/5   Biceps  5/5   Triceps  5/5   Triceps  5/5   Wrist extensors  5/5   Wrist extensors  5/5   Wrist flexors  5/5   Wrist flexors  5/5   Finger extensors  5/5   Finger extensors  5/5   Finger flexors  5/5   Finger flexors  5/5   Dorsal interossei  5/5   Dorsal interossei  5/5   Abductor pollicis  5/5   Abductor pollicis  5/5   Tone (Ashworth scale)  0  Tone (Ashworth scale)  0   Right Lower Extremity:    Left Lower Extremity:    Hip flexors  5/5   Hip flexors  5/5   Hip extensors  5/5   Hip extensors  5/5   Knee flexors  5/5   Knee flexors  5/5   Knee extensors  5/5   Knee extensors  5/5   Dorsiflexors  5/5   Dorsiflexors  5/5   Plantarflexors  5/5   Plantarflexors  5/5   Toe extensors  5/5   Toe extensors  5/5   Toe flexors  5/5   Toe flexors  5/5   Tone (Ashworth scale)  0  Tone (Ashworth scale)  0   MSRs:  Right                                                                 Left brachioradialis 2+  brachioradialis 2+  biceps 2+  biceps 2+  triceps 2+  triceps 2+  patellar 2+  patellar 2+  ankle jerk 2+  ankle  jerk 2+  Hoffman no  Hoffman no  plantar response down  plantar response down   SENSORY:  Normal and symmetric perception of light touch, pinprick, vibration, and proprioception.  Romberg's sign absent.   COORDINATION/GAIT: Normal finger-to- nose-finger and heel-to-shin.  Intact rapid alternating movements bilaterally.  Able to rise from a chair without using arms.  Gait narrow based and stable. Unsteady with tandem and heel-walking.  Toe walking intact.  Data: Lab Results  Component Value Date   TSH 0.58  05/10/2010   Neuropsychiatric testing 02/18/2013:  neuropsychological testing that showed evidence of minimum cognitive impairment. The patient did have some difficulty remembering names for people. Overall, the cognitive impairment was not severe.  MRI brain 12/25/2012:  Scattered white matter changes, mild perisylvian atrophy.  IMPRESSION: 1.  Seropositive myasthenia gravis, diagnosed 2007  - Symptoms predominately ocular and have no generalized  - Exam without evidence of bulbar weakness  - Continue prednisone  daily  - Recommended trying to taper mestinon to keep her on the lowest dose able  2.  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  Take mestinon as follows:   8 AM   2pm    8PM      Week 1   (2 pills)   (2 pills)   (1 pill)      Week 2   (2 pills)   (1 pills)   (1 pill)      *If there is worsening symptoms, go back to takeing 2 pills three times daily 2.  Continue prednisone  daily 3.  Neuropsychological testing 4.  Check vitamin B12 and vitamin E 5.  Return to clinic 59-month   The duration of this appointment visit was 60 minutes of face-to-face time with the patient.  Greater than 50% of this time was spent in counseling, explanation of diagnosis, planning of further management, and coordination of care.   Thank you for allowing me to participate in patient's care.  If I can answer any additional questions, I would be pleased to do so.    Sincerely,    Saagar Tortorella K. Allena Katz, DO

## 2014-07-14 NOTE — Patient Instructions (Addendum)
1.  Take mestinon as follows:   8 AM   2pm    8PM      Week 1   (2 pills)  (2 pills)  (1 pill)      Week 2   (2 pills)  (1 pills)  (1 pill)      *If you develop worsening symptoms, go back to takeing 2 pills three times daily 2.  Continue prednisone  daily 3.  Neuropsychological testing 4.  Check blood work 5.  Return to clinic 58-month

## 2014-07-15 LAB — VITAMIN B12: Vitamin B-12: 333 pg/mL (ref 211–911)

## 2014-07-17 LAB — VITAMIN E
Gamma-Tocopherol (Vit E): 2.1 mg/L (ref <=4.3)
Vitamin E (Alpha Tocopherol): 10.9 mg/L (ref 5.7–19.9)

## 2014-08-21 ENCOUNTER — Telehealth: Payer: Self-pay

## 2014-08-21 NOTE — Telephone Encounter (Signed)
Cindy with [facility] said pt started this AM with burning and pain in lower abd; pt states "she feels funny down there and thinks may have UTI". No burning or pain upon urination and no frequency. No fever and no confusion. [facility] or [facility]. I asked Arline AspCindy what was pts pain level now before going to talk with Dr Para Marchuncan; cindy put me on hold to ask pt; cindy talked with pt's husband who was upset [facility] and he was [facility] pt to [facility].

## 2014-08-21 NOTE — Telephone Encounter (Signed)
Patient Information: Caller Name: Earley AbideHilda Phone: [phone] Patient: Jefm BryantCoble, Sadye A Gender: Female DOB: October 14, 1936 Age: 78 Years PCP: Crawford Givensuncan, Graham Clelia Croft([facility]) [facility](Family Practice) Office Follow Up: Does the office need to follow up with this patient?: No Instructions For The Office: N/A RN Note: Called office and spoke to nurse Deerica Waszak who was trying to work pt in because all appts are booked for today. Got disconnected from pt, called back and spoke to husband who is very angry because there aren't any appts available today and he is going to take her to an UC. Advised nurse Artelia LarocheRena that husband is taking her to UC and she stated that she would let Dr.Duncan know. Symptoms Reason For Call & Symptoms: Calling about burning and painful in pelvic area--> pt states it "feels funny down there", small amt yellow discharge. Thinks she may have a UTI because sxs are similar to what she has had in the past. Reviewed Health History In EMR: Yes Reviewed Medications In EMR: Yes Reviewed Allergies In EMR: Yes Reviewed Surgeries / Procedures: Yes Date of Onset of Symptoms: 08/21/2014 Guideline(s) Used: Urination Pain - Female Disposition Per Guideline: See Today in Office Reason For Disposition Reached: Age > 50 years Advice Given: N/A Patient Will Follow Care Advice: YES

## 2014-08-21 NOTE — Telephone Encounter (Signed)
Message left for patient to return my call.  

## 2014-08-21 NOTE — Telephone Encounter (Signed)
We could have tried to work her in.  It sounds as though she is already going to UC.  I'll defer to UC.  See if you can get an update on patient.  Thanks.

## 2014-08-21 NOTE — Telephone Encounter (Signed)
Noted, thanks!

## 2014-09-22 ENCOUNTER — Ambulatory Visit (INDEPENDENT_AMBULATORY_CARE_PROVIDER_SITE_OTHER): Payer: Medicare Other | Admitting: Neurology

## 2014-09-22 ENCOUNTER — Encounter: Payer: Self-pay | Admitting: Neurology

## 2014-09-22 VITALS — BP 130/80 | HR 72 | Ht 66.0 in | Wt 187.1 lb

## 2014-09-22 DIAGNOSIS — G7000 Myasthenia gravis without (acute) exacerbation: Secondary | ICD-10-CM

## 2014-09-22 DIAGNOSIS — G309 Alzheimer's disease, unspecified: Secondary | ICD-10-CM

## 2014-09-22 DIAGNOSIS — Z7189 Other specified counseling: Secondary | ICD-10-CM

## 2014-09-22 DIAGNOSIS — F0280 Dementia in other diseases classified elsewhere without behavioral disturbance: Secondary | ICD-10-CM

## 2014-09-22 MED ORDER — DONEPEZIL HCL 5 MG PO TABS: ORAL_TABLET | ORAL | Status: DC

## 2014-09-22 NOTE — Progress Notes (Signed)
Follow-up Visit   Date: 09/22/2014   [name] [name] MRN: [id number] DOB: [birth year]   Interim History: [name] BryantHilda A [name] is a 78 y.o. right-handed Caucasian female with history of ocular myasthenia gravis (diagnosed 2007, antibody positive), history left nocardia brain with right hemiparesis which has since resolved (1983), and memory impairment returning to the clinic for follow-up of memory loss and MG.  The patient was accompanied to the clinic by two daughters and daughter-in-law who also provides collateral information.    History of present illness: She was seeing Dr. Sandria ManlyLove since 2009 for ocular myasthenia which presented in 2007 with diplopia, left ptosis, and jaw fatigue. It seems that symptoms were being managed with mestinon 120/60/60 for quite some time. Around early 2015, she started having frequent UTIs and was given ciprofloxacin and developed problems with double vision and right ptosis. Her care was transitioned to Dr. Anne HahnWillis after Dr. Sandria ManlyLove retired and at his last visit in March 2015, prednisone 10mg  was started in addition to mestinon 120mg  TID. Since then, she reports being relatively stable. She continues to have ptosis on occasion, which typically improves after taking mestinon. She denies any shortness of breath, difficulty swallowing/talking, or limb weakness. She has never been hospitalized due to MG. She currently takes prednisone 10mg  and mestinon 120mg  (10am, 2pm, 6pm).  Family reports that she is having steady decline in her memory over the past few years. She is still able to do her ADLs and IADLs including finances and driving, but does endorse mild difficulty. She seldom drives and restricts herself to local distances only. Hobbies include playing piano and reading. She had neuropsychological evaluation in 2014 which showed mild cognitive impairment.  UPDATE 09/22/2014:  She underwent neuropsychiatric testing which was consistent with mild demential due to  Alzheimer's disease.  At her last visit, I recommended tapering her mestinon, but when she reduced it to 120-60-60, she "did not feel well" so she increased it back to 120mg  TID.  No new complaints.    Medications:  Current Outpatient Prescriptions on File Prior to Visit  Medication Sig Dispense Refill  . Calcium Carb-Cholecalciferol (CALCIUM 600+D3) 600-800 MG-UNIT TABS Take one and one-fourth tablets per day    . predniSONE (DELTASONE) 10 MG tablet Take 1 tablet (10 mg total) by mouth daily with breakfast. 90 tablet 3  . pyridostigmine (MESTINON) 60 MG tablet Take 2 tablets (120 mg total) by mouth 3 (three) times daily. 540 tablet 1   No current facility-administered medications on file prior to visit.    Allergies:  Allergies  Allergen Reactions  . Alendronate Sodium     REACTION: upset stomach  . Sulfa Antibiotics     rash  . Tetanus Toxoids     rash  . Vesicare [Solifenacin]     dysuria     Review of Systems:  CONSTITUTIONAL: No fevers, chills, night sweats, or weight loss.   EYES: No visual changes or eye pain ENT: No hearing changes.  No history of nose bleeds.   RESPIRATORY: No cough, wheezing and shortness of breath.   CARDIOVASCULAR: Negative for chest pain, and palpitations.   GI: Negative for abdominal discomfort, blood in stools or black stools.  No recent change in bowel habits.   GU:  No history of incontinence.   MUSCLOSKELETAL: No history of joint pain or swelling.  No myalgias.   SKIN: Negative for lesions, rash, and itching.   ENDOCRINE: Negative for cold or heat intolerance, polydipsia or goiter.   PSYCH:  No depression or anxiety symptoms.   NEURO: As Above.   Vital Signs:  BP 130/80 mmHg  Pulse 72  Ht 5\' 6"  (1.676 m)  Wt 187 lb 1 oz (84.851 kg)  BMI 30.21 kg/m2  SpO2 93%  Neurological Exam: MENTAL STATUS including orientation to time, place, person, recent and remote memory, attention span and concentration, language, and fund of knowledge is  fairly intact.  Speech is not dysarthric.  CRANIAL NERVES:  Pupils equal round and reactive to light.  Normal conjugate, extra-ocular eye movements in all directions of gaze.  No ptosis with sustained up gaze.  Facial muscles are 5/5.  Face is symmetric. Palate elevates symmetrically.  Tongue is midline.  MOTOR:  Motor strength is 5/5 in all extremities.  Normal tone.  MSRs:  Reflexes are 2+/4 throughout.  SENSORY:  Intact to vibration.  COORDINATION/GAIT: based and stable.   Data: Lab Results  Component Value Date   TSH 0.58 05/10/2010   Labs 07/14/2014:  Vitamin E 2.1, B12 333  Neuropsychiatric testing 02/18/2013: neuropsychological testing that showed evidence of minimum cognitive impairment. The patient did have some difficulty remembering names for people. Overall, the cognitive impairment was not severe.  [facility]:  Mild dementia due to Alzheimer's dementia.  MRI brain 12/25/2012: Scattered white matter changes, mild perisylvian atrophy.   IMPRESSION/PLAN: 1. Seropositive myasthenia gravis, diagnosed 2007 - clinically stable - Symptoms predominately ocular and have not generalized - Exam without evidence of bulbar weakness - Continue prednisone 10mg  daily - Adjust mestinon as follows: continue  120mg  in the morning, afternoon, and reduce evening dose to 60mg    2. Alzheimer's dementia, mild (dx 2015)  - Start aricept 5mg  x 1 month, then increase to 10mg  daily  - Recommend limiting driving   - Encouraged to set up POA  - Advanced Directives discussed and literature provided, she is full code  3.  Return to clinic in 563-months   The duration of this appointment visit was 40 minutes of face-to-face time with the patient.  Greater than 50% of this time was spent in counseling, explanation of diagnosis, planning of further management, and coordination of care.   Thank you for allowing  me to participate in patient's care.  If I can answer any additional questions, I would be pleased to do so.    Sincerely,    Lyndsie Wallman K. Allena KatzPatel, DO

## 2014-09-22 NOTE — Patient Instructions (Addendum)
1. Take mestinon as follows: 8 AM2pm 8PM   120mg  (2 pills)120mg (2 pills)60mg  (1 pill)  *If there is worsening symptoms, go back to takeing 2 pills three times daily 2. Continue prednisone 10mg  daily 3. Literature provided on Advanced Directive 4.  Encouraged to restrict driving 5.  Start taking Aricept 5mg  daily for one month, then increase to 10mg  (2 tab) daily 6. Return to clinic 6959-month

## 2014-10-13 ENCOUNTER — Ambulatory Visit: Payer: Medicare Other | Admitting: Neurology

## 2014-10-19 ENCOUNTER — Other Ambulatory Visit: Payer: Self-pay | Admitting: *Deleted

## 2014-10-19 ENCOUNTER — Other Ambulatory Visit: Payer: Self-pay | Admitting: Neurology

## 2014-10-19 MED ORDER — PYRIDOSTIGMINE BROMIDE 60 MG PO TABS: 120.0000 mg | ORAL_TABLET | Freq: Three times a day (TID) | ORAL | Status: DC

## 2014-10-21 ENCOUNTER — Encounter: Payer: Self-pay | Admitting: Family Medicine

## 2014-10-21 ENCOUNTER — Ambulatory Visit: Payer: Medicare Other | Admitting: Internal Medicine

## 2014-10-21 ENCOUNTER — Ambulatory Visit (INDEPENDENT_AMBULATORY_CARE_PROVIDER_SITE_OTHER): Payer: Medicare Other | Admitting: Family Medicine

## 2014-10-21 VITALS — BP 126/70 | HR 66 | Temp 97.8°F | Wt 190.0 lb

## 2014-10-21 DIAGNOSIS — M25562 Pain in left knee: Secondary | ICD-10-CM

## 2014-10-21 NOTE — Patient Instructions (Signed)
Your knee looks okay today.  Don't eat any extra salt (chips and ham) and elevate your feet during the day.  Take care. Glad to see you.

## 2014-10-21 NOTE — Progress Notes (Signed)
Pre visit review using our clinic review tool, if applicable. No additional management support is needed unless otherwise documented below in the visit note.  Larey SeatFell about 1 week ago.  Was kneeling, cleaning the floor in the bathroom, wasn't fully standing. Slipped and went the rest of the way down. No LOC.   It took some work to get up but did so by herself and she could bear weight.   Minimal bruising on the L knee, resolved now.  Has some medial pain with weight bearing.  No pain with rest now.  No pain sitting in exam room before the exam. She used a brace and iced it down some.   She had minimal BLE edema at the ankles prev.   Meds, vitals, and allergies reviewed.   ROS: See HPI.  Otherwise, noncontributory.  nad ncat Mmm rrr ctab abd soft BLE w/o edema L knee with no bruising or edema or erythema.  Joint lines not ttp and patella not ttp, ACL LCL MCL feel solid

## 2014-10-25 DIAGNOSIS — M25569 Pain in unspecified knee: Secondary | ICD-10-CM | POA: Insufficient documentation

## 2014-10-25 NOTE — Assessment & Plan Note (Signed)
Resolved now, as is the prev edema.  Advised not to eat any extra salt (chips and ham) and elevate her feet during the day prn.  F/u prn.  She agrees.

## 2014-11-23 ENCOUNTER — Telehealth: Payer: Self-pay | Admitting: Neurology

## 2014-11-23 MED ORDER — DONEPEZIL HCL 10 MG PO TABS: 10.0000 mg | ORAL_TABLET | Freq: Every day | ORAL | Status: DC

## 2014-11-23 NOTE — Telephone Encounter (Signed)
Noted.  We can try generic donepezil 10mg  daily, too.  Donika K. Allena KatzPatel, DO

## 2014-11-23 NOTE — Telephone Encounter (Signed)
Patient's daughter statestates  Insurance is stating they will not cover patient's Aricept . She has only gotten 1 prescription . I advised her to contact the pharmacy and see if maybe it is a prior auth that is needed. She will contact us back after calling the pharmacy .

## 2014-11-23 NOTE — Telephone Encounter (Signed)
She is taking the generic Donepezil  Pharmacy states it just needs prior Berkley Harveyauth  They will fax over the form for this

## 2014-11-23 NOTE — Telephone Encounter (Signed)
Daughter - Satira Mccallumammy Norman called regarding Aricept prescription. Insurance will not cover. Please call Tammy back at [phone] / Sherri S.

## 2014-11-23 NOTE — Addendum Note (Signed)
Addended by: Glendale ChardPATEL, Marylu Dudenhoeffer K on: 11/23/2014 04:50 PM   Modules accepted: Orders, Medications

## 2014-11-23 NOTE — Telephone Encounter (Signed)
Letter rec'd from insurance that it was denied because of quantity limit, but she was taking 5mg  BID only when increasing the medication. New Rx sent which should be sufficient.    Barack Nicodemus K. Allena KatzPatel, DO

## 2014-12-02 NOTE — Telephone Encounter (Signed)
Left message for patient's daughter to call me back ?

## 2014-12-02 NOTE — Telephone Encounter (Signed)
I spoke with patient's daughter and she is going to call her mother to see if Rx was filled and covered by insurance.

## 2014-12-25 ENCOUNTER — Ambulatory Visit: Payer: Self-pay | Admitting: Neurology

## 2015-01-05 ENCOUNTER — Ambulatory Visit (INDEPENDENT_AMBULATORY_CARE_PROVIDER_SITE_OTHER): Payer: Medicare Other | Admitting: Neurology

## 2015-01-05 ENCOUNTER — Encounter: Payer: Self-pay | Admitting: Neurology

## 2015-01-05 VITALS — BP 138/88 | HR 73 | Ht 66.0 in | Wt 190.4 lb

## 2015-01-05 DIAGNOSIS — G7000 Myasthenia gravis without (acute) exacerbation: Secondary | ICD-10-CM

## 2015-01-05 DIAGNOSIS — G309 Alzheimer's disease, unspecified: Secondary | ICD-10-CM

## 2015-01-05 DIAGNOSIS — F0280 Dementia in other diseases classified elsewhere without behavioral disturbance: Secondary | ICD-10-CM

## 2015-01-05 NOTE — Progress Notes (Signed)
Follow-up Visit   Date: 01/05/2015   [name] [name] MRN: [id number] DOB: 1935-11-02   Interim History: [name] BryantHilda A [name] is a 79 y.o. right-handed Caucasian female with history of ocular myasthenia gravis (diagnosed 2007, antibody positive), history left nocardia brain with right hemiparesis which has since resolved (1983), and memory impairment returning to the clinic for follow-up of memory loss and MG.  The patient was accompanied to the clinic by two daughters and daughter-in-law who also provides collateral information.    History of present illness: She was seeing Dr. Sandria ManlyLove since 2009 for ocular myasthenia which presented in 2007 with diplopia, left ptosis, and jaw fatigue. It seems that symptoms were being managed with mestinon 120/60/60 for quite some time. Around early 2015, she started having frequent UTIs and was given ciprofloxacin and developed problems with double vision and right ptosis. Her care was transitioned to Dr. Anne HahnWillis after Dr. Sandria ManlyLove retired and at his last visit in March 2015, prednisone 10mg  was started in addition to mestinon 120mg  TID. Since then, she reports being relatively stable. She continues to have ptosis on occasion, which typically improves after taking mestinon. She denies any shortness of breath, difficulty swallowing/talking, or limb weakness. She has never been hospitalized due to MG. She currently takes prednisone 10mg  and mestinon 120mg  (10am, 2pm, 6pm).  Family reports that she is having steady decline in her memory over the past few years. She is still able to do her ADLs and IADLs including finances and driving, but does endorse mild difficulty. She seldom drives and restricts herself to local distances only. Hobbies include playing piano and reading. She had neuropsychological evaluation in 2014 which showed mild cognitive impairment.  UPDATE 09/22/2014:  She underwent neuropsychiatric testing which was consistent with mild demential due to  Alzheimer's disease.  At her last visit, I recommended tapering her mestinon, but when she reduced it to 120-60-60, she "did not feel well" so she increased it back to 120mg  TID.  UPDATE 01/05/2015:  She is taking mestinon 120mg  TID and reports when she reduced the does she "did not feel well", but denies any weakness.  She is feeling well and has no new complaints.  Her daughters mentioned that her memory has no changed despite started aricept and they have noticed that she is for argumentative with their father.  No signs of inappropriate laughter or crying.    Medications:  Current Outpatient Prescriptions on File Prior to Visit  Medication Sig Dispense Refill  . Calcium Carb-Cholecalciferol (CALCIUM 600+D3) 600-800 MG-UNIT TABS Take one and one-fourth tablets per day    . Cyanocobalamin (VITAMIN B 12 PO) Take by mouth daily.    Marland Kitchen. donepezil (ARICEPT) 10 MG tablet Take 1 tablet (10 mg total) by mouth at bedtime. 30 tablet 3  . predniSONE (DELTASONE) 10 MG tablet Take 1 tablet (10 mg total) by mouth daily with breakfast. 90 tablet 3  . pyridostigmine (MESTINON) 60 MG tablet Take 2 tablets (120 mg total) by mouth 3 (three) times daily. 540 tablet 1   No current facility-administered medications on file prior to visit.    Allergies:  Allergies  Allergen Reactions  . Alendronate Sodium     REACTION: upset stomach  . Sulfa Antibiotics     rash  . Tetanus Toxoids     rash  . Vesicare [Solifenacin]     dysuria     Review of Systems:  CONSTITUTIONAL: No fevers, chills, night sweats, or weight loss.   EYES: No visual  changes or eye pain ENT: No hearing changes.  No history of nose bleeds.   RESPIRATORY: No cough, wheezing and shortness of breath.   CARDIOVASCULAR: Negative for chest pain, and palpitations.   GI: Negative for abdominal discomfort, blood in stools or black stools.  No recent change in bowel habits.   GU:  No history of incontinence.   MUSCLOSKELETAL: No history of joint  pain or swelling.  No myalgias.   SKIN: Negative for lesions, rash, and itching.   ENDOCRINE: Negative for cold or heat intolerance, polydipsia or goiter.   PSYCH:  No depression or anxiety symptoms.   NEURO: As Above.   Vital Signs:  BP 138/88 mmHg  Pulse 73  Ht  (1.676 m)  Wt 190 lb 7 oz (86.382 kg)  BMI 30.75 kg/m2  SpO2 95%  Neurological Exam: MENTAL STATUS including orientation to time, place, person, recent and remote memory, attention span and concentration, language, and fund of knowledge is fairly intact.  Speech is not dysarthric.  CRANIAL NERVES:  Pupils equal round and reactive to light.  Normal conjugate, extra-ocular eye movements in all directions of gaze.  No ptosis with sustained up gaze.  Facial muscles are 5/5.  Face is symmetric. Palate elevates symmetrically.  Tongue is midline.  MOTOR:  Motor strength is 5/5 in all extremities.  Normal tone.  MSRs:  Reflexes are 2+/4 throughout.  SENSORY:  Intact to vibration.  COORDINATION/GAIT: based and stable.   Data: Lab Results  Component Value Date   TSH 0.58 05/10/2010   Labs 07/14/2014:  Vitamin E 2.1, B12 333  Neuropsychiatric testing 02/18/2013: neuropsychological testing that showed evidence of minimum cognitive impairment. The patient did have some difficulty remembering names for people. Overall, the cognitive impairment was not severe.  Neuropsychiatric testing at [facility] 07/30/2014:  Mild dementia due to Alzheimer's dementia.  MRI brain 12/25/2012: Scattered white matter changes, mild perisylvian atrophy.   IMPRESSION/PLAN: 1. Seropositive myasthenia gravis, diagnosed 2007 - clinically stable - Symptoms predominately ocular and have not generalized - Exam without evidence of bulbar weakness - Continue prednisone  daily  - Reduce mestinon to  three times daily slowly (schedule provided), instructed patient if weakness occurs, to go back to taking the  higher dose  2. Alzheimer's dementia, mild (dx 2015)  - Continue Aricept  daily  3.  Return to clinic in 6 weeks, if she is doing well at her next visit, plan to taper prednisone   The duration of this appointment visit was 30 minutes of face-to-face time with the patient.  Greater than 50% of this time was spent in counseling, explanation of diagnosis, planning of further management, and coordination of care.   Thank you for allowing me to participate in patient's care.  If I can answer any additional questions, I would be pleased to do so.    Sincerely,    Donika K. Allena Katz, DO

## 2015-01-05 NOTE — Patient Instructions (Addendum)
1. Take mestinon as follows:   8 AM2pm 8PM  Week 1:  120mg  (2 pills)60 mg(1 pill) 120mg  (2 pill)  Week 2:         60 mg (1 pill)       60 mg (1 pill)        120 mg (2 pills)  Week 3:         60 mg  (1 pill)       60 mg (1 pill)                 60mg  (1 pill) and continue  2. Continue prednisone 10mg  daily 3. Continue Aricept 10mg  daily 6. Return to clinic 6 weeks

## 2015-02-17 ENCOUNTER — Encounter: Payer: Self-pay | Admitting: Neurology

## 2015-02-17 ENCOUNTER — Ambulatory Visit (INDEPENDENT_AMBULATORY_CARE_PROVIDER_SITE_OTHER): Payer: Medicare Other | Admitting: Neurology

## 2015-02-17 VITALS — BP 140/86 | HR 70 | Wt 189.2 lb

## 2015-02-17 DIAGNOSIS — G7000 Myasthenia gravis without (acute) exacerbation: Secondary | ICD-10-CM

## 2015-02-17 DIAGNOSIS — Z79899 Other long term (current) drug therapy: Secondary | ICD-10-CM | POA: Diagnosis not present

## 2015-02-17 DIAGNOSIS — G309 Alzheimer's disease, unspecified: Secondary | ICD-10-CM

## 2015-02-17 DIAGNOSIS — F0280 Dementia in other diseases classified elsewhere without behavioral disturbance: Secondary | ICD-10-CM

## 2015-02-17 MED ORDER — PREDNISONE 1 MG PO TABS: ORAL_TABLET | ORAL | Status: DC

## 2015-02-17 NOTE — Progress Notes (Signed)
Follow-up Visit   Date: 02/17/2015   [name] [name] MRN: [id number] DOB: Apr 29, 1936   Interim History: [name] [name] is a 79 y.o. right-handed Caucasian female with history of ocular myasthenia gravis (diagnosed 2007, antibody positive), history left nocardia brain with right hemiparesis which has since resolved (1983), and memory impairment returning to the clinic for follow-up of memory loss and MG.  The patient was accompanied to the clinic by two daughters who also provides collateral information.    History of present illness: She was seeing Dr. Sandria ManlyLove since 2009 for ocular myasthenia which presented in 2007 with diplopia, left ptosis, and jaw fatigue. It seems that symptoms were being managed with mestinon 120/60/60 for quite some time. Around early 2015, she started having frequent UTIs and was given ciprofloxacin and developed problems with double vision and right ptosis. Her care was transitioned to Dr. Anne HahnWillis after Dr. Sandria ManlyLove retired and at his last visit in March 2015, prednisone 10mg  was started in addition to mestinon 120mg  TID. Since then, she reports being relatively stable. She continues to have ptosis on occasion, which typically improves after taking mestinon. She denies any shortness of breath, difficulty swallowing/talking, or limb weakness. She has never been hospitalized due to MG. She currently takes prednisone 10mg  and mestinon 120mg  (10am, 2pm, 6pm).  Family reports that she is having steady decline in her memory over the past few years. She is still able to do her ADLs and IADLs including finances and driving, but does endorse mild difficulty. She seldom drives and restricts herself to local distances only. Hobbies include playing piano and reading. She had neuropsychological evaluation in 2014 which showed mild cognitive impairment.  UPDATE 09/22/2014:  She underwent neuropsychiatric testing which was consistent with mild demential due to Alzheimer's disease.   At her last visit, I recommended tapering her mestinon, but when she reduced it to 120-60-60, she "did not feel well" so she increased it back to 120mg  TID.  UPDATE 01/05/2015:  She is taking mestinon 120mg  TID and reports when she reduced the does she "did not feel well", but denies any weakness.  She is feeling well and has no new complaints.  Her daughters mentioned that her memory has no changed despite started aricept and they have noticed that she is for argumentative with their father.  No signs of inappropriate laughter or crying.   UPDATE 02/17/2015:  She was able to reduce mestinon to 60mg  three times daily and did not notice any worsening of symptoms.   She complains of loose stool after eating.  She has not bowel incontinence or pain, but describes urgency.  She has not seen her PCP for this.     Medications:  Current Outpatient Prescriptions on File Prior to Visit  Medication Sig Dispense Refill  . Calcium Carb-Cholecalciferol (CALCIUM 600+D3) 600-800 MG-UNIT TABS Take one and one-fourth tablets per day    . Cyanocobalamin (VITAMIN B 12 PO) Take by mouth daily.    Marland Kitchen. donepezil (ARICEPT) 10 MG tablet Take 1 tablet (10 mg total) by mouth at bedtime. 30 tablet 3  . predniSONE (DELTASONE) 10 MG tablet Take 1 tablet (10 mg total) by mouth daily with breakfast. 90 tablet 3  . pyridostigmine (MESTINON) 60 MG tablet Take 2 tablets (120 mg total) by mouth 3 (three) times daily. 540 tablet 1   No current facility-administered medications on file prior to visit.    Allergies:  Allergies  Allergen Reactions  . Alendronate Sodium  REACTION: upset stomach  . Sulfa Antibiotics     rash  . Tetanus Toxoids     rash  . Vesicare [Solifenacin]     dysuria     Review of Systems:  CONSTITUTIONAL: No fevers, chills, night sweats, or weight loss.   EYES: No visual changes or eye pain ENT: No hearing changes.  No history of nose bleeds.   RESPIRATORY: No cough, wheezing and shortness of  breath.   CARDIOVASCULAR: Negative for chest pain, and palpitations.   GI: Negative for abdominal discomfort, blood in stools or black stools.  No recent change in bowel habits.   GU:  No history of incontinence.   MUSCLOSKELETAL: No history of joint pain or swelling.  No myalgias.   SKIN: Negative for lesions, rash, and itching.   ENDOCRINE: Negative for cold or heat intolerance, polydipsia or goiter.   PSYCH:  No depression or anxiety symptoms.   NEURO: As Above.   Vital Signs:  BP 140/86 mmHg  Pulse 70  Wt 189 lb 3 oz (85.815 kg)  SpO2 95%  Neurological Exam: MENTAL STATUS including orientation to time, place, person, recent and remote memory, attention span and concentration, language, and fund of knowledge is fairly intact.  Speech is not dysarthric.  CRANIAL NERVES:  Pupils equal round and reactive to light.  Normal conjugate, extra-ocular eye movements in all directions of gaze.  No ptosis with sustained up gaze.  Facial muscles are 5/5.  Face is symmetric. Palate elevates symmetrically.  Tongue is midline.  MOTOR:  Motor strength is 5/5 in all extremities.  Normal tone.  MSRs:  Reflexes are 2+/4 throughout.  SENSORY:  Intact to vibration.   Data: Lab Results  Component Value Date   TSH 0.58 05/10/2010   Labs 07/14/2014:  Vitamin E 2.1, B12 333  Neuropsychiatric testing 02/18/2013: neuropsychological testing that showed evidence of minimum cognitive impairment. The patient did have some difficulty remembering names for people. Overall, the cognitive impairment was not severe.  Neuropsychiatric testing at [facility] 07/30/2014:  Mild dementia due to Alzheimer's dementia.  MRI brain 12/25/2012: Scattered white matter changes, mild perisylvian atrophy.   IMPRESSION/PLAN: 1. Seropositive myasthenia gravis, diagnosed 2007 - clinically stable - Symptoms predominately ocular and have not generalized - Exam without evidence of bulbar  weakness - Taper prednisone to  daily for one month, if doing well, reduce to  daily (  and  tablets given and discussed at length how to take)  - Continue mestinon to  three times daily   2. Alzheimer's dementia, mild (dx 2015)  - Continue Aricept  daily  3.  Return to clinic in 8 weeks   The duration of this appointment visit was 25 minutes of face-to-face time with the patient.  Greater than 50% of this time was spent in counseling, explanation of diagnosis, planning of further management, and coordination of care.   Thank you for allowing me to participate in patient's care.  If I can answer any additional questions, I would be pleased to do so.    Sincerely,    Pancho Rushing K. Allena Katz, DO

## 2015-02-17 NOTE — Patient Instructions (Addendum)
1.  Reduce prednisone to 9mg  daily for one month         If doing well, then reduce to prednisone 8mg  daily and continue  2.  Continue pyridostigmine to 60mg  three times daily  3.  Return to clinic in 162-months

## 2015-04-19 ENCOUNTER — Encounter: Payer: Self-pay | Admitting: Neurology

## 2015-04-19 ENCOUNTER — Telehealth: Payer: Self-pay

## 2015-04-19 ENCOUNTER — Ambulatory Visit (INDEPENDENT_AMBULATORY_CARE_PROVIDER_SITE_OTHER): Payer: Medicare Other | Admitting: Neurology

## 2015-04-19 VITALS — BP 140/80 | HR 67 | Ht 66.0 in | Wt 192.2 lb

## 2015-04-19 DIAGNOSIS — Z79899 Other long term (current) drug therapy: Secondary | ICD-10-CM | POA: Diagnosis not present

## 2015-04-19 DIAGNOSIS — L989 Disorder of the skin and subcutaneous tissue, unspecified: Secondary | ICD-10-CM

## 2015-04-19 DIAGNOSIS — G7000 Myasthenia gravis without (acute) exacerbation: Secondary | ICD-10-CM

## 2015-04-19 NOTE — Telephone Encounter (Signed)
Pt walked in with skin lesion on rt leg; pt thinks been there 10 days; no pain or soreness, pt just noticed and wants checked out. Pt scheduled appt to see Dr Para March on 04/20/15 on 12:15 pm. If condition changes or worsens prior to appt pt will go to UC if needed.

## 2015-04-19 NOTE — Patient Instructions (Addendum)
1.  Reduce prednisone to 7mg  daily for one month, then 6mg  for one month, then 5mg  daily.  2.  Continue mestinon 60mg  three daily 3.  Discontinue aricept for 1 month to see if this helps with diarrhea. 4.  Follow-up with your primary care doctor regarding your new skin lesion 5.  Return to clinic in 3 months

## 2015-04-19 NOTE — Telephone Encounter (Signed)
Noted. Thanks.

## 2015-04-19 NOTE — Progress Notes (Signed)
Follow-up Visit   Date: 04/19/2015   [name] [name] MRN: [id number] DOB: [birth year]   Interim History: [name] [name] is a 79 y.o. right-handed Caucasian female with history of ocular myasthenia gravis (diagnosed 2007, antibody positive), history left nocardia brain with right hemiparesis which has since resolved (1983), and memory impairment returning to the clinic for follow-up of memory loss and MG.  The patient was accompanied to the clinic by two daughters who also provides collateral information.    History of present illness: She was seeing Dr. Sandria Manly since 2009 for ocular myasthenia which presented in 2007 with diplopia, left ptosis, and jaw fatigue. It seems that symptoms were being managed with mestinon 120/60/60 for quite some time. Around early 2015, she started having frequent UTIs and was given ciprofloxacin and developed problems with double vision and right ptosis. Her care was transitioned to Dr. Anne Hahn after Dr. Sandria Manly retired and at his last visit in March 2015, prednisone 10mg  was started in addition to mestinon 120mg  TID. Since then, she reports being relatively stable. She continues to have ptosis on occasion, which typically improves after taking mestinon. She denies any shortness of breath, difficulty swallowing/talking, or limb weakness. She has never been hospitalized due to MG. She currently takes prednisone 10mg  and mestinon 120mg  (10am, 2pm, 6pm).  Family reports that she is having steady decline in her memory over the past few years. She is still able to do her ADLs and IADLs including finances and driving, but does endorse mild difficulty. She seldom drives and restricts herself to local distances only. Hobbies include playing piano and reading. She had neuropsychological evaluation in 2014 which showed mild cognitive impairment.  UPDATE 09/22/2014:  She underwent neuropsychiatric testing which was consistent with mild demential due to Alzheimer's disease.   At her last visit, I recommended tapering her mestinon, but when she reduced it to 120-60-60, she "did not feel well" so she increased it back to 120mg  TID.  UPDATE 01/05/2015:  She is taking mestinon 120mg  TID and reports when she reduced the does she "did not feel well", but denies any weakness.  She is feeling well and has no new complaints.  Her daughters mentioned that her memory has no changed despite started aricept and they have noticed that she is for argumentative with their father.  No signs of inappropriate laughter or crying.   UPDATE 02/17/2015:  She was able to reduce mestinon to 60mg  three times daily and did not notice any worsening of symptoms.   She complains of loose stool after eating.  She has not bowel incontinence or pain, but describes urgency.  She has not seen her PCP for this.   UPDATE 04/19/2015:  At her last visit, instruction were given to taper her prednisone by 1mg  each month, she is currently on 8mg  and doing well.  She had not noticed any problems with her vision, facial weakness, or droopy eyelid.  She continues to have diarrhea and daughter has noticed a few accidents in public, so was asking about potential side effects, specifically Aricept.    Medications:  Current Outpatient Prescriptions on File Prior to Visit  Medication Sig Dispense Refill  . Cyanocobalamin (VITAMIN B 12 PO) Take by mouth daily.    Marland Kitchen donepezil (ARICEPT) 10 MG tablet Take 1 tablet (10 mg total) by mouth at bedtime. 30 tablet 3  . predniSONE (DELTASONE) 1 MG tablet Take 4 tablets daily 150 tablet 3  . predniSONE (DELTASONE) 10 MG tablet Take 1  tablet (10 mg total) by mouth daily with breakfast. (Patient taking differently: Take 8 mg by mouth daily with breakfast. ) 90 tablet 3  . pyridostigmine (MESTINON) 60 MG tablet Take 2 tablets (120 mg total) by mouth 3 (three) times daily. (Patient taking differently: Take 60 mg by mouth 3 (three) times daily. ) 540 tablet 1   No current  facility-administered medications on file prior to visit.    Allergies:  Allergies  Allergen Reactions  . Alendronate Sodium     REACTION: upset stomach  . Sulfa Antibiotics     rash  . Tetanus Toxoids     rash  . Vesicare [Solifenacin]     dysuria     Review of Systems:  CONSTITUTIONAL: No fevers, chills, night sweats, or weight loss.   EYES: No visual changes or eye pain ENT: No hearing changes.  No history of nose bleeds.   RESPIRATORY: No cough, wheezing and shortness of breath.   CARDIOVASCULAR: Negative for chest pain, and palpitations.   GI: Negative for abdominal discomfort, blood in stools or black stools.  No recent change in bowel habits.   GU:  No history of incontinence.   MUSCLOSKELETAL: No history of joint pain or swelling.  No myalgias.   SKIN: Negative for lesions, rash, and itching.   ENDOCRINE: Negative for cold or heat intolerance, polydipsia or goiter.   PSYCH:  No depression or anxiety symptoms.   NEURO: As Above.   Vital Signs:  BP 140/80 mmHg  Pulse 67  Ht  (1.676 m)  Wt 192 lb 3 oz (87.176 kg)  BMI 31.03 kg/m2  SpO2 96%  Neurological Exam: MENTAL STATUS including orientation to time, place, person, recent and remote memory, attention span and concentration, language, and fund of knowledge is fairly intact.  Speech is not dysarthric.  CRANIAL NERVES:  Pupils equal round and reactive to light.  Normal conjugate, extra-ocular eye movements in all directions of gaze.  No ptosis with sustained up gaze.  Facial muscles are 5/5.  Face is symmetric. Palate elevates symmetrically.  Tongue is midline.  MOTOR:  Motor strength is 5/5 in all extremities.  Normal tone.  MSRs:  Reflexes are 2+/4 throughout.  GAIT:  Narrow based and stable.  Able to perform squat without difficulty.   Data: Lab Results  Component Value Date   TSH 0.58 05/10/2010   Labs 07/14/2014:  Vitamin E 2.1, B12 333  Neuropsychiatric testing 02/18/2013: neuropsychological  testing that showed evidence of minimum cognitive impairment. The patient did have some difficulty remembering names for people. Overall, the cognitive impairment was not severe.  Ne[facility]psychiatric testing at [facility] 07/30/2014:  Mild dementia due to Alzheimer's dementia.  MRI brain 12/25/2012: Scattered white matter changes, mild perisylvian atrophy.   IMPRESSION/PLAN: 1. Seropositive myasthenia gravis, diagnosed 2007 - stable - Manifested with predominately ocular symptoms and have not generalized - Exam without evidence of bulbar weakness - Taper prednisone to  daily for one month, if doing well, continue to taper by  each month.  I will see her back when she is on  daily  - Continue mestinon to  three times daily   2. Alzheimer's dementia, mild (dx 2015)  - Hold for one month to see if there is any improvement in diarrhea  3.  Skin lesion over the right posterior knee, follow-up with PCP  4.  Return to clinic in 3 months   The duration of this appointment visit was 25 minutes of face-to-face time with the  patient.  Greater than 50% of this time was spent in counseling, explanation of diagnosis, planning of further management, and coordination of care.   Thank you for allowing me to participate in patient's care.  If I can answer any additional questions, I would be pleased to do so.    Sincerely,    Emsley Custer K. Posey Pronto, DO

## 2015-04-20 ENCOUNTER — Encounter: Payer: Self-pay | Admitting: Family Medicine

## 2015-04-20 ENCOUNTER — Ambulatory Visit (INDEPENDENT_AMBULATORY_CARE_PROVIDER_SITE_OTHER): Payer: Medicare Other | Admitting: Family Medicine

## 2015-04-20 VITALS — BP 112/62 | HR 67 | Temp 97.6°F | Wt 185.2 lb

## 2015-04-20 DIAGNOSIS — L989 Disorder of the skin and subcutaneous tissue, unspecified: Secondary | ICD-10-CM | POA: Diagnosis not present

## 2015-04-20 NOTE — Progress Notes (Signed)
Pre visit review using our clinic review tool, if applicable. No additional management support is needed unless otherwise documented below in the visit note.  Lesion on R posterior knee.  Unclear duration, likely not longstanding.  Not painful.  Asking about options.   Meds, vitals, and allergies reviewed.   ROS: See HPI.  Otherwise, noncontributory.  nad Skin with 21mm warty lesion on the R posterior knee. This doesn't look at all typical for a BCC or SCC.

## 2015-04-20 NOTE — Patient Instructions (Signed)
Likely a wart.  Will likely form a blister.  Cover with a bandaid.   When the blister pops, can clean with soapy water.  Use a bandaid and neosporin if needed.  Should gradually heal over.  We can re-treat if needed.  Take care. Glad to see you.

## 2015-04-21 DIAGNOSIS — R21 Rash and other nonspecific skin eruption: Secondary | ICD-10-CM | POA: Insufficient documentation

## 2015-04-21 NOTE — Assessment & Plan Note (Signed)
Likely a benign warty lesion.  D/w pt about options.  She elects for liq N2 treatment, this is reasonable.  Frozen x3 in clinic, routine cautions given.  F/u prn.  We can retreat if needed.  No complications.  See AVS.

## 2015-05-18 ENCOUNTER — Encounter: Payer: Self-pay | Admitting: Family Medicine

## 2015-05-18 ENCOUNTER — Ambulatory Visit (INDEPENDENT_AMBULATORY_CARE_PROVIDER_SITE_OTHER): Payer: Medicare Other | Admitting: Family Medicine

## 2015-05-18 VITALS — BP 120/80 | HR 65 | Temp 97.8°F | Wt 188.5 lb

## 2015-05-18 DIAGNOSIS — L989 Disorder of the skin and subcutaneous tissue, unspecified: Secondary | ICD-10-CM

## 2015-05-18 MED ORDER — PREDNISONE 1 MG PO TABS: ORAL_TABLET | ORAL | Status: DC

## 2015-05-18 NOTE — Progress Notes (Signed)
Pre visit review using our clinic review tool, if applicable. No additional management support is needed unless otherwise documented below in the visit note.  Lesion on R leg prev treated with liq N2, likely a wart.  Told pt to f/u if needed retreatment. No new sx.  Lesion is still rough on surface, slightly smaller than prev.  Not ttp, not draining.  No other lesions.    Meds, vitals, and allergies reviewed.   ROS: See HPI.  Otherwise, noncontributory.  nad 4mm warty lesion on R leg inferior to the knee.  No ulceration.  D/w pt about options.

## 2015-05-18 NOTE — Patient Instructions (Signed)
Likely a wart. Will likely form a blister. Cover with a bandaid.  When the blister pops, clean with soapy water.  Use a bandaid and neosporin if needed.  Should gradually heal over.  We can still work on this again if it doesn't heal over fully.

## 2015-05-19 NOTE — Assessment & Plan Note (Signed)
Still likely a wart.  D/w pt about options.  Retreated x3 with liq N2 w/o complication after consent from patient. No complications. F/u if needed.  Routine cautions and instructions given to patient.

## 2015-06-17 ENCOUNTER — Telehealth: Payer: Self-pay | Admitting: Family Medicine

## 2015-06-17 NOTE — Telephone Encounter (Signed)
Pt walked in with nausea.  We do not have any additional appointments for the evening.  Offered to let her speak with [facility] Nurse.  Pt declined but did accept 8:15 am appointment with Dr. Para March for 06/18/15.  Instructed to call team [facility], go to urgent care, ER or 911 if symptoms worsen.

## 2015-06-18 ENCOUNTER — Ambulatory Visit: Payer: Medicare Other | Admitting: Family Medicine

## 2015-06-29 ENCOUNTER — Encounter: Payer: Self-pay | Admitting: Family Medicine

## 2015-06-29 LAB — AST
ALT: 17 U/L (ref 7–35)
AST: 18 U/L
Creatinine, Ser: 1.05
Glucose: 84
Hemoglobin - METHGB: 14.3

## 2015-07-19 ENCOUNTER — Other Ambulatory Visit: Payer: Self-pay | Admitting: Family Medicine

## 2015-07-19 DIAGNOSIS — Z8639 Personal history of other endocrine, nutritional and metabolic disease: Secondary | ICD-10-CM

## 2015-07-22 ENCOUNTER — Ambulatory Visit (INDEPENDENT_AMBULATORY_CARE_PROVIDER_SITE_OTHER): Payer: Medicare Other | Admitting: Neurology

## 2015-07-22 ENCOUNTER — Encounter: Payer: Self-pay | Admitting: Neurology

## 2015-07-22 VITALS — BP 118/70 | HR 72 | Wt 188.6 lb

## 2015-07-22 DIAGNOSIS — G7000 Myasthenia gravis without (acute) exacerbation: Secondary | ICD-10-CM | POA: Diagnosis not present

## 2015-07-22 DIAGNOSIS — Z79899 Other long term (current) drug therapy: Secondary | ICD-10-CM

## 2015-07-22 MED ORDER — PREDNISONE 5 MG PO TABS: 7.5000 mg | ORAL_TABLET | Freq: Every day | ORAL | Status: DC

## 2015-07-22 NOTE — Patient Instructions (Signed)
1.  Start prednisone 7.5mg  daily (1.5 tablets) 2.  Continue mestinon  three times daily  Return to clinic in 3 months

## 2015-07-22 NOTE — Progress Notes (Signed)
Follow-up Visit   Date: 07/22/2015   [name] [name] MRN: [id number] DOB: 07-16-1936   Interim History: [name] [name] is a 79 y.o. right-handed Caucasian female with history of ocular myasthenia gravis (diagnosed 2007, antibody positive), history left nocardia brain with right hemiparesis which has since resolved (1983), and memory impairment returning to the clinic for follow-up of memory loss and MG.  The patient was accompanied to the clinic by two daughters who also provides collateral information.    History of present illness: She was seeing Dr. Sandria Manly since 2009 for ocular myasthenia which presented in 2007 with diplopia, left ptosis, and jaw fatigue. It seems that symptoms were being managed with mestinon 120/60/60 for quite some time. Around early 2015, she started having frequent UTIs and was given ciprofloxacin and developed problems with double vision and right ptosis. Her care was transitioned to Dr. Anne Hahn after Dr. Sandria Manly retired and at his last visit in March 2015, prednisone  was started in addition to mestinon  TID. Since then, she reports being relatively stable. She continues to have ptosis on occasion, which typically improves after taking mestinon. She denies any shortness of breath, difficulty swallowing/talking, or limb weakness. She has never been hospitalized due to MG. She currently takes prednisone  and mestinon  (10am, 2pm, 6pm).  Family reports that she is having steady decline in her memory over the past few years. She is still able to do her ADLs and IADLs including finances and driving, but does endorse mild difficulty. She seldom drives and restricts herself to local distances only. Hobbies include playing piano and reading. She had neuropsychological evaluation in 2014 which showed mild cognitive impairment.  UPDATE 09/22/2014:  She underwent neuropsychiatric testing which was consistent with mild demential due to Alzheimer's disease.   At her last visit, I recommended tapering her mestinon, but when she reduced it to 120-60-60, she "did not feel well" so she increased it back to  TID.  UPDATE 01/05/2015:  She is taking mestinon  TID and reports when she reduced the does she "did not feel well", but denies any weakness.  She is feeling well and has no new complaints.  Her daughters mentioned that her memory has no changed despite started aricept and they have noticed that she is for argumentative with their father.  No signs of inappropriate laughter or crying.   UPDATE 02/17/2015:  She was able to reduce mestinon to  three times daily and did not notice any worsening of symptoms.   She complains of loose stool after eating.  She has not bowel incontinence or pain, but describes urgency.  She has not seen her PCP for this.   UPDATE 04/19/2015:  At her last visit, instruction were given to taper her prednisone by  each month, she is currently on  and doing well.  She had not noticed any problems with her vision, facial weakness, or droopy eyelid.  She continues to have diarrhea and daughter has noticed a few accidents in public, so was asking about potential side effects, specifically Aricept.   UPDATE 07/22/2015:  She went down to prednisone  in August and a few days later, she started noticed right droopy eyelid so then increased it back to prednisone  daily.  She has occasional double vision. No problems with swallowing or slurred speech.  Her daughters are complaining of foul breath at times and questioning whether it could be medication side effect.   Medications:  Current Outpatient Prescriptions on File  Prior to Visit  Medication Sig Dispense Refill  . Cyanocobalamin (VITAMIN B 12 PO) Take by mouth daily.    Marland Kitchen pyridostigmine (MESTINON) 60 MG tablet Take 60 mg by mouth 3 (three) times daily.     No current facility-administered medications on file prior to visit.    Allergies:  Allergies  Allergen  Reactions  . Alendronate Sodium     REACTION: upset stomach  . Sulfa Antibiotics     rash  . Tetanus Toxoids     rash  . Vesicare [Solifenacin]     dysuria     Review of Systems:  CONSTITUTIONAL: No fevers, chills, night sweats, or weight loss.   EYES: No visual changes or eye pain ENT: No hearing changes.  No history of nose bleeds.   RESPIRATORY: No cough, wheezing and shortness of breath.   CARDIOVASCULAR: Negative for chest pain, and palpitations.   GI: Negative for abdominal discomfort, blood in stools or black stools.  No recent change in bowel habits.   GU:  No history of incontinence.   MUSCLOSKELETAL: No history of joint pain or swelling.  No myalgias.   SKIN: Negative for lesions, rash, and itching.   ENDOCRINE: Negative for cold or heat intolerance, polydipsia or goiter.   PSYCH:  No depression or anxiety symptoms.   NEURO: As Above.   Vital Signs:  BP 118/70 mmHg  Pulse 72  Wt 188 lb 9 oz (85.531 kg)  SpO2 97%  Neurological Exam: MENTAL STATUS including orientation to time, place, person, recent and remote memory, attention span and concentration, language, and fund of knowledge is fairly intact.  Speech is not dysarthric.  CRANIAL NERVES:  Pupils equal round and reactive to light.  Normal conjugate, extra-ocular eye movements in all directions of gaze.  No ptosis with sustained up gaze.  Facial muscles are 5/5.  Face is symmetric. Palate elevates symmetrically.  Tongue is midline.  MOTOR:  Motor strength is 5/5 in all extremities.  Normal tone.  GAIT:  Narrow based and stable.    Data: Lab Results  Component Value Date   TSH 0.58 05/10/2010   Labs 07/14/2014:  Vitamin E 2.1, B12 333  Neuropsychiatric testing 02/18/2013: neuropsychological testing that showed evidence of minimum cognitive impairment. The patient did have some difficulty remembering names for people. Overall, the cognitive impairment was not severe.  Ne[facility]psychiatric testing at [facility]  07/30/2014:  Mild dementia due to Alzheimer's dementia.  MRI brain 12/25/2012: Scattered white matter changes, mild perisylvian atrophy.   IMPRESSION/PLAN: 1. Seropositive myasthenia gravis, diagnosed 2007 - stable - Manifested with predominately ocular symptoms and have not generalized - Exam stable, without evidence of bulbar weakness - Adjust prednisone 7.5mg  daily with  tablets  - Continue mestinon to  three times daily   2. Alzheimer's dementia, mild (dx 2015)  - Did not tolerate Aricept  - Clinically stable, continue to follow  3. Halitosis, doubt this is due to medication side effect as this is a new problem and she has been on mestinon and prednisone for some time  - Follow-up with PCP for evaluation, ?GERD.  Return to clinic in 3 months   The duration of this appointment visit was 25 minutes of face-to-face time with the patient.  Greater than 50% of this time was spent in counseling, explanation of diagnosis, planning of further management, and coordination of care.   Thank you for allowing me to participate in patient's care.  If I can answer any additional questions, I would be  pleased to do so.    Sincerely,    Donika K. Posey Pronto, DO

## 2015-07-27 ENCOUNTER — Other Ambulatory Visit (INDEPENDENT_AMBULATORY_CARE_PROVIDER_SITE_OTHER): Payer: Medicare Other

## 2015-07-27 DIAGNOSIS — E559 Vitamin D deficiency, unspecified: Secondary | ICD-10-CM | POA: Diagnosis not present

## 2015-07-27 DIAGNOSIS — Z8639 Personal history of other endocrine, nutritional and metabolic disease: Secondary | ICD-10-CM

## 2015-07-27 LAB — VITAMIN D 25 HYDROXY (VIT D DEFICIENCY, FRACTURES): VITD: 21.23 ng/mL — ABNORMAL LOW (ref 30.00–100.00)

## 2015-07-27 LAB — LIPID PANEL
Cholesterol: 177 mg/dL (ref 0–200)
HDL: 48.9 mg/dL (ref >39.00)
LDL Cholesterol: 93 mg/dL (ref 0–99)
NonHDL: 128.37
Total CHOL/HDL Ratio: 4
Triglycerides: 175 mg/dL — ABNORMAL HIGH (ref 0.0–149.0)
VLDL: 35 mg/dL (ref 0.0–40.0)

## 2015-08-02 ENCOUNTER — Other Ambulatory Visit: Payer: Self-pay

## 2015-08-02 ENCOUNTER — Encounter: Payer: Self-pay | Admitting: Family Medicine

## 2015-08-02 ENCOUNTER — Ambulatory Visit (INDEPENDENT_AMBULATORY_CARE_PROVIDER_SITE_OTHER): Payer: Medicare Other | Admitting: Family Medicine

## 2015-08-02 VITALS — BP 120/70 | HR 68 | Temp 98.4°F | Ht 66.0 in | Wt 187.2 lb

## 2015-08-02 DIAGNOSIS — Z23 Encounter for immunization: Secondary | ICD-10-CM | POA: Diagnosis not present

## 2015-08-02 DIAGNOSIS — Z0000 Encounter for general adult medical examination without abnormal findings: Secondary | ICD-10-CM

## 2015-08-02 DIAGNOSIS — G309 Alzheimer's disease, unspecified: Secondary | ICD-10-CM | POA: Diagnosis not present

## 2015-08-02 DIAGNOSIS — F0280 Dementia in other diseases classified elsewhere without behavioral disturbance: Secondary | ICD-10-CM

## 2015-08-02 DIAGNOSIS — Z1231 Encounter for screening mammogram for malignant neoplasm of breast: Secondary | ICD-10-CM

## 2015-08-02 DIAGNOSIS — E559 Vitamin D deficiency, unspecified: Secondary | ICD-10-CM

## 2015-08-02 DIAGNOSIS — Z7189 Other specified counseling: Secondary | ICD-10-CM

## 2015-08-02 MED ORDER — CHOLECALCIFEROL 25 MCG (1000 UT) PO CAPS: 1000.0000 [IU] | ORAL_CAPSULE | Freq: Every day | ORAL | Status: DC

## 2015-08-02 NOTE — Patient Instructions (Addendum)
Call about a mammogram.  You can call for a mammogram at the [facility] [address] [place] [address] [place]  Call about an eye exam.  I think Dr. Loreta Ave will ask to repeat your colonoscopy in the near future.  Start back on B12 1000 mcg a day if not already taking it.   Take care.  Glad to see you.

## 2015-08-02 NOTE — Progress Notes (Signed)
Pre visit review using our clinic review tool, if applicable. No additional management support is needed unless otherwise documented below in the visit note.  I have personally reviewed the Medicare Annual Wellness questionnaire and have noted 1. The patient's medical and social history 2. Their use of alcohol, tobacco or illicit drugs 3. Their current medications and supplements 4. The patient's functional ability including ADL's, fall risks, home safety risks and hearing or visual             impairment. 5. Diet and physical activities 6. Evidence for depression or mood disorders  The patients weight, height, BMI have been recorded in the chart and visual acuity is per eye clinic.  I have made referrals, counseling and provided education to the patient based review of the above and I have provided the pt with a written personalized care plan for preventive services.  Provider list updated- see scanned forms.  Routine anticipatory guidance given to patient.  See health maintenance.  Flu 2016 Shingles prev done PNA 2016 Tetanus 2004 Colonoscopy 2011 Breast cancer screening 2015 DXA deferred for now Advance directive- husband or either daughter designated if patient were incapacitated.   Cognitive function addressed- see scanned forms- and if abnormal then additional documentation follows.  Lipids okay for now, d/w pt.   Vit D def d/w pt, re: labs and replacement.  Med list updated.  See med list.    Memory loss.  Oriented except for day of the week.  0/3 on recall.  Has been followed by neuro prev. Failed donepezil tx.  D/w pt.  She realizes that she has some memory loss.  D/w pt about avoiding high risk situations, esp driving.  Per her report, she is willing to relinquish driving.  I asked patient for permission to talk with her husband.  She consented.  We talked separately.  He notes that she is worsening and that he is doing the driving.  Encouraged him to drive for patient as he is  able.  She has f/u with neuro pending.    PMH and SH reviewed  Meds, vitals, and allergies reviewed.   ROS: See HPI.  Otherwise negative.    GEN: nad, alert and oriented except as above.  HEENT: mucous membranes moist NECK: supple w/o LA CV: rrr. PULM: ctab, no inc wob ABD: soft, +bs EXT: no edema SKIN: no acute rash

## 2015-08-04 DIAGNOSIS — E559 Vitamin D deficiency, unspecified: Secondary | ICD-10-CM | POA: Insufficient documentation

## 2015-08-04 DIAGNOSIS — Z7189 Other specified counseling: Secondary | ICD-10-CM | POA: Insufficient documentation

## 2015-08-04 NOTE — Assessment & Plan Note (Signed)
D/w pt about lab and restart vit D replacement.  See med list, updated, d/w pt's husband.

## 2015-08-04 NOTE — Assessment & Plan Note (Signed)
Flu 2016  Shingles prev done  PNA 2016  Tetanus 2004  Colonoscopy 2011  Breast cancer screening 2015  DXA deferred for now  Advance directive- husband or either daughter designated if patient were incapacitated.  Cognitive function addressed- see scanned forms- and if abnormal then additional documentation follows.

## 2015-08-04 NOTE — Assessment & Plan Note (Signed)
Worsened.  Oriented except for day of the week.  0/3 on recall.  Has been followed by neuro prev. Failed donepezil tx.  D/w pt.  She realizes that she has some memory loss.  D/w pt about avoiding high risk situations, esp driving.  Per her report, she is willing to relinquish driving.  I asked patient for permission to talk with her husband.  She consented.  We talked separately.  He notes that she is worsening and that he is doing the driving.  Encouraged him to drive for patient as he is able.  She has f/u with neuro pending.  This was a completely separate issue from the AMV and it took sig time with review of prev med trials, discussion with patient, and discussion with husband all at the OV separately.  >25 minutes spent in face to face time with patient, >50% spent in counselling or coordination of care, not related to the AMV.

## 2015-08-18 ENCOUNTER — Ambulatory Visit
Admission: RE | Admit: 2015-08-18 | Discharge: 2015-08-18 | Disposition: A | Discharge status: Home / Self Care | Payer: Medicare Other | Source: Ambulatory Visit

## 2015-08-18 DIAGNOSIS — Z1231 Encounter for screening mammogram for malignant neoplasm of breast: Secondary | ICD-10-CM

## 2015-09-17 ENCOUNTER — Telehealth: Payer: Self-pay | Admitting: Neurology

## 2015-09-17 ENCOUNTER — Other Ambulatory Visit: Payer: Self-pay | Admitting: Neurology

## 2015-09-17 NOTE — Telephone Encounter (Signed)
Rx sent 

## 2015-09-17 NOTE — Telephone Encounter (Signed)
PT's daughter Synetta Failnita called in regards to a refill of Mestinon/Dawn CB# [id number]

## 2015-09-17 NOTE — Telephone Encounter (Signed)
Synetta Failnita notified that Rx has been sent in.

## 2015-09-27 ENCOUNTER — Telehealth: Payer: Self-pay | Admitting: Neurology

## 2015-09-27 NOTE — Telephone Encounter (Signed)
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  She was asking if you knew anything about this new drug or the new drug Provasil.  They are not FDA approved but they were wondering if it would be worth a try. Please advise.

## 2015-09-27 NOTE — Telephone Encounter (Signed)
These are OTC cognitive supplements, mostly containing vitamins, that I do not have experience with. Her vitamin B12 and E was within normal limits when we checked it.   Patient is welcome to try these medication, if she chooses, and keep us posted how it works.  Donika K. Allena KatzPatel, DO

## 2015-09-27 NOTE — Telephone Encounter (Signed)
Left message giving Synetta Failnita information per Dr. Allena KatzPatel.

## 2015-09-27 NOTE — Telephone Encounter (Signed)
Pt daughter Synetta Failnita called and states that she has some questions about some medication she saw and wanted to know if we knew anything about them please call [phone]

## 2015-11-05 ENCOUNTER — Encounter: Payer: Self-pay | Admitting: Neurology

## 2015-11-05 ENCOUNTER — Ambulatory Visit (INDEPENDENT_AMBULATORY_CARE_PROVIDER_SITE_OTHER): Payer: Medicare Other | Admitting: Neurology

## 2015-11-05 VITALS — BP 126/74 | HR 92 | Ht 67.0 in | Wt 192.0 lb

## 2015-11-05 DIAGNOSIS — G7000 Myasthenia gravis without (acute) exacerbation: Secondary | ICD-10-CM | POA: Diagnosis not present

## 2015-11-05 DIAGNOSIS — Z79899 Other long term (current) drug therapy: Secondary | ICD-10-CM | POA: Diagnosis not present

## 2015-11-05 DIAGNOSIS — G309 Alzheimer's disease, unspecified: Secondary | ICD-10-CM | POA: Diagnosis not present

## 2015-11-05 DIAGNOSIS — F0280 Dementia in other diseases classified elsewhere without behavioral disturbance: Secondary | ICD-10-CM | POA: Diagnosis not present

## 2015-11-05 MED ORDER — PYRIDOSTIGMINE BROMIDE 60 MG PO TABS: ORAL_TABLET | ORAL | Status: DC

## 2015-11-05 NOTE — Progress Notes (Signed)
Follow-up Visit   Date: 11/05/2015   [name] [name] MRN: [id number] DOB: 03/18/1936   Interim History: [name] [name] is a 80 y.o. right-handed Caucasian female with history of ocular myasthenia gravis (diagnosed 2007, antibody positive), history left nocardia brain with right hemiparesis which has since resolved (1983), and memory impairment returning to the clinic for follow-up of memory loss and MG.  The patient was accompanied to the clinic by two daughters who also provides collateral information.    History of present illness: She was seeing Dr. Sandria Manly since 2009 for ocular myasthenia which presented in 2007 with diplopia, left ptosis, and jaw fatigue. It seems that symptoms were being managed with mestinon 120/60/60 for quite some time. Around early 2015, she started having frequent UTIs and was given ciprofloxacin and developed problems with double vision and right ptosis. Her care was transitioned to Dr. Anne Hahn after Dr. Sandria Manly retired and at his last visit in March 2015, prednisone 10mg  was started in addition to mestinon 120mg  TID. Since then, she reports being relatively stable. She continues to have ptosis on occasion, which typically improves after taking mestinon. She denies any shortness of breath, difficulty swallowing/talking, or limb weakness. She has never been hospitalized due to MG. She currently takes prednisone 10mg  and mestinon 120mg  (10am, 2pm, 6pm).  Family reports that she is having steady decline in her memory over the past few years. She is still able to do her ADLs and IADLs including finances and driving, but does endorse mild difficulty. She seldom drives and restricts herself to local distances only. Hobbies include playing piano and reading. She had neuropsychological evaluation in 2014 which showed mild cognitive impairment.  UPDATE 09/22/2014:  She underwent neuropsychiatric testing which was consistent with mild demential due to Alzheimer's disease.   At her last visit, I recommended tapering her mestinon, but when she reduced it to 120-60-60, she "did not feel well" so she increased it back to 120mg  TID.  UPDATE 01/05/2015:  She is taking mestinon 120mg  TID and reports when she reduced the does she "did not feel well", but denies any weakness.  She is feeling well and has no new complaints.  Her daughters mentioned that her memory has no changed despite started aricept and they have noticed that she is for argumentative with their father.  No signs of inappropriate laughter or crying.   UPDATE 02/17/2015:  She was able to reduce mestinon to 60mg  three times daily and did not notice any worsening of symptoms.   She complains of loose stool after eating.  She has not bowel incontinence or pain, but describes urgency.  She has not seen her PCP for this.   UPDATE 04/19/2015:  At her last visit, instruction were given to taper her prednisone by 1mg  each month, she is currently on 8mg  and doing well.  She had not noticed any problems with her vision, facial weakness, or droopy eyelid.  She continues to have diarrhea and daughter has noticed a few accidents in public, so was asking about potential side effects, specifically Aricept.   UPDATE 07/22/2015:  She went down to prednisone 6mg  in August and a few days later, she started noticed right droopy eyelid so then increased it back to prednisone 7mg  daily.  She has occasional double vision. No problems with swallowing or slurred speech.    UPDATE 11/05/2015:  Within 3 weeks of increasing prednisone 7.5mg , her ptosis resolved and she has been well.  No new issues with double vision,  difficulty with swallowing/talking.  Sometimes, she forget to takes her afternoon mestinon, but has not experienced any breakthrough weakness. Mood and memory is stable.  Family have not noticed any worsening of her memory.  She has not been driving at the recommendation of her neuropsychological testing, but is quite upset that she  cannot.     Medications:  Current Outpatient Prescriptions on File Prior to Visit  Medication Sig Dispense Refill  . Cholecalciferol ([employer] VITAMIN D3) 1000 UNITS capsule Take 1 capsule (1,000 Units total) by mouth daily.    . Cyanocobalamin (VITAMIN B 12 PO) Take by mouth daily.    . predniSONE (DELTASONE) 5 MG tablet Take 1.5 tablets (7.5 mg total) by mouth daily with breakfast. 140 tablet 3  . pyridostigmine (MESTINON) 60 MG tablet Take 60 mg by mouth 3 (three) times daily.     No current facility-administered medications on file prior to visit.    Allergies:  Allergies  Allergen Reactions  . Alendronate Sodium     REACTION: upset stomach  . Donepezil Hcl     intolerant  . Sulfa Antibiotics     rash  . Tetanus Toxoids     rash  . Vesicare [Solifenacin]     dysuria     Review of Systems:  CONSTITUTIONAL: No fevers, chills, night sweats, or weight loss.   EYES: No visual changes or eye pain ENT: No hearing changes.  No history of nose bleeds.   RESPIRATORY: No cough, wheezing and shortness of breath.   CARDIOVASCULAR: Negative for chest pain, and palpitations.   GI: Negative for abdominal discomfort, blood in stools or black stools.  No recent change in bowel habits.   GU:  No history of incontinence.   MUSCLOSKELETAL: No history of joint pain or swelling.  No myalgias.   SKIN: Negative for lesions, rash, and itching.   ENDOCRINE: Negative for cold or heat intolerance, polydipsia or goiter.   PSYCH:  No depression or anxiety symptoms.   NEURO: As Above.   Vital Signs:  BP 126/74 mmHg  Pulse 92  Ht 5\' 7"  (1.702 m)  Wt 192 lb (87.091 kg)  BMI 30.06 kg/m2  Neurological Exam: MENTAL STATUS including orientation to time, place, person, recent and remote memory, attention span and concentration, language, and fund of knowledge is fairly intact.  Speech is not dysarthric.  CRANIAL NERVES:  Pupils equal round and reactive to light.  Normal conjugate, extra-ocular eye  movements in all directions of gaze.  No ptosis with sustained up gaze.  Facial muscles are 5/5.  Face is symmetric. Palate elevates symmetrically.  Tongue is midline.  MOTOR:  Motor strength is 5/5 in all extremities.  Normal tone.  REFLEXES:  2+/4 throughout  SENSORY:  Vibration intact throughout  GAIT:  Narrow based and stable.    Data: Lab Results  Component Value Date   TSH 0.58 05/10/2010   Labs 07/14/2014:  Vitamin E 2.1, B12 333  Neuropsychiatric testing 02/18/2013: neuropsychological testing that showed evidence of minimum cognitive impairment. The patient did have some difficulty remembering names for people. Overall, the cognitive impairment was not severe.  [facility] 07/30/2014:  Mild dementia due to Alzheimer's dementia.  MRI brain 12/25/2012: Scattered white matter changes, mild perisylvian atrophy.   IMPRESSION/PLAN: 1. Seropositive myasthenia gravis, diagnosed 2007 - stable - Manifested with predominately ocular symptoms and have not generalized - Exam stable, without evidence of bulbar weakness - Continue prednisone 7.5mg  daily   - Reduce mestinon to 60mg   twice daily, okay to take extra dose as needed   2. Alzheimer's dementia, mild (dx 2015)  - Did not tolerate Aricept  - Clinically stable, continue to follow  - Do not recommend driving.  Patient has not been driving at the request of family, but is quite upset because she cannot. Recommended formal driving evaluation if she chooses and resources were provided.  Return to clinic in 6 months   The duration of this appointment visit was 25 minutes of face-to-face time with the patient.  Greater than 50% of this time was spent in counseling, explanation of diagnosis, planning of further management, and coordination of care.   Thank you for allowing me to participate in patient's care.  If I can answer any additional questions, I would be pleased  to do so.    Sincerely,    Vana Arif K. Allena Katz, DO

## 2015-11-05 NOTE — Patient Instructions (Addendum)
Reduce pyridostigmine to 60mg  at 8am and 5pm.  You may take an extra dose if needed Continue prednisone 7.5mg  daily If you choose to undergo driving evaluation, please refer to [email or web address]   Return to clinic in 486-months

## 2016-05-04 ENCOUNTER — Ambulatory Visit (INDEPENDENT_AMBULATORY_CARE_PROVIDER_SITE_OTHER): Payer: Medicare Other | Admitting: Neurology

## 2016-05-04 ENCOUNTER — Encounter: Payer: Self-pay | Admitting: Neurology

## 2016-05-04 VITALS — BP 120/78 | HR 73 | Wt 190.2 lb

## 2016-05-04 DIAGNOSIS — G7000 Myasthenia gravis without (acute) exacerbation: Secondary | ICD-10-CM | POA: Diagnosis not present

## 2016-05-04 DIAGNOSIS — F0280 Dementia in other diseases classified elsewhere without behavioral disturbance: Secondary | ICD-10-CM

## 2016-05-04 DIAGNOSIS — G309 Alzheimer's disease, unspecified: Secondary | ICD-10-CM

## 2016-05-04 NOTE — Patient Instructions (Addendum)
Continue prednisone 7.5mg  daily Continue mestinon 60mg  twice daily You can try pepcid or Nexium to see if this helps with your chest discomfort  Return to clinic 1 year

## 2016-05-04 NOTE — Progress Notes (Signed)
Follow-up Visit   Date: 05/04/2016   [name] [name] MRN: [id number] DOB: 04-May-1936   [name] [name] is a 80 y.o. right-handed Caucasian female with history of ocular myasthenia gravis (diagnosed 2007, antibody positive), history left nocardia brain with right hemiparesis which has since resolved (1983), and memory impairment returning to the clinic for follow-up of memory loss and MG.  The patient was accompanied to the clinic by two daughters who also provides collateral information.    History of present illness: She was seeing Dr. Sandria Manly since 2009 for ocular myasthenia which presented in 2007 with diplopia, left ptosis, and jaw fatigue. It seems that symptoms were being managed with mestinon 120/60/60 for quite some time. Around early 2015, she started having frequent UTIs and was given ciprofloxacin and developed problems with double vision and right ptosis. Her care was transitioned to Dr. Anne Hahn after Dr. Sandria Manly retired and at his last visit in March 2015, prednisone  was started in addition to mestinon  TID. Since then, she reports being relatively stable. She continues to have ptosis on occasion, which typically improves after taking mestinon. She denies any shortness of breath, difficulty swallowing/talking, or limb weakness. She has never been hospitalized due to MG. She currently takes prednisone  and mestinon  (10am, 2pm, 6pm).  Family reports that she is having steady decline in her memory over the past few years. She is still able to do her ADLs and IADLs including finances and driving, but does endorse mild difficulty. She seldom drives and restricts herself to local distances only. Hobbies include playing piano and reading. She had neuropsychological evaluation in 2014 which showed mild cognitive impairment.  UPDATE 09/22/2014:  She underwent neuropsychiatric testing which was consistent with mild demential due to Alzheimer's disease.   At her last visit, I recommended tapering her mestinon, but when she reduced it to 120-60-60, she "did not feel well" so she increased it back to  TID.  UPDATE 01/05/2015:  She is taking mestinon  TID and reports when she reduced the does she "did not feel well", but denies any weakness.  She is feeling well and has no new complaints.  Her daughters mentioned that her memory has no changed despite started aricept and they have noticed that she is for argumentative with their father.  No signs of inappropriate laughter or crying.   UPDATE 02/17/2015:  She was able to reduce mestinon to  three times daily and did not notice any worsening of symptoms.   She complains of loose stool after eating.  She has not bowel incontinence or pain, but describes urgency.  She has not seen her PCP for this.   UPDATE 04/19/2015:  At her last visit, instruction were given to taper her prednisone by  each month, she is currently on  and doing well.  She had not noticed any problems with her vision, facial weakness, or droopy eyelid.  She continues to have diarrhea and daughter has noticed a few accidents in public, so was asking about potential side effects, specifically Aricept.   UPDATE 07/22/2015:  She went down to prednisone  in August and a few days later, she started noticed right droopy eyelid so then increased it back to prednisone  daily.  She has occasional double vision. No problems with swallowing or slurred speech.    UPDATE 11/05/2015:  Within 3 weeks of increasing prednisone 7.5mg , her ptosis resolved and she has been well.  No new issues with double vision,  difficulty with swallowing/talking.  Sometimes, she forget to takes her afternoon mestinon, but has not experienced any breakthrough weakness. Mood and memory is stable.  Family have not noticed any worsening of her memory.  She has not been driving at the recommendation of her neuropsychological testing, but is quite upset that she  cannot.    UPDATE 05/04/2016:  She denies any problems with myasthenia.  She is compliant with her medications and continues to take prednisone 7.5mg  and mestinon 60mg  twice daily.  She does not need to take an extra dose of mestinon and has been stable.  She has noticed intermittent chest burning which is worse in the morning when she wakes up.  Her daughter not noticed new changes with her memory.  She is not driving anymore.  She co-manages finances and medications with her husband.  Medications:  Prednisone 7.5mg  Mestinon 60mg  twice daily Vitamin B12 1000mcg daily   Allergies:  Allergies  Allergen Reactions  . Alendronate Sodium     REACTION: upset stomach  . Donepezil Hcl     intolerant  . Sulfa Antibiotics     rash  . Tetanus Toxoids     rash  . Vesicare [Solifenacin]     dysuria     Review of Systems:  CONSTITUTIONAL: No fevers, chills, night sweats, or weight loss.   EYES: No visual changes or eye pain ENT: No hearing changes.  No history of nose bleeds.   RESPIRATORY: No cough, wheezing and shortness of breath.   CARDIOVASCULAR: Negative for chest pain, and palpitations.   GI: Negative for abdominal discomfort, blood in stools or black stools.  No recent change in bowel habits.   GU:  No history of incontinence.   MUSCLOSKELETAL: No history of joint pain or swelling.  No myalgias.   SKIN: Negative for lesions, rash, and itching.   ENDOCRINE: Negative for cold or heat intolerance, polydipsia or goiter.   PSYCH:  No depression or anxiety symptoms.   NEURO: As Above.   Vital Signs:  BP 120/78 mmHg  Pulse 73  Wt 190 lb 4 oz (86.297 kg)  SpO2 94%  Neurological Exam: MENTAL STATUS including orientation to time, place, person, recent and remote memory, attention span and concentration, language, and fund of knowledge is fairly intact.  Speech is not dysarthric.  Montreal Cognitive Assessment  05/04/2016 07/14/2014  Visuospatial/ Executive (0/5) 5 3  Naming (0/3) 3 3    Attention: Read list of digits (0/2) 2 2  Attention: Read list of letters (0/1) 1 1  Attention: Serial 7 subtraction starting at 100 (0/3) 3 3  Language: Repeat phrase (0/2) 2 2  Language : Fluency (0/1) 0 1  Abstraction (0/2) 1 1  Delayed Recall (0/5) 0 0  Orientation (0/6) 5 5  Total 22 21  Adjusted Score (based on education) 22 21    CRANIAL NERVES:  Pupils equal round and reactive to light.  Normal conjugate, extra-ocular eye movements in all directions of gaze.  No ptosis with sustained up gaze.  Facial muscles are 5/5.  Face is symmetric. Palate elevates symmetrically.  Tongue is midline.  MOTOR:  Motor strength is 5/5 in all extremities.  Normal tone.  REFLEXES:  2+/4 throughout  SENSORY:  Vibration intact throughout  GAIT:  Narrow based and stable.    Data: Lab Results  Component Value Date   TSH 0.58 05/10/2010   Labs 07/14/2014:  Vitamin E 2.1, B12 333  Neuropsychiatric testing 02/18/2013: neuropsychological testing that showed evidence of  minimum cognitive impairment. The patient did have some difficulty remembering names for people. Overall, the cognitive impairment was not severe.  [facility]:  Mild dementia due to Alzheimer's dementia.  MRI brain 12/25/2012: Scattered white matter changes, mild perisylvian atrophy.   IMPRESSION/PLAN: 1. Seropositive myasthenia gravis, diagnosed 2007 - stable - Manifested with predominately ocular symptoms and have not generalized - Exam stable, without evidence of bulbar weakness - Continue prednisone 7.5mg  daily   - Continue mestinon to 60mg  twice daily, okay to take extra dose as needed   2. Alzheimer's dementia, mild (dx 2015) and stable  - Did not tolerate Aricept  - Clinically stable, continue to follow  - Do not recommend driving.    3.  Suspect she had GERD causing her chest discomfort.    - recommend follow-up with PCP  Return to  clinic in 1 year  The duration of this appointment visit was 25 minutes of face-to-face time with the patient.  Greater than 50% of this time was spent in counseling, explanation of diagnosis, planning of further management, and coordination of care.   Thank you for allowing me to participate in patient's care.  If I can answer any additional questions, I would be pleased to do so.    Sincerely,    Tres Grzywacz K. Allena KatzPatel, DO

## 2016-06-13 ENCOUNTER — Telehealth: Payer: Self-pay | Admitting: Neurology

## 2016-06-13 NOTE — Telephone Encounter (Signed)
PT's daughter Babette Relicammy called and has a question regarding her medication and does she need her calcium tablets still/Dawn CB#[phone]

## 2016-06-14 NOTE — Telephone Encounter (Signed)
Attempted to call Tammy back.  No answer and no voicemail.

## 2016-06-15 NOTE — Telephone Encounter (Signed)
We can check vitamin B12 level and if this is normal, okay to d/c this.  Recommend that she continue vitamin D and calcium because she is on prednisone and steroids can contribute to osteoporosis.

## 2016-06-15 NOTE — Telephone Encounter (Signed)
I spoke with Tammy and she was wondering if her mom should still be taking vitamin d, b and calcium.

## 2016-06-15 NOTE — Telephone Encounter (Signed)
Left message for Tammy to call me back

## 2016-06-16 NOTE — Telephone Encounter (Signed)
Left message giving [name] [name] information and instructions.  Requested for her to call me back if she would like to bring her mom in for B12 testing.

## 2016-06-17 ENCOUNTER — Encounter: Payer: Self-pay | Admitting: Neurology

## 2016-07-21 ENCOUNTER — Telehealth: Payer: Self-pay | Admitting: Neurology

## 2016-07-21 NOTE — Telephone Encounter (Signed)
PT called regarding a prescription that was faxed/Dawn CB# [phone]

## 2016-07-23 ENCOUNTER — Other Ambulatory Visit: Payer: Self-pay | Admitting: Family Medicine

## 2016-07-23 ENCOUNTER — Encounter: Payer: Self-pay | Admitting: Neurology

## 2016-07-23 DIAGNOSIS — R413 Other amnesia: Secondary | ICD-10-CM

## 2016-07-23 DIAGNOSIS — M810 Age-related osteoporosis without current pathological fracture: Secondary | ICD-10-CM

## 2016-07-24 NOTE — Telephone Encounter (Signed)
Rx sent in. Patient notified via MyChart.  

## 2016-07-28 ENCOUNTER — Other Ambulatory Visit (INDEPENDENT_AMBULATORY_CARE_PROVIDER_SITE_OTHER): Payer: Medicare Other

## 2016-07-28 ENCOUNTER — Ambulatory Visit (INDEPENDENT_AMBULATORY_CARE_PROVIDER_SITE_OTHER): Payer: Medicare Other

## 2016-07-28 VITALS — BP 112/70 | HR 56 | Temp 97.8°F | Ht 66.0 in | Wt 185.2 lb

## 2016-07-28 DIAGNOSIS — R413 Other amnesia: Secondary | ICD-10-CM | POA: Diagnosis not present

## 2016-07-28 DIAGNOSIS — M810 Age-related osteoporosis without current pathological fracture: Secondary | ICD-10-CM | POA: Diagnosis not present

## 2016-07-28 DIAGNOSIS — Z0000 Encounter for general adult medical examination without abnormal findings: Secondary | ICD-10-CM | POA: Diagnosis not present

## 2016-07-28 LAB — COMPREHENSIVE METABOLIC PANEL
ALT: 17 U/L (ref 0–35)
AST: 16 U/L (ref 0–37)
Albumin: 3.7 g/dL (ref 3.5–5.2)
Alkaline Phosphatase: 63 U/L (ref 39–117)
BUN: 15 mg/dL (ref 6–23)
CO2: 31 mEq/L (ref 19–32)
Calcium: 8.9 mg/dL (ref 8.4–10.5)
Chloride: 108 mEq/L (ref 96–112)
Creatinine, Ser: 1.23 mg/dL — ABNORMAL HIGH (ref 0.40–1.20)
GFR: 44.8 mL/min — ABNORMAL LOW (ref >60.00)
Glucose, Bld: 111 mg/dL — ABNORMAL HIGH (ref 70–99)
Potassium: 4 mEq/L (ref 3.5–5.1)
Sodium: 144 mEq/L (ref 135–145)
Total Bilirubin: 0.7 mg/dL (ref 0.2–1.2)
Total Protein: 6.4 g/dL (ref 6.0–8.3)

## 2016-07-28 LAB — VITAMIN D 25 HYDROXY (VIT D DEFICIENCY, FRACTURES): VITD: 36.79 ng/mL (ref 30.00–100.00)

## 2016-07-28 LAB — VITAMIN B12: Vitamin B-12: 499 pg/mL (ref 211–911)

## 2016-07-28 NOTE — Progress Notes (Signed)
Subjective:   [name] BryantHilda A [name] is a 80 y.o. female who presents for Medicare Annual (Subsequent) preventive examination.  Review of Systems:  N/A Cardiac Risk Factors include: advanced age (>6755men, 54>65 women)     Objective:     Vitals: BP 112/70 (BP Location: Right Arm, Patient Position: Sitting, Cuff Size: Normal)   Pulse (!) 56   Temp 97.8 F (36.6 C) (Oral)   Ht 5\' 6"  (1.676 m)   Wt 185 lb 4 oz (84 kg)   SpO2 96%   BMI 29.90 kg/m   Body mass index is 29.9 kg/m.   Tobacco History  Smoking Status  . Never Smoker  Smokeless Tobacco  . Never Used     Counseling given: Not Answered   Past Medical History:  Diagnosis Date  . Achilles tendonitis 10/28/2012  . Alzheimer's type dementia 06/10/2013  . Anxiety   . Depression   . Fibromyalgia   . GERD (gastroesophageal reflux disease)   . Memory loss   . Myasthenia gravis   . Nocardia infection    h/o brain abscess  . Osteoporosis    Past Surgical History:  Procedure Laterality Date  . ABDOMINAL HYSTERECTOMY    . APPENDECTOMY    . BLADDER REPAIR    . BREAST BIOPSY    . CHOLECYSTECTOMY    . TONSILLECTOMY AND ADENOIDECTOMY     Family History  Problem Relation Age of Onset  . Hypertension Brother   . Heart disease Brother   . Heart disease Paternal Uncle   . Hypertension Paternal Uncle   . Breast cancer Mother   . Cancer Mother   . Cirrhosis Father   . Diabetes Neg Hx   . Colon cancer Neg Hx    History  Sexual Activity  . Sexual activity: Yes  . Partners: Male    Outpatient Encounter Prescriptions as of 07/28/2016  Medication Sig  . cholecalciferol (VITAMIN D) 1000 units tablet Take 1,000 Units by mouth daily.  . cyanocobalamin 1000 MCG tablet Take 1,000 mcg by mouth daily.  Marland Kitchen. pyridostigmine (MESTINON) 60 MG tablet Take 60 mg by mouth 2 (two) times daily.  . [DISCONTINUED] predniSONE (DELTASONE) 5 MG tablet    No facility-administered encounter medications on file as of 07/28/2016.     Activities  of Daily Living In your present state of health, do you have any difficulty performing the following activities: 07/28/2016  Hearing? N  Vision? N  Difficulty concentrating or making decisions? Y  Walking or climbing stairs? N  Dressing or bathing? N  Doing errands, shopping? Y  Preparing Food and eating ? N  Using the Toilet? N  In the past six months, have you accidently leaked urine? N  Do you have problems with loss of bowel control? N  Managing your Medications? N  Managing your Finances? N  Housekeeping or managing your Housekeeping? N  Some recent data might be hidden    Patient Care Team: Joaquim NamGraham S Duncan, MD as PCP - General (Family Medicine)    Assessment:     Hearing Screening   125Hz  250Hz  500Hz  1000Hz  2000Hz  3000Hz  4000Hz  6000Hz  8000Hz   Right ear:   0 0 0  0    Left ear:   0 40 0  0      Visual Acuity Screening   Right eye Left eye Both eyes  Without correction:     With correction: 20/40 20/20 20/20     Exercise Activities and Dietary recommendations Current Exercise Habits: The patient  does not participate in regular exercise at present, Exercise limited by: None identified  Goals    . Increase water intake          Starting 07/28/2016, I will attempt to drink at least 6-8 glasses of water daily.       Fall Risk Fall Risk  07/28/2016 05/04/2016 08/02/2015 02/18/2013  Falls in the past year? No No No No   Depression Screen PHQ 2/9 Scores 07/28/2016 08/02/2015 02/18/2013  PHQ - 2 Score 0 0 2     Cognitive Testing MMSE - Mini Mental State Exam 07/28/2016  Orientation to time 5  Orientation to Place 5  Registration 0  Attention/ Calculation 0  Recall 0  Recall-comments pt was unable to recall 3 of 3 words  Language- name 2 objects 0  Language- repeat 1  Language- follow 3 step command 3  Language- read & follow direction 0  Write a sentence 0  Copy design 0  Total score 14   PLEASE NOTE: A Mini-Cog screen was completed. Maximum score is 20. A value  of 0 denotes this part of Folstein MMSE was not completed or the patient failed this part of the Mini-Cog screening.   Mini-Cog Screening Orientation to Time - Max 5 pts Orientation to Place - Max 5 pts Registration - Max 3 pts Recall - Max 3 pts Language Repeat - Max 1 pts Language Follow 3 Step Command - Max 3 pts  Immunization History  Administered Date(s) Administered  . Influenza Split 09/13/2012  . Influenza,inj,Quad PF,36+ Mos 07/23/2013, 08/02/2015  . Pneumococcal Conjugate-13 08/02/2015  . Pneumococcal Polysaccharide-23 02/09/2009  . Td 05/20/2003  . Zoster 09/30/2007   Screening Tests Health Maintenance  Topic Date Due  . INFLUENZA VACCINE  08/04/2016 (Originally 05/30/2016)  . DEXA SCAN  Completed  . ZOSTAVAX  Completed  . PNA vac Low Risk Adult  Completed      Plan:     I have personally reviewed and addressed the Medicare Annual Wellness questionnaire and have noted the following in the patient's chart:  A. Medical and social history B. Use of alcohol, tobacco or illicit drugs  C. Current medications and supplements D. Functional ability and status E.  Nutritional status F.  Physical activity G. Advance directives H. List of other physicians I.  Hospitalizations, surgeries, and ER visits in previous 12 months J.  Vitals K. Screenings to include hearing, vision, cognitive, depression L. Referrals and appointments - none  In addition, I have reviewed and discussed with patient certain preventive protocols, quality metrics, and best practice recommendations. A written personalized care plan for preventive services as well as general preventive health recommendations were provided to patient.  See attached scanned questionnaire for additional information.   Signed,   Randa Evens, MHA, BS, LPN Health Advisor

## 2016-07-28 NOTE — Patient Instructions (Signed)
[name] [name] , Thank you for taking time to come for your Medicare Wellness Visit. I appreciate your ongoing commitment to your health goals. Please review the following plan we discussed and let me know if I can assist you in the future.   These are the goals we discussed: Goals    . Increase water intake          Starting 07/28/2016, I will attempt to drink at least 6-8 glasses of water daily.        This is a list of the screening recommended for you and due dates:  Health Maintenance  Topic Date Due  . Flu Shot  08/04/2016*  . DEXA scan (bone density measurement)  Completed  . Shingles Vaccine  Completed  . Pneumonia vaccines  Completed  *Topic was postponed. The date shown is not the original due date.   Preventive Care for Adults  A healthy lifestyle and preventive care can promote health and wellness. Preventive health guidelines for adults include the following key practices.  . A routine yearly physical is a good way to check with your health care provider about your health and preventive screening. It is a chance to share any concerns and updates on your health and to receive a thorough exam.  . Visit your dentist for a routine exam and preventive care every 6 months. Brush your teeth twice a day and floss once a day. Good oral hygiene prevents tooth decay and gum disease.  . The frequency of eye exams is based on your age, health, family medical history, use  of contact lenses, and other factors. Follow your health care provider's ecommendations for frequency of eye exams.  . Eat a healthy diet. Foods like vegetables, fruits, whole grains, low-fat dairy products, and lean protein foods contain the nutrients you need without too many calories. Decrease your intake of foods high in solid fats, added sugars, and salt. Eat the right amount of calories for you. Get information about a proper diet from your health care provider, if necessary.  . Regular physical exercise is one of  the most important things you can do for your health. Most adults should get at least 150 minutes of moderate-intensity exercise (any activity that increases your heart rate and causes you to sweat) each week. In addition, most adults need muscle-strengthening exercises on 2 or more days a week.  Silver Sneakers may be a benefit available to you. To determine eligibility, you may visit the website: www.silversneakers.com or contact program at [phone] Mon-Fri between 8AM-8PM.   . Maintain a healthy weight. The body mass index (BMI) is a screening tool to identify possible weight problems. It provides an estimate of body fat based on height and weight. Your health care provider can find your BMI and can help you achieve or maintain a healthy weight.   For adults 20 years and older: ? A BMI below 18.5 is considered underweight. ? A BMI of 18.5 to 24.9 is normal. ? A BMI of 25 to 29.9 is considered overweight. ? A BMI of 30 and above is considered obese.   . Maintain normal blood lipids and cholesterol levels by exercising and minimizing your intake of saturated fat. Eat a balanced diet with plenty of fruit and vegetables. Blood tests for lipids and cholesterol should begin at age 120 and be repeated every 5 years. If your lipid or cholesterol levels are high, you are over 50, or you are at high risk for heart disease, you  may need your cholesterol levels checked more frequently. Ongoing high lipid and cholesterol levels should be treated with medicines if diet and exercise are not working.  . If you smoke, find out from your health care provider how to quit. If you do not use tobacco, please do not start.  . If you choose to drink alcohol, please do not consume more than 2 drinks per day. One drink is considered to be 12 ounces (355 mL) of beer, 5 ounces (148 mL) of wine, or 1.5 ounces (44 mL) of liquor.  . If you are 59-72 years old, ask your health care provider if you should take aspirin to  prevent strokes.  . Use sunscreen. Apply sunscreen liberally and repeatedly throughout the day. You should seek shade when your shadow is shorter than you. Protect yourself by wearing long sleeves, pants, a wide-brimmed hat, and sunglasses year round, whenever you are outdoors.  . Once a month, do a whole body skin exam, using a mirror to look at the skin on your back. Tell your health care provider of new moles, moles that have irregular borders, moles that are larger than a pencil eraser, or moles that have changed in shape or color.

## 2016-07-28 NOTE — Progress Notes (Signed)
Pre visit review using our clinic review tool, if applicable. No additional management support is needed unless otherwise documented below in the visit note. 

## 2016-07-28 NOTE — Progress Notes (Signed)
PCP notes:   Health maintenance:  Flu vaccine - pt requested to get vaccine at CPE  Abnormal screenings:   Hearing - failed Mini-Cog score: 14/20  Patient concerns:   None  Nurse concerns:  None  Next PCP appt:   08/04/2016 @ 0945

## 2016-07-30 NOTE — Progress Notes (Signed)
I reviewed health advisor's note, was available for consultation, and agree with documentation and plan.  

## 2016-08-04 ENCOUNTER — Encounter: Payer: Self-pay | Admitting: Family Medicine

## 2016-08-04 ENCOUNTER — Ambulatory Visit (INDEPENDENT_AMBULATORY_CARE_PROVIDER_SITE_OTHER): Payer: Medicare Other | Admitting: Family Medicine

## 2016-08-04 VITALS — BP 122/78 | HR 72 | Temp 97.4°F | Wt 188.0 lb

## 2016-08-04 DIAGNOSIS — Z23 Encounter for immunization: Secondary | ICD-10-CM | POA: Diagnosis not present

## 2016-08-04 DIAGNOSIS — F0280 Dementia in other diseases classified elsewhere without behavioral disturbance: Secondary | ICD-10-CM

## 2016-08-04 DIAGNOSIS — G7000 Myasthenia gravis without (acute) exacerbation: Secondary | ICD-10-CM | POA: Diagnosis not present

## 2016-08-04 DIAGNOSIS — G309 Alzheimer's disease, unspecified: Secondary | ICD-10-CM | POA: Diagnosis not present

## 2016-08-04 DIAGNOSIS — Z0000 Encounter for general adult medical examination without abnormal findings: Secondary | ICD-10-CM

## 2016-08-04 MED ORDER — PREDNISONE 5 MG PO TABS: 7.5000 mg | ORAL_TABLET | Freq: Every day | ORAL | Status: DC

## 2016-08-04 NOTE — Progress Notes (Signed)
Flu vaccine -done today at OV.   Mammogram up to date.   DXA.  D/w pt.  She declined DXA now since she wouldn't want extra tx now, given prev intolerances.  Husband agrees.   She hasn't fallen, routine cautions d/w pt.   Abnormal screenings:  Hearing screen - failed.  Declined hearing aids.    MG per neuro.  Still on prednisone.  No sx currently.  Doing well with baseline meds.  No heartburn recently.  D/w pt and husband.    Memory loss. D/w pt.  She can get irritated when she forgets details.  Husband thinks she is at baseline.  Recent mini-Cog score: 14/20.  Prev followed by neurology.  Prev failed aricept tx.  She isn't driving, husband is.    PMH and SH reviewed  ROS: Per HPI unless specifically indicated in ROS section   Meds, vitals, and allergies reviewed.   GEN: nad, alert, pleasant, but she frequently repeats herself/questions and didn't recall most of the recent AMW visit.   HEENT: mucous membranes moist, OP wnl NECK: supple w/o LA CV: rrr.  no murmur PULM: ctab, no inc wob ABD: soft, +bs EXT: no edema SKIN: no acute rash

## 2016-08-04 NOTE — Progress Notes (Signed)
Pre visit review using our clinic review tool, if applicable. No additional management support is needed unless otherwise documented below in the visit note. 

## 2016-08-04 NOTE — Patient Instructions (Signed)
We talked about fall precautions today.   Don't change your meds for now.  Update me as needed.  Take care.  Glad to see you.

## 2016-08-05 DIAGNOSIS — Z0000 Encounter for general adult medical examination without abnormal findings: Secondary | ICD-10-CM | POA: Insufficient documentation

## 2016-08-05 NOTE — Assessment & Plan Note (Signed)
Mammogram up to date.   DXA.  D/w pt.  She declined DXA now since she wouldn't want extra tx now, given prev intolerances.  Husband agrees.   She hasn't fallen, routine cautions d/w pt.

## 2016-08-05 NOTE — Assessment & Plan Note (Signed)
Stable. No significant weakness. Still on prednisone. At this point she declined a bone density testing. This is reasonable, considering that she does not want to take extra medications and previously failed treatment with alendronate. Continue mestinon.. She has had routine follow-up with neurology. Update me as needed. >25 minutes spent in face to face time with patient, >50% spent in counselling or coordination of care.

## 2016-08-05 NOTE — Assessment & Plan Note (Signed)
Her memory loss is relatively stable. She failed treatment with Aricept previously. She is not driving. Her husband is still managing her medications and driving for her. She is able to ambulate, express her needs, eat her meals, but she cannot likely function independently in complex situations i.e. she is not able to drive. I would continue as is for now. All over my support to both her and her husband. They can update me as needed.

## 2017-02-05 ENCOUNTER — Other Ambulatory Visit: Payer: Self-pay | Admitting: Neurology

## 2017-05-04 ENCOUNTER — Ambulatory Visit (INDEPENDENT_AMBULATORY_CARE_PROVIDER_SITE_OTHER): Payer: Medicare Other | Admitting: Neurology

## 2017-05-04 ENCOUNTER — Encounter: Payer: Self-pay | Admitting: Neurology

## 2017-05-04 VITALS — BP 110/70 | HR 59 | Ht 67.0 in | Wt 185.6 lb

## 2017-05-04 DIAGNOSIS — F0280 Dementia in other diseases classified elsewhere without behavioral disturbance: Secondary | ICD-10-CM | POA: Diagnosis not present

## 2017-05-04 DIAGNOSIS — G309 Alzheimer's disease, unspecified: Secondary | ICD-10-CM | POA: Diagnosis not present

## 2017-05-04 DIAGNOSIS — G7000 Myasthenia gravis without (acute) exacerbation: Secondary | ICD-10-CM | POA: Diagnosis not present

## 2017-05-04 MED ORDER — MEMANTINE HCL 5 MG PO TABS: ORAL_TABLET | ORAL | 3 refills | Status: DC

## 2017-05-04 MED ORDER — PYRIDOSTIGMINE BROMIDE 60 MG PO TABS: 60.0000 mg | ORAL_TABLET | Freq: Three times a day (TID) | ORAL | 3 refills | Status: DC

## 2017-05-04 MED ORDER — PREDNISONE 5 MG PO TABS: 7.5000 mg | ORAL_TABLET | Freq: Every day | ORAL | 3 refills | Status: DC

## 2017-05-04 NOTE — Progress Notes (Signed)
Follow-up Visit   Date: 05/04/17   [name] BryantHilda A Virrueta MRN: [id number] DOB: 1936/10/25   Interim History: [name] [name] is a 81 y.o. right-handed Caucasian female with history of ocular myasthenia gravis (diagnosed 2007, antibody positive), history left nocardia brain with right hemiparesis which has since resolved (1983), and memory impairment returning to the clinic for follow-up of memory loss and MG.  The patient was accompanied to the clinic by two daughters who also provides collateral information.    History of present illness: She was seeing Dr. Sandria ManlyLove since 2009 for ocular myasthenia which presented in 2007 with diplopia, left ptosis, and jaw fatigue. It seems that symptoms were being managed with mestinon 120/60/60 for quite some time. Around early 2015, she started having frequent UTIs and was given ciprofloxacin and developed problems with double vision and right ptosis. Her care was transitioned to Dr. Anne HahnWillis after Dr. Sandria ManlyLove retired and at his last visit in March 2015, prednisone 10mg  was started in addition to mestinon 120mg  TID. Since then, she reports being relatively stable. She continues to have ptosis on occasion, which typically improves after taking mestinon. She denies any shortness of breath, difficulty swallowing/talking, or limb weakness. She has never been hospitalized due to MG. She currently takes prednisone 10mg  and mestinon 120mg  (10am, 2pm, 6pm).  Family reports that she is having steady decline in her memory over the past few years. She is still able to do her ADLs and IADLs including finances and driving, but does endorse mild difficulty. She seldom drives and restricts herself to local distances only. Hobbies include playing piano and reading. She had neuropsychological evaluation in 2014 which showed mild cognitive impairment.  UPDATE 09/22/2014:  She underwent neuropsychiatric testing which was consistent with mild demential due to Alzheimer's disease.  At  her last visit, I recommended tapering her mestinon, but when she reduced it to 120-60-60, she "did not feel well" so she increased it back to 120mg  TID.  UPDATE 01/05/2015:  She is taking mestinon 120mg  TID and reports when she reduced the does she "did not feel well", but denies any weakness.  She is feeling well and has no new complaints.  Her daughters mentioned that her memory has no changed despite started aricept and they have noticed that she is for argumentative with their father.  No signs of inappropriate laughter or crying.   UPDATE 02/17/2015:  She was able to reduce mestinon to 60mg  three times daily and did not notice any worsening of symptoms.   She complains of loose stool after eating.  She has not bowel incontinence or pain, but describes urgency.  She has not seen her PCP for this.   UPDATE 04/19/2015:  At her last visit, instruction were given to taper her prednisone by 1mg  each month, she is currently on 8mg  and doing well.  She had not noticed any problems with her vision, facial weakness, or droopy eyelid.  She continues to have diarrhea and daughter has noticed a few accidents in public, so was asking about potential side effects, specifically Aricept.   UPDATE 07/22/2015:  She went down to prednisone 6mg  in August and a few days later, she started noticed right droopy eyelid so then increased it back to prednisone 7mg  daily.  She has occasional double vision. No problems with swallowing or slurred speech.    UPDATE 11/05/2015:  Within 3 weeks of increasing prednisone 7.5mg , her ptosis resolved and she has been well.  No new issues with double vision,  difficulty with swallowing/talking.  Sometimes, she forget to takes her afternoon mestinon, but has not experienced any breakthrough weakness. Mood and memory is stable.  Family have not noticed any worsening of her memory.  She has not been driving at the recommendation of her neuropsychological testing, but is quite upset that she cannot.     UPDATE 05/04/2016:  She denies any problems with myasthenia.  She is compliant with her medications and continues to take prednisone 7.5mg  and mestinon 60mg  twice daily.  She does not need to take an extra dose of mestinon and has been stable.  She has noticed intermittent chest burning which is worse in the morning when she wakes up.  Her daughter not noticed new changes with her memory.  She is not driving anymore.  She co-manages finances and medications with her husband.  UPDATE 05/04/2017:  She is here for 1 year follow-up visit.  Her myasthenia is extremely well controlled on prednisone 7.5mg  and mestinon TID without any spells of ptosis or double vision over the past year.   Her daughter has noticed gradual worsening of memory.  She did not know she had an appointment today, despite being reminded several times on her trip here.  Daughter states that she has poor hearing, but will not get it evaluated.  Patient does not feel that her memory is a problem and gets defensive when this is mentioned by her family.  She is less active and does not have many hobbies.  She meets her sister twice per week and they go out, but otherwise does not engage in any hobbies. She stopped driving two years ago.  Her daughter continues to manage her medications.   Allergies:  Allergies  Allergen Reactions  . Alendronate Sodium     REACTION: upset stomach  . Donepezil Hcl     intolerant  . Sulfa Antibiotics     rash  . Tetanus Toxoids     rash  . Vesicare [Solifenacin]     dysuria     Review of Systems:  CONSTITUTIONAL: No fevers, chills, night sweats, or weight loss.   EYES: No visual changes or eye pain ENT: No hearing changes.  No history of nose bleeds.   RESPIRATORY: No cough, wheezing and shortness of breath.   CARDIOVASCULAR: Negative for chest pain, and palpitations.   GI: Negative for abdominal discomfort, blood in stools or black stools.  No recent change in bowel habits.   GU:  No history of  incontinence.   MUSCLOSKELETAL: No history of joint pain or swelling.  No myalgias.   SKIN: Negative for lesions, rash, and itching.   ENDOCRINE: Negative for cold or heat intolerance, polydipsia or goiter.   PSYCH:  No depression or anxiety symptoms.   NEURO: As Above.   Vital Signs:  BP 110/70   Pulse (!) 59   Ht 5\' 7"  (1.702 m)   Wt 185 lb 9 oz (84.2 kg)   SpO2 95%   BMI 29.06 kg/m   Neurological Exam: MENTAL STATUS including orientation to time, place, person, recent and remote memory, attention span and concentration, language, and fund of knowledge is fairly intact.  Speech is not dysarthric.  Montreal Cognitive Assessment  05/04/2017 05/04/2016 07/14/2014  Visuospatial/ Executive (0/5) 4 5 3   Naming (0/3) 3 3 3   Attention: Read list of digits (0/2) 2 2 2   Attention: Read list of letters (0/1) 1 1 1   Attention: Serial 7 subtraction starting at 100 (0/3) 1 3 3  Language: Repeat phrase (0/2) 1 2 2   Language : Fluency (0/1) 1 0 1  Abstraction (0/2) 1 1 1   Delayed Recall (0/5) 0 0 0  Orientation (0/6) 4 5 5   Total 18 22 21   Adjusted Score (based on education) 18 22 21     CRANIAL NERVES:  Pupils equal round and reactive to light.  Normal conjugate, extra-ocular eye movements in all directions of gaze.  No ptosis with sustained up gaze.  Facial muscles are 5/5.  Face is symmetric. Palate elevates symmetrically.  Tongue is midline.  MOTOR:  Motor strength is 5/5 in all extremities.  Normal tone.  REFLEXES:  2+/4 throughout  SENSORY:  Vibration intact throughout  GAIT:  Narrow based and stable.    Data: Lab Results  Component Value Date   TSH 0.58 05/10/2010   Labs 07/14/2014:  Vitamin E 2.1, B12 333  Neuropsychiatric testing 02/18/2013: neuropsychological testing that showed evidence of minimum cognitive impairment. The patient did have some difficulty remembering names for people. Overall, the cognitive impairment was not severe.  [facility] at [facility]  07/30/2014:  Mild dementia due to Alzheimer's dementia.  MRI brain 12/25/2012: Scattered white matter changes, mild perisylvian atrophy.   IMPRESSION/PLAN: 1. Seropositive myasthenia gravis, diagnosed 2007 - stable - Manifested with predominately ocular symptoms and have not generalized - Exam stable, without evidence of bulbar weakness - Continue prednisone 7.5mg  daily.  She had breakthrough symptoms when prednisone was reduced to 6mg  previously.  - Continue mestinon to 60mg  three daily  2. Alzheimer's dementia, moderate (dx 2015) and worsening.  She does own ADLs, but is more dependent on IADLs.  Husband is POA  - Did not tolerate Aricept  - Start Namenda 5mg  and titrate to 10mg  BID over 4 weeks  - There are no home safety concerns at this time  - Recommend she start an exercise program of walking 30-min 3 times weekly and engage in mentally stimulating activities  Return to clinic in 1 year  The duration of this appointment visit was 35 minutes of face-to-face time with the patient.  Greater than 50% of this time was spent in counseling, explanation of diagnosis, planning of further management, and coordination of care.   Thank you for allowing me to participate in patient's care.  If I can answer any additional questions, I would be pleased to do so.    Sincerely,    Muhammad Vacca K. Allena Katz, DO

## 2017-05-04 NOTE — Patient Instructions (Addendum)
1.  Start Namenda    AM PM  Week 1  5mg   Week 2 5mg  5mg   Week 3 5mg  10mg   Week 4 10mg  10mg   2.  Continue prednisone 7.5mg  daily and mestinon 60mg  three times daily  Return to clinic in 1 year

## 2017-05-28 ENCOUNTER — Encounter: Payer: Self-pay | Admitting: Neurology

## 2017-05-29 ENCOUNTER — Ambulatory Visit (INDEPENDENT_AMBULATORY_CARE_PROVIDER_SITE_OTHER): Payer: Medicare Other | Admitting: Family Medicine

## 2017-05-29 ENCOUNTER — Encounter: Payer: Self-pay | Admitting: Family Medicine

## 2017-05-29 DIAGNOSIS — R21 Rash and other nonspecific skin eruption: Secondary | ICD-10-CM

## 2017-05-29 MED ORDER — TRIAMCINOLONE ACETONIDE 0.5 % EX CREA: 1.0000 "application " | TOPICAL_CREAM | Freq: Two times a day (BID) | CUTANEOUS | 1 refills | Status: DC | PRN

## 2017-05-29 NOTE — Progress Notes (Signed)
Itching and rash.  Mildly itchy but on prednisone at baseline.  Started about 3-4 days ago.  Had been out in the yard.  Blanching nondermatomal B small red lesions on the trunk. No FCNAVD.  Her sister had similar.  She had a potential exposure to fleas with there sister's dog.  No new triggers or foods or soaps.   Meds, vitals, and allergies reviewed.   ROS: Per HPI unless specifically indicated in ROS section   GEN: nad, alert, she repeats herself.   HEENT: mucous membranes moist NECK: supple w/o LA CV: rrr.  PULM: ctab, no inc wob EXT: no edema SKIN: Diffuse nondermatomal isolated blanching small red lesions noted across the lower abdomen and back. No lesions on the palms of hands or in between the fingers. No ulceration, no pustules, no fluctuant mass.

## 2017-05-29 NOTE — Patient Instructions (Signed)
Continue prednisone as is.  Use the triamcinolone cream in the meantime and the spots should heal over.  Take care.  Glad to see you.  Update me as needed.

## 2017-05-30 NOTE — Assessment & Plan Note (Signed)
Potential exposure to fleas. No other clear trigger. Should resolve. None appear to be actively infected. Use triamcinolone topically as needed. She is already taking prednisone for myasthenia gravis. This likely masks some of the itching already. Discussed with patient and husband. Update me as needed.

## 2017-07-02 ENCOUNTER — Encounter: Payer: Self-pay | Admitting: Neurology

## 2017-07-06 ENCOUNTER — Telehealth: Payer: Self-pay | Admitting: Family Medicine

## 2017-07-06 NOTE — Telephone Encounter (Signed)
Called pt. No answer, No vm  Pt due for AWV + labs with Virl AxeLesia and CPE with PCP.  *NOTE* Last AWV 07/28/16; please schedule 07/29/17 or after

## 2017-07-20 ENCOUNTER — Other Ambulatory Visit: Payer: Self-pay | Admitting: Neurology

## 2017-08-08 NOTE — Telephone Encounter (Signed)
Left pt message asking to call Revonda Standard back directly at [phone] to schedule AWV + labs with Virl Axe and CPE with PCP.  *NOTE* Last AWV 07/28/16; can schedule at anytime

## 2017-08-13 ENCOUNTER — Encounter: Payer: Self-pay | Admitting: Family Medicine

## 2017-08-13 ENCOUNTER — Ambulatory Visit (INDEPENDENT_AMBULATORY_CARE_PROVIDER_SITE_OTHER): Payer: Medicare Other | Admitting: Family Medicine

## 2017-08-13 DIAGNOSIS — H6690 Otitis media, unspecified, unspecified ear: Secondary | ICD-10-CM | POA: Diagnosis not present

## 2017-08-13 MED ORDER — AMOXICILLIN-POT CLAVULANATE 875-125 MG PO TABS: 1.0000 | ORAL_TABLET | Freq: Two times a day (BID) | ORAL | 0 refills | Status: DC

## 2017-08-13 NOTE — Patient Instructions (Signed)
Don't use qtips.  Start augmentin and update me as needed.  Take care.  Glad to see you.

## 2017-08-13 NOTE — Progress Notes (Signed)
R ear pain the last few days.  Pain in the canal and hearing is affected.  No FCNAVD.  She doesn't feel unwell.  No L ear complaints.   Meds, vitals, and allergies reviewed.   ROS: Per HPI unless specifically indicated in ROS section   nad ncat R TM red.  L TM wnl B canals and pinnas wnl Nasal and OP exam wnl

## 2017-08-14 DIAGNOSIS — H6690 Otitis media, unspecified, unspecified ear: Secondary | ICD-10-CM | POA: Insufficient documentation

## 2017-08-14 NOTE — Assessment & Plan Note (Signed)
Discussed with patient. Start Augmentin. Avoid Q-tip use. No obvious trauma to the canal. Follow-up as needed.

## 2017-08-20 DIAGNOSIS — H9201 Otalgia, right ear: Secondary | ICD-10-CM | POA: Insufficient documentation

## 2017-08-20 DIAGNOSIS — H6123 Impacted cerumen, bilateral: Secondary | ICD-10-CM | POA: Insufficient documentation

## 2017-08-20 DIAGNOSIS — B369 Superficial mycosis, unspecified: Secondary | ICD-10-CM | POA: Insufficient documentation

## 2017-08-24 ENCOUNTER — Other Ambulatory Visit: Payer: Self-pay | Admitting: Family Medicine

## 2017-08-24 DIAGNOSIS — Z1231 Encounter for screening mammogram for malignant neoplasm of breast: Secondary | ICD-10-CM

## 2017-09-14 ENCOUNTER — Ambulatory Visit
Admission: RE | Admit: 2017-09-14 | Discharge: 2017-09-14 | Disposition: A | Discharge status: Home / Self Care | Payer: Medicare Other | Source: Ambulatory Visit | Attending: Family Medicine | Admitting: Family Medicine

## 2017-09-14 DIAGNOSIS — Z1231 Encounter for screening mammogram for malignant neoplasm of breast: Secondary | ICD-10-CM

## 2017-09-17 ENCOUNTER — Encounter: Payer: Self-pay | Admitting: *Deleted

## 2017-11-19 ENCOUNTER — Encounter: Payer: Medicare Other | Admitting: Family Medicine

## 2017-11-19 ENCOUNTER — Encounter: Payer: Self-pay | Admitting: Family Medicine

## 2017-11-19 ENCOUNTER — Ambulatory Visit (INDEPENDENT_AMBULATORY_CARE_PROVIDER_SITE_OTHER): Payer: Medicare Other | Admitting: Family Medicine

## 2017-11-19 VITALS — BP 108/72 | HR 60 | Temp 97.7°F | Ht 67.0 in | Wt 183.5 lb

## 2017-11-19 DIAGNOSIS — F0280 Dementia in other diseases classified elsewhere without behavioral disturbance: Secondary | ICD-10-CM

## 2017-11-19 DIAGNOSIS — G309 Alzheimer's disease, unspecified: Secondary | ICD-10-CM

## 2017-11-19 DIAGNOSIS — Z0000 Encounter for general adult medical examination without abnormal findings: Secondary | ICD-10-CM

## 2017-11-19 DIAGNOSIS — E781 Pure hyperglyceridemia: Secondary | ICD-10-CM

## 2017-11-19 DIAGNOSIS — G7000 Myasthenia gravis without (acute) exacerbation: Secondary | ICD-10-CM

## 2017-11-19 DIAGNOSIS — M810 Age-related osteoporosis without current pathological fracture: Secondary | ICD-10-CM

## 2017-11-19 DIAGNOSIS — Z7189 Other specified counseling: Secondary | ICD-10-CM

## 2017-11-19 DIAGNOSIS — Z23 Encounter for immunization: Secondary | ICD-10-CM

## 2017-11-19 LAB — COMPREHENSIVE METABOLIC PANEL
ALT: 15 U/L (ref 0–35)
AST: 17 U/L (ref 0–37)
Albumin: 4.1 g/dL (ref 3.5–5.2)
Alkaline Phosphatase: 68 U/L (ref 39–117)
BUN: 15 mg/dL (ref 6–23)
CO2: 30 mEq/L (ref 19–32)
Calcium: 9.4 mg/dL (ref 8.4–10.5)
Chloride: 105 mEq/L (ref 96–112)
Creatinine, Ser: 1.19 mg/dL (ref 0.40–1.20)
GFR: 46.39 mL/min — ABNORMAL LOW (ref >60.00)
Glucose, Bld: 142 mg/dL — ABNORMAL HIGH (ref 70–99)
Potassium: 4.4 mEq/L (ref 3.5–5.1)
Sodium: 142 mEq/L (ref 135–145)
Total Bilirubin: 0.8 mg/dL (ref 0.2–1.2)
Total Protein: 6.8 g/dL (ref 6.0–8.3)

## 2017-11-19 LAB — LIPID PANEL
Cholesterol: 200 mg/dL (ref 0–200)
HDL: 59.9 mg/dL (ref >39.00)
LDL Cholesterol: 111 mg/dL — ABNORMAL HIGH (ref 0–99)
NonHDL: 140.41
Total CHOL/HDL Ratio: 3
Triglycerides: 147 mg/dL (ref 0.0–149.0)
VLDL: 29.4 mg/dL (ref 0.0–40.0)

## 2017-11-19 LAB — VITAMIN D 25 HYDROXY (VIT D DEFICIENCY, FRACTURES): VITD: 40.32 ng/mL (ref 30.00–100.00)

## 2017-11-19 MED ORDER — MEMANTINE HCL 5 MG PO TABS: 5.0000 mg | ORAL_TABLET | Freq: Two times a day (BID) | ORAL | Status: DC

## 2017-11-19 NOTE — Progress Notes (Signed)
I have personally reviewed the Medicare Annual Wellness questionnaire and have noted 1. The patient's medical and social history 2. Their use of alcohol, tobacco or illicit drugs 3. Their current medications and supplements 4. The patient's functional ability including ADL's, fall risks, home safety risks and hearing or visual             impairment. 5. Diet and physical activities 6. Evidence for depression or mood disorders  The patients weight, height, BMI have been recorded in the chart and visual acuity is per eye clinic.  I have made referrals, counseling and provided education to the patient based review of the above and I have provided the pt with a written personalized care plan for preventive services.  Provider list updated- see scanned forms.  Routine anticipatory guidance given to patient.  See health maintenance. The possibility exists that previously documented standard health maintenance information may have been brought forward from a previous encounter into this note.  If needed, that same information has been updated to reflect the current situation based on today's encounter.    Flu 2019 Shingles prev done  PNA 2016  Tetanus 2004  Colonoscopy 2011  Breast cancer screening 2015  DXA deferred for now. She doesn't want further eval or tx.  She didn't tolerate tx prev.  Advance directive- husband or either daughter designated if patient were incapacitated.  Cognitive function addressed- see scanned forms- and if abnormal then additional documentation follows-patient is oriented to self but not the year or the day.  She does know it is January.  She repeats herself frequently during conversation.  She only recalls 1 out of 3 objects on short-term testing.  Recheck pulse 60.    History of myasthenia gravis and dementia followed by outside clinic.  She is compliant with her medications.  Husband monitors her medications for her.  She has no new weakness.  She needs help with  his driving, medication maintenance, help around the house, etc.  Her husband is compensating for her memory deficits.  Routine f/u labs pending re: hypertriglyceridemia and osteoporosis.  See notes on labs.   PMH and SH reviewed  Meds, vitals, and allergies reviewed.   ROS: Per HPI.  Unless specifically indicated otherwise in HPI, the patient denies:  General: fever. Eyes: acute vision changes ENT: sore throat Cardiovascular: chest pain Respiratory: SOB GI: vomiting GU: dysuria Musculoskeletal: acute back pain Derm: acute rash Neuro: acute motor dysfunction Psych: worsening mood Endocrine: polydipsia Heme: bleeding Allergy: hayfever  GEN: nad, alert and but not fully oriented- at baseline.  HEENT: mucous membranes moist NECK: supple w/o LA CV: rrr. PULM: ctab, no inc wob ABD: soft, +bs EXT: no edema SKIN: no acute rash

## 2017-11-19 NOTE — Patient Instructions (Signed)
Don't change your meds for now.  Flu shot today.  Go to the lab on the way out.  We'll contact you with your lab report. Take care.  Glad to see you.

## 2017-11-20 DIAGNOSIS — G7000 Myasthenia gravis without (acute) exacerbation: Secondary | ICD-10-CM | POA: Insufficient documentation

## 2017-11-20 NOTE — Assessment & Plan Note (Signed)
Per neuro, I'll defer.

## 2017-11-20 NOTE — Assessment & Plan Note (Signed)
Flu 2019 Shingles prev done  PNA 2016  Tetanus 2004  Colonoscopy 2011  Breast cancer screening 2015  DXA deferred for now. She doesn't want further eval or tx.  She didn't tolerate tx prev.  Advance directive- husband or either daughter designated if patient were incapacitated.  Cognitive function addressed- see scanned forms- and if abnormal then additional documentation follows-patient is oriented to self but not the year or the day.  She does know it is January.  She repeats herself frequently during conversation.  She only recalls 1 out of 3 objects on short-term testing.

## 2017-11-20 NOTE — Assessment & Plan Note (Signed)
History of myasthenia gravis and dementia followed by outside clinic.  She is compliant with her medications.  Husband monitors her medications for her.  She has no new weakness.  She needs help with his driving, medication maintenance, help around the house, etc.  Her husband is compensating for her memory deficits.

## 2017-11-20 NOTE — Assessment & Plan Note (Signed)
Advance directive- husband or either daughter designated if patient were incapacitated.  

## 2017-11-25 ENCOUNTER — Other Ambulatory Visit: Payer: Self-pay | Admitting: Family Medicine

## 2017-11-25 DIAGNOSIS — R739 Hyperglycemia, unspecified: Secondary | ICD-10-CM

## 2017-11-27 ENCOUNTER — Other Ambulatory Visit (INDEPENDENT_AMBULATORY_CARE_PROVIDER_SITE_OTHER): Payer: Medicare Other

## 2017-11-27 DIAGNOSIS — R739 Hyperglycemia, unspecified: Secondary | ICD-10-CM

## 2017-11-27 LAB — HEMOGLOBIN A1C: Hgb A1c MFr Bld: 6.1 % (ref 4.6–6.5)

## 2018-02-18 ENCOUNTER — Ambulatory Visit: Payer: Self-pay

## 2018-02-18 NOTE — Telephone Encounter (Signed)
Pt.'s daughter called to report pt. Has "a hiatal hernia and she is having some pain from it. It hurts when she first gets up in the morning." Has pain in the middle of her chest. Reports pt. Has dementia so it "is hard to get information from her." Appointment made for tomorrow as requested. Instructed to call 911 if she has more intense chest pain.  Reason for Disposition . [1] Chest pain(s) lasting a few seconds AND [2] persists > 3 days  Answer Assessment - Initial Assessment Questions 1. LOCATION: "Where does it hurt?"       Middle of chest 2. RADIATION: "Does the pain go anywhere else?" (e.g., into neck, jaw, arms, back)     No 3. ONSET: "When did the chest pain begin?" (Minutes, hours or days)      Started Saturday 4. PATTERN "Does the pain come and go, or has it been constant since it started?"  "Does it get worse with exertion?"      Comes and goes 5. DURATION: "How long does it last" (e.g., seconds, minutes, hours)     A few seconds 6. SEVERITY: "How bad is the pain?"  (e.g., Scale 1-10; mild, moderate, or severe)    - MILD (1-3): doesn't interfere with normal activities     - MODERATE (4-7): interferes with normal activities or awakens from sleep    - SEVERE (8-10): excruciating pain, unable to do any normal activities       Mild 7. CARDIAC RISK FACTORS: "Do you have any history of heart problems or risk factors for heart disease?" (e.g., prior heart attack, angina; high blood pressure, diabetes, being overweight, high cholesterol, smoking, or strong family history of heart disease)     No 8. PULMONARY RISK FACTORS: "Do you have any history of lung disease?"  (e.g., blood clots in lung, asthma, emphysema, birth control pills)     No 9. CAUSE: "What do you think is causing the chest pain?"     Hiatal hernia 10. OTHER SYMPTOMS: "Do you have any other symptoms?" (e.g., dizziness, nausea, vomiting, sweating, fever, difficulty breathing, cough)       No 11. PREGNANCY: "Is there any  chance you are pregnant?" "When was your last menstrual period?"       No  Protocols used: CHEST PAIN-A-AH

## 2018-02-19 ENCOUNTER — Encounter: Payer: Self-pay | Admitting: Family Medicine

## 2018-02-19 ENCOUNTER — Ambulatory Visit: Payer: Medicare Other | Admitting: Family Medicine

## 2018-02-19 DIAGNOSIS — K219 Gastro-esophageal reflux disease without esophagitis: Secondary | ICD-10-CM

## 2018-02-19 MED ORDER — OMEPRAZOLE 20 MG PO CPDR: 20.0000 mg | DELAYED_RELEASE_CAPSULE | Freq: Every day | ORAL | Status: AC

## 2018-02-19 NOTE — Patient Instructions (Signed)
Start taking prilosec OTC 1 a day.  If you have more pain, then use mylanta as needed.  If continued or exertional episodes, then update me.  Try to elevate the head of the bed.  Don't lay down right after eating.  Take care.  Glad to see you.

## 2018-02-19 NOTE — Progress Notes (Signed)
Still on prednisone due to MG.  She is on 7.5mg  a day.    Here today with daughter who helps with hx.  Patient reported h/o hiatal hernia.  Patient has had episodic CP, most recently this past weekend.  No sx now.  Sx were early AM, not exertional.  She is active w/o chest pain during the day o/w.  No fevers.  No chills.  No vomiting.  No food sticking or dysphagia.  No blood in stool.  It was prev described by patient as near sternum but she doesn't recall the sx now.  Unclear how many episodes have happened.  Not SOB now or prev.   Meds, vitals, and allergies reviewed.   ROS: Per HPI unless specifically indicated in ROS section   GEN: nad, alert but she repeats her self.  Pleasant in conversation. HEENT: mucous membranes moist NECK: supple w/o LA CV: rrr PULM: ctab, no inc wob ABD: soft, +bs, not ttp  EXT: no edema SKIN: no acute rash

## 2018-02-20 NOTE — Assessment & Plan Note (Signed)
She has no pain now.  She has no reported history of exertional symptoms.  I do not think this is cardiac.  I think it is way more likely to be GERD related with ongoing prednisone use.  Discussed with patient and her daughter about options.  Would add on Prilosec daily and then use Mylanta if needed.  If she has progressive or recurrent symptoms or if she has exertional symptoms then I want them to let me know.  She has been up and active multiple times without having any exertional symptoms.  Rationale discussed and agreed.

## 2018-03-28 ENCOUNTER — Ambulatory Visit: Payer: Medicare Other | Admitting: Neurology

## 2018-04-18 ENCOUNTER — Encounter: Payer: Self-pay | Admitting: Neurology

## 2018-04-18 ENCOUNTER — Ambulatory Visit: Payer: Medicare Other | Admitting: Neurology

## 2018-04-18 VITALS — BP 104/70 | HR 71 | Ht 67.0 in | Wt 185.4 lb

## 2018-04-18 DIAGNOSIS — Z7189 Other specified counseling: Secondary | ICD-10-CM

## 2018-04-18 DIAGNOSIS — F0280 Dementia in other diseases classified elsewhere without behavioral disturbance: Secondary | ICD-10-CM

## 2018-04-18 DIAGNOSIS — G7000 Myasthenia gravis without (acute) exacerbation: Secondary | ICD-10-CM | POA: Diagnosis not present

## 2018-04-18 DIAGNOSIS — G309 Alzheimer's disease, unspecified: Secondary | ICD-10-CM | POA: Diagnosis not present

## 2018-04-18 MED ORDER — MEMANTINE HCL 10 MG PO TABS: 10.0000 mg | ORAL_TABLET | Freq: Two times a day (BID) | ORAL | 3 refills | Status: DC

## 2018-04-18 MED ORDER — PYRIDOSTIGMINE BROMIDE 60 MG PO TABS: 60.0000 mg | ORAL_TABLET | Freq: Three times a day (TID) | ORAL | 3 refills | Status: DC

## 2018-04-18 NOTE — Progress Notes (Signed)
Follow-up Visit   Date: 04/18/18   [name] [name] MRN: [id number] DOB: [birth year]   Interim History: [name] [name] is a 82 y.o. right-handed Caucasian female with history of ocular myasthenia gravis (diagnosed 2007, antibody positive), history of left nocardia brain abscess (1983), and GERD returning to the clinic for follow-up of dementia and myasthenia gravis.  The patient was accompanied to the clinic by her daughter who also provides collateral information.    History of present illness: She was seeing Dr. Sandria Manly since 2009 for ocular myasthenia which presented in 2007 with diplopia, left ptosis, and jaw fatigue. Symptoms were being managed with mestinon 120/60/60 for quite some time. Around early 2015, she started having frequent UTIs and was given ciprofloxacin and developed problems with double vision and right ptosis. Her care was transitioned to Dr. Anne Hahn after Dr. Sandria Manly retired and at his last visit in March 2015, prednisone 10mg  was started in addition to mestinon 120mg  TID. Since then, she reports being relatively stable. I attempted to wean her prednisone slowly and she did well at 7.5mg , but became symptomatic at 6mg , so has been on this dose since late 2016.   Family reports that she is having steady decline in her memory over the past few years. She is still able to do her ADLs and IADLs including finances and driving, but does endorse mild difficulty. She seldom drives and restricts herself to local distances only. Hobbies include playing piano and reading. She had neuropsychological evaluation in 2014 which showed mild cognitive impairment.  In 2015, she underwent neuropsychiatric testing which was consistent with mild demential due to Alzheimer's disease. Aricept was discontinued due to behavior changes.  She stopped driving in 4098.   UPDATE 05/04/2017:  She is here for 1 year follow-up visit.  Her myasthenia is extremely well controlled on prednisone 7.5mg  and mestinon  TID without any spells of ptosis or double vision over the past year.   Her daughter has noticed gradual worsening of memory.  She did not know she had an appointment today, despite being reminded several times on her trip here.  Daughter states that she has poor hearing, but will not get it evaluated.  Patient does not feel that her memory is a problem and gets defensive when this is mentioned by her family.  She is less active and does not have many hobbies.  She meets her sister twice per week and they go out, but otherwise does not engage in any hobbies. She stopped driving two years ago.  Her daughter continues to manage her medications.  UPDATE 04/18/2018:  She is here for 1 year follow-up visit.  Myasthenia gravis remains well-controlled on prednisone 7.5mg  and mestinon 60mg  TID.  No breaktthrough symptoms of weakness, double vision, or droopiness.  Family has noticed that her short-term memory is getting worse and she is much more irritable and argumentative at times.  She needs more supervision and often needs prompting for personal hygiene such as brushing her teeth.   She is able to bathe and dress herself.  She does not cook, but is able to use a microwave.  Her husband manages finances and reminds her to take medications, which are prepared by her daughter.  No interval falls or hospitalizations.   Allergies:  Allergies  Allergen Reactions  . Alendronate Sodium     REACTION: upset stomach  . Donepezil Hcl     intolerant  . Sulfa Antibiotics     rash  . Sulfasalazine Hives  rash  . Tetanus Toxoids     rash  . Vesicare [Solifenacin]     dysuria     Review of Systems:  CONSTITUTIONAL: No fevers, chills, night sweats, or weight loss.   EYES: No visual changes or eye pain ENT: No hearing changes.  No history of nose bleeds.   RESPIRATORY: No cough, wheezing and shortness of breath.   CARDIOVASCULAR: Negative for chest pain, and palpitations.   GI: Negative for abdominal  discomfort, blood in stools or black stools.  No recent change in bowel habits.   GU:  No history of incontinence.   MUSCLOSKELETAL: No history of joint pain or swelling.  No myalgias.   SKIN: Negative for lesions, rash, and itching.   ENDOCRINE: Negative for cold or heat intolerance, polydipsia or goiter.   PSYCH:  No depression or anxiety symptoms.   NEURO: As Above.   Vital Signs:  BP 104/70   Pulse 71   Ht 5\' 7"  (1.702 m)   Wt 185 lb 6 oz (84.1 kg)   SpO2 96%   BMI 29.03 kg/m   General Medical Exam:   General:  Well appearing, comfortable  Neck:  No carotid bruits. Respiratory:  Clear to auscultation, good air entry bilaterally.   Cardiac:  Regular rate and rhythm, no murmur.   Ext:  No edema  Neurological Exam: MENTAL STATUS including orientation to month, person, and place.  She does not know the year or day of the week.  She has poor short-term recall.  She can engage easily in a conversation and answers questions appropriately.  She is well dressed and groomed.  Speech is normal.  Montreal Cognitive Assessment  04/18/2018 05/04/2017 05/04/2016 07/14/2014  Visuospatial/ Executive (0/5) 1 4 5 3   Naming (0/3) 3 3 3 3   Attention: Read list of digits (0/2) 1 2 2 2   Attention: Read list of letters (0/1) 1 1 1 1   Attention: Serial 7 subtraction starting at 100 (0/3) 0 1 3 3   Language: Repeat phrase (0/2) 2 1 2 2   Language : Fluency (0/1) 0 1 0 1  Abstraction (0/2) 1 1 1 1   Delayed Recall (0/5) 0 0 0 0  Orientation (0/6) 2 4 5 5   Total 11 18 22 21   Adjusted Score (based on education) - 18 22 21     CRANIAL NERVES:  Pupils equal round and reactive to light.  Normal conjugate, extra-ocular eye movements in all directions of gaze.  No ptosis with sustained up gaze.  Facial muscles are 5/5.  Face is symmetric. Palate elevates symmetrically.  Tongue is midline.  MOTOR:  Motor strength is 5/5 in all extremities.  Normal tone.  REFLEXES:  Reflexes are 2+/4 throughout  SENSORY:   Vibration intact throughout  GAIT:  Narrow based and stable. Unassisted.   Data: Lab Results  Component Value Date   TSH 0.58 05/10/2010   Labs 07/14/2014:  Vitamin E 2.1, B12 333  Neuropsychiatric testing 02/18/2013: neuropsychological testing that showed evidence of minimum cognitive impairment. The patient did have some difficulty remembering names for people. Overall, the cognitive impairment was not severe.  Ne[facility]psychiatric testing at [facility] 07/30/2014:  Mild dementia due to Alzheimer's dementia.  MRI brain 12/25/2012: Scattered white matter changes, mild perisylvian atrophy.   IMPRESSION/PLAN: 1. Seropositive ocular myasthenia gravis without exacerbation (2007) - Exam stable, without evidence of bulbar weakness - Continue prednisone 7.5mg  daily.  She had breakthrough symptoms when prednisone was reduced to 6mg  previously.  - Continue mestinon to 60mg   three daily  2. Alzheimer's dementia, moderate (dx 2015).  There is progression of dementia over the past year and she is requiring greater supervision at home to be sure she is taking medications and tending to her personal care.  Husband is POA. Her namenda will be increased to 10mg  twice daily for memory and also to see if it improves behavioral changes.  She did not tolerate Aricept.  Home safety was discussed and daughter was informed that her care needs may increase in the future.   3.  Advanced care planning.  Today, I also spent 20 minutes in face-to-face counseling re: Advance Care Planning including the discussion of the patient's goals of care and preferences, future treatment options and decision, and an explanation of advanced directives.  In addition to the patient, daughter was present.  Goals of care discussion included maintaining quality of life, PEG/trach, home safety, and symptom management.  Patient and family had the opportunity to ask questions and I answered them to the best of my  ability. A copy of Advanced Directive forms was provided and she was encouraged to discuss her wishes with her husband, who is POA, as well as children.  Return to clinic in 1 year   Thank you for allowing me to participate in patient's care.  If I can answer any additional questions, I would be pleased to do so.    Sincerely,    Donika K. Allena KatzPatel, DO

## 2018-05-06 ENCOUNTER — Ambulatory Visit: Payer: Medicare Other | Admitting: Neurology

## 2018-07-11 ENCOUNTER — Other Ambulatory Visit: Payer: Self-pay | Admitting: Neurology

## 2019-01-15 ENCOUNTER — Telehealth: Payer: Self-pay | Admitting: Family Medicine

## 2019-01-15 NOTE — Telephone Encounter (Signed)
Left message asking [name] (daughter) to call office please change appointment with lisa to lab only appointment what ever time works best for pt.

## 2019-01-16 ENCOUNTER — Other Ambulatory Visit (INDEPENDENT_AMBULATORY_CARE_PROVIDER_SITE_OTHER): Payer: Medicare Other

## 2019-01-16 ENCOUNTER — Other Ambulatory Visit: Payer: Self-pay | Admitting: Family Medicine

## 2019-01-16 ENCOUNTER — Other Ambulatory Visit: Payer: Self-pay

## 2019-01-16 DIAGNOSIS — E781 Pure hyperglyceridemia: Secondary | ICD-10-CM | POA: Diagnosis not present

## 2019-01-16 DIAGNOSIS — M810 Age-related osteoporosis without current pathological fracture: Secondary | ICD-10-CM

## 2019-01-16 DIAGNOSIS — R739 Hyperglycemia, unspecified: Secondary | ICD-10-CM | POA: Diagnosis not present

## 2019-01-16 LAB — COMPREHENSIVE METABOLIC PANEL
ALT: 18 U/L (ref 0–35)
AST: 15 U/L (ref 0–37)
Albumin: 3.7 g/dL (ref 3.5–5.2)
Alkaline Phosphatase: 75 U/L (ref 39–117)
BUN: 19 mg/dL (ref 6–23)
CO2: 26 mEq/L (ref 19–32)
Calcium: 8.8 mg/dL (ref 8.4–10.5)
Chloride: 109 mEq/L (ref 96–112)
Creatinine, Ser: 1.21 mg/dL — ABNORMAL HIGH (ref 0.40–1.20)
GFR: 42.69 mL/min — ABNORMAL LOW (ref >60.00)
Glucose, Bld: 118 mg/dL — ABNORMAL HIGH (ref 70–99)
Potassium: 3.9 mEq/L (ref 3.5–5.1)
Sodium: 142 mEq/L (ref 135–145)
Total Bilirubin: 0.5 mg/dL (ref 0.2–1.2)
Total Protein: 6 g/dL (ref 6.0–8.3)

## 2019-01-16 LAB — LIPID PANEL
Cholesterol: 172 mg/dL (ref 0–200)
HDL: 54.1 mg/dL (ref >39.00)
LDL Cholesterol: 94 mg/dL (ref 0–99)
NonHDL: 118.12
Total CHOL/HDL Ratio: 3
Triglycerides: 120 mg/dL (ref 0.0–149.0)
VLDL: 24 mg/dL (ref 0.0–40.0)

## 2019-01-16 LAB — VITAMIN D 25 HYDROXY (VIT D DEFICIENCY, FRACTURES): VITD: 39.68 ng/mL (ref 30.00–100.00)

## 2019-01-16 LAB — HEMOGLOBIN A1C: Hgb A1c MFr Bld: 6.2 % (ref 4.6–6.5)

## 2019-01-19 ENCOUNTER — Telehealth: Payer: Self-pay | Admitting: Family Medicine

## 2019-01-19 NOTE — Telephone Encounter (Signed)
Notify patient/husband.  Patient has memory loss. Upcoming appointment has been postponed due to coronavirus pandemic.  Sugar is minimally elevated but she is not diabetic.  Kidney and liver tests are fine.  Lipids are reasonable.  Vitamin D is normal.  Thanks.

## 2019-01-20 ENCOUNTER — Encounter: Payer: Self-pay | Admitting: *Deleted

## 2019-01-20 NOTE — Telephone Encounter (Signed)
Husband came in for visit.  Letter given to husband.

## 2019-01-20 NOTE — Telephone Encounter (Signed)
Phoned patient, no answer, no VM.  Will try again later.

## 2019-01-21 ENCOUNTER — Encounter: Payer: Medicare Other | Admitting: Family Medicine

## 2019-03-17 ENCOUNTER — Other Ambulatory Visit: Payer: Self-pay | Admitting: Neurology

## 2019-03-25 ENCOUNTER — Other Ambulatory Visit: Payer: Self-pay

## 2019-03-25 ENCOUNTER — Ambulatory Visit (INDEPENDENT_AMBULATORY_CARE_PROVIDER_SITE_OTHER): Payer: Medicare Other | Admitting: Family Medicine

## 2019-03-25 ENCOUNTER — Encounter: Payer: Medicare Other | Admitting: Family Medicine

## 2019-03-25 ENCOUNTER — Encounter: Payer: Self-pay | Admitting: Family Medicine

## 2019-03-25 VITALS — BP 102/68 | HR 58 | Temp 98.4°F | Ht 67.0 in | Wt 182.3 lb

## 2019-03-25 DIAGNOSIS — Z0000 Encounter for general adult medical examination without abnormal findings: Secondary | ICD-10-CM

## 2019-03-25 DIAGNOSIS — G7000 Myasthenia gravis without (acute) exacerbation: Secondary | ICD-10-CM

## 2019-03-25 DIAGNOSIS — Z7189 Other specified counseling: Secondary | ICD-10-CM

## 2019-03-25 DIAGNOSIS — F0280 Dementia in other diseases classified elsewhere without behavioral disturbance: Secondary | ICD-10-CM

## 2019-03-25 NOTE — Progress Notes (Signed)
I have personally reviewed the Medicare Annual Wellness questionnaire and have noted 1. The patient's medical and social history 2. Their use of alcohol, tobacco or illicit drugs 3. Their current medications and supplements 4. The patient's functional ability including ADL's, fall risks, home safety risks and hearing or visual             impairment. 5. Diet and physical activities 6. Evidence for depression or mood disorders  The patients weight, height, BMI have been recorded in the chart and visual acuity is per eye clinic.  I have made referrals, counseling and provided education to the patient based review of the above and I have provided the pt with a written personalized care plan for preventive services.  Provider list updated- see scanned forms.  Routine anticipatory guidance given to patient.  See health maintenance. The possibility exists that previously documented standard health maintenance information may have been brought forward from a previous encounter into this note.  If needed, that same information has been updated to reflect the current situation based on today's encounter.    Flu 2019 Shingles prev done  PNA UTD.  Tetanus 2004, d/w pt and husband.  It may be cheaper for patient at pharmacy.  Colonoscopy 2011  Breast cancer screening 2018, d/w pt and husband.   DXA deferred for now. She doesn't want further eval or tx.  She didn't tolerate tx prev.  Advance directive- husband or either daughter designated if patient were incapacitated.  Cognitive function addressed- see scanned forms- and if abnormal then additional documentation follows-patient is oriented to self but not the year. She repeats herself frequently during conversation.    Memory loss dw pt and husband.  Progressive, with husband monitoring patient.  No ADE on med.  Compliant.  She has neuro f/u pending.  I asked her husband to update me if there was anything I could do at this point.  MG.  Still on  baseline meds per neurology.  Labs d/w pt. No double vision or weakness. Husband hasn't needed drooping eyelids.  Has f/u pending with neurology.   See after visit summary.  PMH and SH reviewed  Meds, vitals, and allergies reviewed.   ROS: Per HPI.  Unless specifically indicated otherwise in HPI, the patient denies:  General: fever. Eyes: acute vision changes ENT: sore throat Cardiovascular: chest pain Respiratory: SOB GI: vomiting GU: dysuria Musculoskeletal: acute back pain Derm: acute rash Neuro: acute motor dysfunction Psych: worsening mood Endocrine: polydipsia Heme: bleeding Allergy: hayfever  GEN: nad, alert and oriented to self but not to date. HEENT: mucous membranes moist NECK: supple w/o LA CV: rrr. PULM: ctab, no inc wob ABD: soft, +bs EXT: no edema SKIN: no acute rash

## 2019-03-25 NOTE — Patient Instructions (Addendum)
You can call for a mammogram at the [facility] [address] [place] [address] [place]  The tetanus shot and shingrix shot may be cheaper at the pharmacy.   Call about an eye exam when possible.    Don't change your meds for now.  Update me as needed.  Keep the neurology appointment.  Take care.  Glad to see you.

## 2019-03-26 NOTE — Assessment & Plan Note (Signed)
Advance directive- husband or either daughter designated if patient were incapacitated.

## 2019-03-26 NOTE — Assessment & Plan Note (Signed)
  Flu 2019 Shingles prev done  PNA UTD.  Tetanus 2004, d/w pt and husband.  It may be cheaper for patient at pharmacy.  Colonoscopy 2011  Breast cancer screening 2018, d/w pt and husband.   DXA deferred for now. She doesn't want further eval or tx.  She didn't tolerate tx prev.  Advance directive- husband or either daughter designated if patient were incapacitated.  Cognitive function addressed- see scanned forms- and if abnormal then additional documentation follows-patient is oriented to self but not the year. She repeats herself frequently during conversation.

## 2019-03-26 NOTE — Assessment & Plan Note (Signed)
Memory loss dw pt and husband.  Progressive, with husband monitoring patient.  No ADE on med.  Compliant.  She has neuro f/u pending.  I asked her husband to update me if there was anything I could do at this point.  He said he would update me as needed.  I will await update from neurology.

## 2019-03-26 NOTE — Assessment & Plan Note (Signed)
[name] [name].  Still on baseline meds per neurology.  Labs d/w pt. No double vision or weakness. Husband hasn't needed drooping eyelids.  Has f/u pending with neurology.  I will await neurology input.

## 2019-04-21 ENCOUNTER — Ambulatory Visit: Payer: Medicare Other | Admitting: Neurology

## 2019-05-12 ENCOUNTER — Ambulatory Visit: Payer: Medicare Other | Admitting: Neurology

## 2019-05-13 ENCOUNTER — Other Ambulatory Visit: Payer: Self-pay

## 2019-05-13 ENCOUNTER — Encounter: Payer: Self-pay | Admitting: Family Medicine

## 2019-05-13 ENCOUNTER — Ambulatory Visit: Payer: Medicare Other | Admitting: Family Medicine

## 2019-05-13 DIAGNOSIS — F0280 Dementia in other diseases classified elsewhere without behavioral disturbance: Secondary | ICD-10-CM

## 2019-05-13 DIAGNOSIS — H9190 Unspecified hearing loss, unspecified ear: Secondary | ICD-10-CM

## 2019-05-13 DIAGNOSIS — G309 Alzheimer's disease, unspecified: Secondary | ICD-10-CM

## 2019-05-13 NOTE — Progress Notes (Signed)
She is here with family, she has memory loss at baseline.    Family has noted memory changes that are progressive.  She is more resistant to family help recently.  She has been sundowning recently, more argumentative.  She is getting up more at night, with more confusion.  Sleep is disrupted and the next day is clear worse.  She has f/u with neuro pending.   Family noted hearing changes in the last few months, wanted to get her hearing/ears evaluated.  Meds, vitals, and allergies reviewed.   ROS: Per HPI unless specifically indicated in ROS section   nad Pleasant in conversation but hard of hearing and she repeats herself in conversation.  She requires redirection. She doesn't have ETD, she has normal TM movement on valsalva.  No cerumen B.  Weber equal, air>bone conduction.

## 2019-05-13 NOTE — Patient Instructions (Signed)
I don't see any wax in your ears.  Take care.  Glad to see you.  I'll update Dr. Posey Pronto.

## 2019-05-14 DIAGNOSIS — H9190 Unspecified hearing loss, unspecified ear: Secondary | ICD-10-CM | POA: Insufficient documentation

## 2019-05-14 NOTE — Assessment & Plan Note (Signed)
She is more resistant to family help recently.  She has been sundowning recently, more argumentative.  She is getting up more at night, with more confusion.  I will ask for neuro input.

## 2019-05-14 NOTE — Assessment & Plan Note (Signed)
Likely progressive bilateral age-related hearing loss but it may be that hearing aids would be more of a hassle for the patient.  Family was worried that the patient would lose the hearing aids, get agitated with them, etc.  I will defer at this point.  She does not have eustachian tube dysfunction and she did not have wax impaction.

## 2019-05-19 ENCOUNTER — Telehealth: Payer: Self-pay | Admitting: Family Medicine

## 2019-05-19 MED ORDER — MIRTAZAPINE 7.5 MG PO TABS: 7.5000 mg | ORAL_TABLET | Freq: Every day | ORAL | 1 refills | Status: DC

## 2019-05-19 NOTE — Telephone Encounter (Signed)
Notify pt's daughter.  I updated neuro and sent in mirtazapine to take at night to see if that helps.  I thank all involved. Update me/neuro as needed.

## 2019-05-19 NOTE — Telephone Encounter (Signed)
Daughter advised.

## 2019-05-20 ENCOUNTER — Encounter: Payer: Self-pay | Admitting: Neurology

## 2019-05-21 ENCOUNTER — Other Ambulatory Visit: Payer: Self-pay

## 2019-05-21 ENCOUNTER — Telehealth (INDEPENDENT_AMBULATORY_CARE_PROVIDER_SITE_OTHER): Payer: Medicare Other | Admitting: Neurology

## 2019-05-21 DIAGNOSIS — F0281 Dementia in other diseases classified elsewhere with behavioral disturbance: Secondary | ICD-10-CM | POA: Diagnosis not present

## 2019-05-21 DIAGNOSIS — G309 Alzheimer's disease, unspecified: Secondary | ICD-10-CM

## 2019-05-21 DIAGNOSIS — G7000 Myasthenia gravis without (acute) exacerbation: Secondary | ICD-10-CM | POA: Diagnosis not present

## 2019-05-21 MED ORDER — PYRIDOSTIGMINE BROMIDE 60 MG PO TABS: 60.0000 mg | ORAL_TABLET | Freq: Three times a day (TID) | ORAL | 3 refills | Status: DC

## 2019-05-21 MED ORDER — PREDNISONE 5 MG PO TABS: ORAL_TABLET | ORAL | 3 refills | Status: DC

## 2019-05-21 NOTE — Progress Notes (Signed)
Virtual Visit via Video Note The purpose of this virtual visit is to provide medical care while limiting exposure to the novel coronavirus.    Consent was obtained for video visit:  Yes.   Answered questions that patient had about telehealth interaction:  Yes.   I discussed the limitations, risks, security and privacy concerns of performing an evaluation and management service by telemedicine. I also discussed with the patient that there may be a patient responsible charge related to this service. The patient expressed understanding and agreed to proceed.  Pt location: Home Physician Location: office Name of referring provider:  Joaquim Namuncan, Graham S, MD I connected with Jefm BryantHilda A Ligas at patients initiation/request on 05/21/2019 at 10:50 AM EDT by video enabled telemedicine application and verified that I am speaking with the correct person using two identifiers. Pt MRN:  [id number] Pt DOB:  [birth year] Video Participants:  Jefm BryantHilda A Keeny; daughters Synetta Fail(Anita, [place]am[place])   History of Present Illness: This is a 83 y.o. female returning for follow-up of dementia and myasthenia gravis.  Her daughters have noticed ongoing worsening of memory and executive functioning over the past year. She does not always recognize her daughters and often misnames them as her sisters. She is forgetting that she is in her own home.  Mood is good, however they have noticed increased agitation she is very argumentative at times.  She is also not sleeping well.  The symptoms were mentioned to her PCP last week and she was started on mirtazapine 7.5mg  at bedtime.  They started this 2 nights ago and reported improved sleep, no significant change to behavior yet.  Daughter prepares medications and husband administers it.  Patient forgets to shower and if she is taking a bath, she may forget to use soap. She is able to dress and feed herself.  Daughters feel that caregiver is needed, but patient's husband does not wish to have anyone in  the home.    Myasthenia is very well controlled, no double vision or droopiness of the eyes.  She remains on prednisone 7.5mg  and mestinon 60mg  TID.  Observations/Objective:   Patient is awake, alert, and appears comfortable.  Oriented to self only. She follows 2 step commands quickly and correctly.   Extraocular muscles are intact. No ptosis.  Face is symmetric.  Speech is not dysarthric. Tongue is midline. Antigravity in all extremities.  No pronator drift. Gait appears normal    Assessment and Plan:  1. Alzheimer's dementia, progressing now with behavior changes.  She is requiring greater assistance with ADLs and all IADLs and I agree with her daughters that in-home caregiver is needed for patient safety, care, and companionship.   She will be continued on Namenda 10mg  BID.  I offered trying exelon, but given overall low benefit and that she became worse on donepezil, family does not want to make any medications changes.  For behavior, continue mirtazepine 7.5mg  which seems to be helping with sleep, benefit for behavior can take longer to appreciate and as long as she is tolerating this, it can be titrated in the future, if needed  2. Ocular myasthenia gravis, very well-controlled on prednisone 7.5mg  daily and mestinon 60mg  TID.  She had exacerbation of symptoms when it was tapered to 6mg  daily, so we have decided to keep her at her current dose.   Follow Up Instructions:   I discussed the assessment and treatment plan with the patient. The patient was provided an opportunity to ask questions and all were answered. The  patient agreed with the plan and demonstrated an understanding of the instructions.   The patient was advised to call back or seek an in-person evaluation if the symptoms worsen or if the condition fails to improve as anticipated.  Follow-up in 1 year or sooner as needed  Total time spent:  30 minutes     Alda Berthold, DO

## 2019-05-27 NOTE — Telephone Encounter (Signed)
Patient having increased episodes of agitation.  She was recently started on mirtazapine last week, and I explained that it will take longer to see behavior benefit. For acute agitation and sundowning, she may benefit from low dose seroquel 12.5mg  at bedtime.  She will need updated EKG to check baseline QTc.  I will contact Dr. Josefine Class office to see if they can arrange for her to come next week to have this done.    [name] [name] K. Posey Pronto, DO

## 2019-05-29 ENCOUNTER — Telehealth: Payer: Self-pay | Admitting: Family Medicine

## 2019-05-29 ENCOUNTER — Ambulatory Visit: Payer: Medicare Other | Admitting: Family Medicine

## 2019-05-29 NOTE — Telephone Encounter (Signed)
See my chart message.  Patient needs EKG.  Please see about getting her on the schedule at 230 or 3 today at an office visit.

## 2019-05-29 NOTE — Telephone Encounter (Signed)
Schedule pt for 06/03/19 @ 12:30pm since [place] the beach this [place]. Called daughter and left her voicemail regarding this appointment.

## 2019-05-29 NOTE — Telephone Encounter (Signed)
Noted. Thanks. Routed to Dr. Posey Pronto as Juluis Rainier.

## 2019-06-03 ENCOUNTER — Other Ambulatory Visit: Payer: Self-pay

## 2019-06-03 ENCOUNTER — Ambulatory Visit (INDEPENDENT_AMBULATORY_CARE_PROVIDER_SITE_OTHER): Payer: Medicare Other | Admitting: Family Medicine

## 2019-06-03 DIAGNOSIS — Z79899 Other long term (current) drug therapy: Secondary | ICD-10-CM

## 2019-06-03 DIAGNOSIS — F0281 Dementia in other diseases classified elsewhere with behavioral disturbance: Secondary | ICD-10-CM

## 2019-06-03 DIAGNOSIS — G309 Alzheimer's disease, unspecified: Secondary | ICD-10-CM

## 2019-06-03 NOTE — Progress Notes (Signed)
EKG only Dr Damita Dunnings reviewed EKG and spoke with patient.   [name] [name], [place] 06/03/19  Patient has dementia and needed EKG for consideration of medication change per neurology.  She came in for EKG.  QTc reviewed.  No acute changes on EKG.  Will defer to neurology regarding medication changes otherwise.  I appreciate the help of all involved.  Plan discussed with patient and daughter.

## 2019-06-03 NOTE — Patient Instructions (Signed)
Your EKG looks fine.  I'll update neurology.  Take care.  Glad to see you.

## 2019-06-04 ENCOUNTER — Telehealth: Payer: Medicare Other | Admitting: Neurology

## 2019-06-04 ENCOUNTER — Other Ambulatory Visit: Payer: Self-pay

## 2019-06-04 ENCOUNTER — Telehealth: Payer: Self-pay

## 2019-06-04 MED ORDER — QUETIAPINE FUMARATE 25 MG PO TABS: 12.5000 mg | ORAL_TABLET | Freq: Every day | ORAL | 1 refills | Status: DC

## 2019-06-04 NOTE — Telephone Encounter (Signed)
Contacted Patients daughter and family would like to start medication, I sent rx to pharmacy.

## 2019-06-04 NOTE — Assessment & Plan Note (Signed)
Patient has dementia and needed EKG for consideration of medication change per neurology.  She came in for EKG.  QTc reviewed.  No acute changes on EKG.  Will defer to neurology regarding medication changes otherwise.  I appreciate the help of all involved.  Plan discussed with patient and daughter.

## 2019-06-04 NOTE — Telephone Encounter (Signed)
-----   Message from Cameron Sprang, MD sent at 06/03/2019  1:22 PM EDT ----- Regarding: FW: EKG is in epic [facility],  Can you pls call family and let them know Dr. Posey Pronto is out until Friday, but she had mentioned on her notes that if EKG is fine, she would start low dose Seroquel 25mg : Take 1/2 tablet every night. Main side effect would be drowsiness. Pls send in Rx if family is in agreement, thanks  [place] ----- Message ----- From: Tonia Ghent, MD Sent: 06/03/2019   1:12 PM EDT To: Alda Berthold, DO, Cameron Sprang, MD Subject: EKG is in epic                                 EKG is in epic, just did it at clinic.   QT 336, QTc 402.  I didn't change her meds, I'll defer to you.  Her family would appreciate you changing her meds as soon as possible.  Many thanks. Take care.   Brigitte Pulse

## 2019-06-05 ENCOUNTER — Other Ambulatory Visit: Payer: Self-pay | Admitting: Family Medicine

## 2019-06-05 DIAGNOSIS — Z1231 Encounter for screening mammogram for malignant neoplasm of breast: Secondary | ICD-10-CM

## 2019-06-17 ENCOUNTER — Ambulatory Visit
Admission: RE | Admit: 2019-06-17 | Discharge: 2019-06-17 | Disposition: A | Discharge status: Home / Self Care | Payer: Medicare Other | Source: Ambulatory Visit | Attending: Family Medicine | Admitting: Family Medicine

## 2019-06-17 DIAGNOSIS — Z1231 Encounter for screening mammogram for malignant neoplasm of breast: Secondary | ICD-10-CM | POA: Diagnosis not present

## 2019-06-19 ENCOUNTER — Telehealth: Payer: Self-pay

## 2019-06-19 NOTE — Telephone Encounter (Signed)
Left message for patient to call back about results. 

## 2019-06-20 ENCOUNTER — Encounter: Payer: Self-pay | Admitting: *Deleted

## 2019-06-23 NOTE — Telephone Encounter (Signed)
Anastasyia called to report a normal mammogram.  Tammy advised.

## 2019-06-23 NOTE — Telephone Encounter (Signed)
tammy (daughter) returned your call [phone]

## 2019-07-09 ENCOUNTER — Other Ambulatory Visit: Payer: Self-pay | Admitting: Family Medicine

## 2019-07-11 NOTE — Telephone Encounter (Signed)
Sent. Thanks.   

## 2019-07-11 NOTE — Telephone Encounter (Signed)
Electronic refill request. Mirtazapine Last office visit:   06/03/2019 Last Filled:    30 tablet 1 05/19/2019  Please advise.

## 2019-09-11 ENCOUNTER — Telehealth: Payer: Self-pay | Admitting: Family Medicine

## 2019-09-11 NOTE — Telephone Encounter (Signed)
Patient's husband, Jeneen Rinks, called to find out when patient had a tetanus,shingles and flu shot.

## 2019-09-11 NOTE — Telephone Encounter (Signed)
Husband advised. 

## 2019-09-12 ENCOUNTER — Telehealth: Payer: Self-pay | Admitting: Family Medicine

## 2019-09-12 NOTE — Telephone Encounter (Signed)
Patient's Husband, [place] Gustafson called today.  He stated he is schedule on 11/25 to come in to have his tetanus shot and b12.  HE wanted to know if he wife could get her Tetanus shot also .  Is she okay to get this?

## 2019-09-14 ENCOUNTER — Encounter: Payer: Self-pay | Admitting: Family Medicine

## 2019-09-14 NOTE — Telephone Encounter (Signed)
It looks like she is due for a flu shot, if she has not already gotten one this fall.  I am okay with her getting a tetanus shot but they should check with insurance about coverage to make sure it is not dramatically cheaper at the pharmacy.  Thanks.

## 2019-09-15 NOTE — Telephone Encounter (Signed)
Husband advised. 

## 2019-09-22 ENCOUNTER — Other Ambulatory Visit: Payer: Self-pay | Admitting: Neurology

## 2019-12-29 ENCOUNTER — Other Ambulatory Visit: Payer: Self-pay | Admitting: Neurology

## 2019-12-29 ENCOUNTER — Other Ambulatory Visit: Payer: Self-pay | Admitting: Family Medicine

## 2019-12-29 NOTE — Telephone Encounter (Signed)
Electronic refill request. Mirtazapine Last office visit:   06/03/2019 Last Filled:    90 tablet 1 07/11/2019   Please advise.

## 2019-12-30 NOTE — Telephone Encounter (Signed)
Sent. Thanks.   

## 2020-02-23 ENCOUNTER — Other Ambulatory Visit: Payer: Self-pay | Admitting: Neurology

## 2020-03-04 ENCOUNTER — Encounter: Payer: Medicare PPO | Admitting: Family Medicine

## 2020-03-18 ENCOUNTER — Encounter: Payer: Medicare PPO | Admitting: Family Medicine

## 2020-03-22 ENCOUNTER — Encounter: Payer: Medicare PPO | Admitting: Family Medicine

## 2020-04-02 ENCOUNTER — Other Ambulatory Visit: Payer: Self-pay | Admitting: Neurology

## 2020-04-05 ENCOUNTER — Encounter: Payer: Medicare PPO | Admitting: Family Medicine

## 2020-04-18 ENCOUNTER — Other Ambulatory Visit: Payer: Self-pay | Admitting: Family Medicine

## 2020-04-18 DIAGNOSIS — G309 Alzheimer's disease, unspecified: Secondary | ICD-10-CM

## 2020-04-18 DIAGNOSIS — F0280 Dementia in other diseases classified elsewhere without behavioral disturbance: Secondary | ICD-10-CM

## 2020-04-18 NOTE — Progress Notes (Signed)
This patient's husband was asking if she could get placed temporarily for rehab, if he has surgery.   I told him I would check on that.  The initial messages were in his chart but the referral needs to be placed in her chart.  I have done that now.  I appreciate the help of all involved.

## 2020-04-19 ENCOUNTER — Other Ambulatory Visit: Payer: Self-pay

## 2020-04-19 NOTE — Patient Outreach (Signed)
[facility]) Care Management  04/19/2020  [name] [name] 03/23/1936 [id number]   Referral Date: 04/19/20 Referral Source: Referral Reason: Possible help with placement   Outreach Attempt: Spoke with spouse [name]. He states that he is going to be needing surgery on his shoulder and was looking for temporary placement for his wife but he states that surgery has not been scheduled and he is told that it may be a couple of months before it is scheduled.  He also states that surgery is only overnight and that he is going to look to family to help out for about one week when that time comes.  He states he has [facility] in [place] in mind and has inquired about them but he wants to wait for now.  Advised him that CM could send letter and brochure so if he needs assistance when the time comes he can call CM. He is agreeable and appreciates the assistance.    Plan: RN CM will send letter and close case at this time.     [name], RN, MSN [facility] Care Management Care Management Coordinator Direct Line [phone] Toll Free: [phone]  Fax: [phone]

## 2020-05-05 ENCOUNTER — Telehealth: Payer: Self-pay | Admitting: Family Medicine

## 2020-05-05 DIAGNOSIS — Z111 Encounter for screening for respiratory tuberculosis: Secondary | ICD-10-CM

## 2020-05-05 NOTE — Telephone Encounter (Signed)
I put in the order.  If the patient is going to need any additional paperwork then please let me know.  Please schedule a lab appointment when possible.  Thanks. 

## 2020-05-05 NOTE — Telephone Encounter (Signed)
Pt is requesting TB test for nursing home. Is it ok to schedule? Need as soon as possible. 

## 2020-05-06 ENCOUNTER — Other Ambulatory Visit: Payer: Self-pay

## 2020-05-06 ENCOUNTER — Other Ambulatory Visit (INDEPENDENT_AMBULATORY_CARE_PROVIDER_SITE_OTHER): Payer: Medicare PPO

## 2020-05-06 DIAGNOSIS — Z111 Encounter for screening for respiratory tuberculosis: Secondary | ICD-10-CM

## 2020-05-06 NOTE — Telephone Encounter (Signed)
Discussed with pt's husband and he said his wife will need to go into a SNF and they require this test. He is also getting this test. Both scheduled for lab apts today.

## 2020-05-08 LAB — QUANTIFERON-TB GOLD PLUS
Mitogen-NIL: >10 IU/mL
NIL: 0.04 IU/mL
QuantiFERON-TB Gold Plus: NEGATIVE
TB1-NIL: 0.01 IU/mL
TB2-NIL: 0 IU/mL

## 2020-05-10 ENCOUNTER — Telehealth: Payer: Self-pay | Admitting: Family Medicine

## 2020-05-10 NOTE — Telephone Encounter (Signed)
Patient's husband called in stating he would like to have a copy of TB test mailed to the house. Please advise, as he would not like it on mychart.

## 2020-05-11 ENCOUNTER — Encounter: Payer: Self-pay | Admitting: Neurology

## 2020-05-11 NOTE — Telephone Encounter (Signed)
Printed and mailed

## 2020-05-11 NOTE — Progress Notes (Addendum)
Joeleen Hodsdon Key: [id number] - PA Case ID: [id number] - Rx #: [id number] Need help? Call us at [phone] Outcome Approvedtoday PA Case: [id number], Status: Approved, Coverage Starts on: 10/31/2019 12:00:00 AM, Coverage Ends on: 10/29/2020 12:00:00 AM. Questions? Contact [phone]. Drug QUEtiapine Fumarate 25MG  tablets Form Electronic PA Form Original Claim Info [id number] Provide Notice: Medicare Prescription Drug Coverage and Your Rights

## 2020-05-20 ENCOUNTER — Telehealth: Payer: Self-pay | Admitting: Family Medicine

## 2020-05-20 NOTE — Telephone Encounter (Signed)
Lisa Johnson from [facility] called stating she needs a copy of patients last 2 office visit and a request for FL2 form faxed to [phone]. 

## 2020-05-20 NOTE — Telephone Encounter (Signed)
Requested info faxed to Lisa Johnson at [facility], [place]. 

## 2020-05-21 ENCOUNTER — Ambulatory Visit: Payer: Medicare Other | Admitting: Neurology

## 2020-05-24 ENCOUNTER — Telehealth: Payer: Self-pay | Admitting: Family Medicine

## 2020-05-24 NOTE — Telephone Encounter (Signed)
spouse called in stating he is needing to have an FL2 form filled out for today or tomorrow. Mr Kem states he would like a call when done. Did advise spouse of standard turn around time and that the form is needing to be provided. Mr Dejoy stated he was told that we need to provide the form. info.   Mr Schimpf called back checking on form.  He was going to make an appointment fist available was Thursday.  He stated he was going to be in hospital and needs this form filled out so his spouse could be in nursing for about a week  ([facility])  While he is in hospital   He needs this ASAP Please call [phone]  [facility] is going to fax FL2 form

## 2020-05-24 NOTE — Telephone Encounter (Signed)
Husband advised that form is ready for pickup.  Form left at front desk.

## 2020-05-24 NOTE — Telephone Encounter (Signed)
Can you send the prev FL2 that I did ahead of time?

## 2020-05-26 ENCOUNTER — Encounter: Payer: Self-pay | Admitting: Family Medicine

## 2020-05-27 NOTE — Telephone Encounter (Signed)
I called and discussed with [facility]. I faxed directly to her and she confirmed that it came through correctly. I also placed a copy at the front for Tammy to pick up.

## 2020-05-27 NOTE — Telephone Encounter (Signed)
Lisa from [facility] called.  She received faxed documentation this morning, but she didn't receive all the documentation.  She's asking for documentation to be faxed to a different fax number. Fax number [phone]. She's asking for it ASAP.  Patient is moving in today.

## 2020-05-27 NOTE — Telephone Encounter (Signed)
Please send over the information that I did this AM and verify that they have it.  I had filled out as much of the FL2 ahead of time as I could.  I think this is the 3rd time I've filled out a FL2 for this patient for this admission.

## 2020-05-27 NOTE — Telephone Encounter (Signed)
[facility] Night - Client Nonclinical Telephone Record AccessNurse Client [facility] Night - Client Client Site [facility] - Night Physician Duncan, Shaw - MD Contact Type Call Who Is Calling Patient / Member / Family / Caregiver Caller Name Tammy Norman Caller Phone Number [phone] Patient Name [name] [name] Patient DOB 08/31/1936 Call Type Message Only Information Provided Reason for Call Request for General Office Information Initial Comment Caller states her mother was to get paper work with her medication list. It needs to be sent ASAP she cannot move in until they have received. Disp. Time Disposition Final User 05/26/2020 5:15:30 PM General Information Provided Yes Sanchez, Lynda Call Closed By: Lynda Sanchez Transaction Date/Time: 05/26/2020 5:10:38 PM (ET) 

## 2020-05-27 NOTE — Telephone Encounter (Signed)
Daughter called in stating she is needing to have the FL2 forms sent in today. Notified daughter they had been sent over this morning and to contact facility to see what pages are missing. Daughter believes it is 4 and 5. Daughter states she would like to come pick them up if possible and to try faxing it [phone] or [phone] to [facility]. Please advise. Call number for [name] is [phone]. Daughter [name] would like a call when done. [phone].

## 2020-05-27 NOTE — Telephone Encounter (Signed)
[facility] Night - Client Nonclinical Telephone Record AccessNurse Client [facility] Night - Client Client Site [facility] - Night Physician Raechel Ache - MD Contact Type Call Who Is Calling Patient / Member / Family / Caregiver Caller Name [name] Phone Number [phone] Patient Name [name] [name] Patient DOB Mar 25, 1936 Call Type Message Only Information Provided Reason for Call Request for General Office Information Initial Comment Caller states her mother was to get paper work with her medication list. It needs to be sent ASAP she cannot move in until they have received. Disp. Time Disposition Final User 05/26/2020 5:15:30 PM General Information Provided Yes [name] Call Closed By: [name] Transaction Date/Time: 05/26/2020 5:10:38 PM (ET)

## 2020-05-27 NOTE — Telephone Encounter (Signed)
See my chart message

## 2020-05-28 ENCOUNTER — Ambulatory Visit: Payer: Medicare Other | Admitting: Neurology

## 2020-05-28 NOTE — Telephone Encounter (Signed)
This was faxed successfully on May 27, 2020.

## 2020-07-02 ENCOUNTER — Encounter: Payer: Self-pay | Admitting: Family Medicine

## 2020-07-05 ENCOUNTER — Other Ambulatory Visit: Payer: Self-pay | Admitting: Family Medicine

## 2020-07-05 MED ORDER — ACETAMINOPHEN 500 MG PO TABS
500.0000 mg | ORAL_TABLET | Freq: Three times a day (TID) | ORAL | Status: DC | PRN
Start: 2020-07-05 — End: 2020-11-12

## 2020-07-05 MED ORDER — HYDROXYZINE HCL 10 MG PO TABS
5.0000 mg | ORAL_TABLET | Freq: Three times a day (TID) | ORAL | 0 refills | Status: DC | PRN
Start: 2020-07-05 — End: 2020-11-12

## 2020-07-14 ENCOUNTER — Other Ambulatory Visit: Payer: Self-pay | Admitting: Neurology

## 2020-07-14 ENCOUNTER — Other Ambulatory Visit: Payer: Self-pay | Admitting: Family Medicine

## 2020-07-14 NOTE — Telephone Encounter (Signed)
Refill request Remeron Last office visit 06/03/19 Last refill 12/30/19 #90/1

## 2020-07-14 NOTE — Telephone Encounter (Signed)
Sent. Thanks.   

## 2020-07-23 ENCOUNTER — Telehealth (INDEPENDENT_AMBULATORY_CARE_PROVIDER_SITE_OTHER): Payer: Medicare PPO | Admitting: Neurology

## 2020-07-23 ENCOUNTER — Other Ambulatory Visit: Payer: Self-pay

## 2020-07-23 ENCOUNTER — Encounter: Payer: Self-pay | Admitting: Neurology

## 2020-07-23 VITALS — Wt 180.0 lb

## 2020-07-23 DIAGNOSIS — G7000 Myasthenia gravis without (acute) exacerbation: Secondary | ICD-10-CM

## 2020-07-23 DIAGNOSIS — F0281 Dementia in other diseases classified elsewhere with behavioral disturbance: Secondary | ICD-10-CM

## 2020-07-23 DIAGNOSIS — G309 Alzheimer's disease, unspecified: Secondary | ICD-10-CM | POA: Diagnosis not present

## 2020-07-23 MED ORDER — PYRIDOSTIGMINE BROMIDE 60 MG PO TABS: 60.0000 mg | ORAL_TABLET | Freq: Three times a day (TID) | ORAL | 3 refills | Status: AC

## 2020-07-23 MED ORDER — PREDNISONE 5 MG PO TABS: 7.5000 mg | ORAL_TABLET | Freq: Every day | ORAL | 3 refills | Status: AC

## 2020-07-23 NOTE — Progress Notes (Signed)
   Virtual Visit via Video Note The purpose of this virtual visit is to provide medical care while limiting exposure to the novel coronavirus.    Consent was obtained for video visit:  Yes.   Answered questions that patient had about telehealth interaction:  Yes.   I discussed the limitations, risks, security and privacy concerns of performing an evaluation and management service by telemedicine. I also discussed with the patient that there may be a patient responsible charge related to this service. The patient expressed understanding and agreed to proceed.  Pt location: Home Physician Location: office Name of referring provider:  Joaquim Nam, MD I connected with [name] [name] at patients initiation/request on 07/23/2020 at  9:30 AM EDT by video enabled telemedicine application and verified that I am speaking with the correct person using two identifiers. Pt MRN:  [id number] Pt DOB:  [birth year] Video Participants:  [name] [name];  Daughters (Tammy and Synetta Fail)   History of Present Illness: This is a 84 y.o. female returning for follow-up of Alzhemier's dementia and ocular myasthenia gravis.  Over the past year, there has been steady progression of dementia associated with behavior changes.  She needs assistance with basic ADLs such as bathing and continence.  She needs to be reminded of brushing teeth, washing hands, etc. She is ale to dress and feed herself.  She often forgets who her daughters are and calls them her sisters.  She is currently living at [facility] because her husband required surgery and was unable to care for her at home until he had recovered.  She has a lot of social engagement and seems to be doing better.  They are planning on having her return home next week and have caregiver come twice per week. Because of agitation/anxiety, she was started on hydroxyzine prn.  She also takes mirtazepine 7.5mg  and seroquel 12.5mg  at bedtime.   Myasthenia is well-controlled  on prednisone 7.5mg  and mestinon 60mg  three times daily.     Assessment and Plan:  1.  Alzheimer's dementia with behavior changes, worsening as expected with disease course  - Discussed home safety is paramount when she returns home  - Family has arranged for caregiver to come 2 days per week and her husband will also be there  - Continue Namenda 10mg  BID  - Continue mirtazepine 7.5mg  daily, OK to increase to 15mg  at bedtime if behavior worsens  - Continue seroquel 12.5mg  at bedtime as needed  2.  Ocular myasthenia gravis without exacerbation  - Continue prednisone 7.5mg  daily - she had breakthrough weakness when tapered to 6mg /d  - Continue mestinon 60mg  TID   Follow Up Instructions:   I discussed the assessment and treatment plan with the patient. The patient was provided an opportunity to ask questions and all were answered. The patient agreed with the plan and demonstrated an understanding of the instructions.   The patient was advised to call back or seek an in-person evaluation if the symptoms worsen or if the condition fails to improve as anticipated.  Follow-up in 1 year   , DO

## 2020-09-01 ENCOUNTER — Other Ambulatory Visit: Payer: Self-pay | Admitting: Neurology

## 2020-10-19 DIAGNOSIS — C44622 Squamous cell carcinoma of skin of right upper limb, including shoulder: Secondary | ICD-10-CM | POA: Diagnosis not present

## 2020-10-27 ENCOUNTER — Telehealth: Payer: Self-pay

## 2020-10-27 ENCOUNTER — Emergency Department (HOSPITAL_COMMUNITY): Payer: Medicare PPO

## 2020-10-27 ENCOUNTER — Inpatient Hospital Stay (HOSPITAL_COMMUNITY): Payer: Medicare PPO

## 2020-10-27 ENCOUNTER — Encounter (HOSPITAL_COMMUNITY): Payer: Self-pay | Admitting: Internal Medicine

## 2020-10-27 ENCOUNTER — Other Ambulatory Visit: Payer: Self-pay

## 2020-10-27 ENCOUNTER — Inpatient Hospital Stay (HOSPITAL_COMMUNITY)
Admission: EM | Admit: 2020-10-27 | Discharge: 2020-11-12 | DRG: 871 | Disposition: A | Discharge status: Skilled Nursing Facility | Payer: Medicare PPO | Attending: Internal Medicine | Admitting: Internal Medicine

## 2020-10-27 DIAGNOSIS — A4189 Other specified sepsis: Principal | ICD-10-CM | POA: Diagnosis present

## 2020-10-27 DIAGNOSIS — J9811 Atelectasis: Secondary | ICD-10-CM | POA: Diagnosis not present

## 2020-10-27 DIAGNOSIS — H9190 Unspecified hearing loss, unspecified ear: Secondary | ICD-10-CM | POA: Diagnosis present

## 2020-10-27 DIAGNOSIS — M2550 Pain in unspecified joint: Secondary | ICD-10-CM | POA: Diagnosis not present

## 2020-10-27 DIAGNOSIS — F0281 Dementia in other diseases classified elsewhere with behavioral disturbance: Secondary | ICD-10-CM

## 2020-10-27 DIAGNOSIS — F419 Anxiety disorder, unspecified: Secondary | ICD-10-CM | POA: Diagnosis present

## 2020-10-27 DIAGNOSIS — G319 Degenerative disease of nervous system, unspecified: Secondary | ICD-10-CM | POA: Diagnosis not present

## 2020-10-27 DIAGNOSIS — G929 Unspecified toxic encephalopathy: Secondary | ICD-10-CM | POA: Diagnosis present

## 2020-10-27 DIAGNOSIS — J1282 Pneumonia due to coronavirus disease 2019: Secondary | ICD-10-CM

## 2020-10-27 DIAGNOSIS — R29818 Other symptoms and signs involving the nervous system: Secondary | ICD-10-CM | POA: Diagnosis not present

## 2020-10-27 DIAGNOSIS — G7000 Myasthenia gravis without (acute) exacerbation: Secondary | ICD-10-CM | POA: Diagnosis not present

## 2020-10-27 DIAGNOSIS — E162 Hypoglycemia, unspecified: Secondary | ICD-10-CM | POA: Diagnosis not present

## 2020-10-27 DIAGNOSIS — J189 Pneumonia, unspecified organism: Secondary | ICD-10-CM | POA: Diagnosis not present

## 2020-10-27 DIAGNOSIS — J690 Pneumonitis due to inhalation of food and vomit: Secondary | ICD-10-CM | POA: Diagnosis not present

## 2020-10-27 DIAGNOSIS — I2699 Other pulmonary embolism without acute cor pulmonale: Secondary | ICD-10-CM | POA: Diagnosis not present

## 2020-10-27 DIAGNOSIS — J159 Unspecified bacterial pneumonia: Secondary | ICD-10-CM | POA: Diagnosis present

## 2020-10-27 DIAGNOSIS — R4182 Altered mental status, unspecified: Secondary | ICD-10-CM

## 2020-10-27 DIAGNOSIS — Z8249 Family history of ischemic heart disease and other diseases of the circulatory system: Secondary | ICD-10-CM | POA: Diagnosis not present

## 2020-10-27 DIAGNOSIS — Z803 Family history of malignant neoplasm of breast: Secondary | ICD-10-CM | POA: Diagnosis not present

## 2020-10-27 DIAGNOSIS — E559 Vitamin D deficiency, unspecified: Secondary | ICD-10-CM | POA: Diagnosis not present

## 2020-10-27 DIAGNOSIS — R0902 Hypoxemia: Secondary | ICD-10-CM | POA: Diagnosis not present

## 2020-10-27 DIAGNOSIS — R4701 Aphasia: Secondary | ICD-10-CM | POA: Diagnosis not present

## 2020-10-27 DIAGNOSIS — M797 Fibromyalgia: Secondary | ICD-10-CM | POA: Diagnosis not present

## 2020-10-27 DIAGNOSIS — U071 COVID-19: Secondary | ICD-10-CM | POA: Diagnosis not present

## 2020-10-27 DIAGNOSIS — F0280 Dementia in other diseases classified elsewhere without behavioral disturbance: Secondary | ICD-10-CM | POA: Diagnosis present

## 2020-10-27 DIAGNOSIS — K219 Gastro-esophageal reflux disease without esophagitis: Secondary | ICD-10-CM | POA: Diagnosis present

## 2020-10-27 DIAGNOSIS — F05 Delirium due to known physiological condition: Secondary | ICD-10-CM | POA: Diagnosis not present

## 2020-10-27 DIAGNOSIS — G9341 Metabolic encephalopathy: Secondary | ICD-10-CM | POA: Diagnosis not present

## 2020-10-27 DIAGNOSIS — Z66 Do not resuscitate: Secondary | ICD-10-CM | POA: Diagnosis not present

## 2020-10-27 DIAGNOSIS — I4510 Unspecified right bundle-branch block: Secondary | ICD-10-CM | POA: Diagnosis present

## 2020-10-27 DIAGNOSIS — F339 Major depressive disorder, recurrent, unspecified: Secondary | ICD-10-CM | POA: Diagnosis not present

## 2020-10-27 DIAGNOSIS — R6520 Severe sepsis without septic shock: Secondary | ICD-10-CM | POA: Diagnosis present

## 2020-10-27 DIAGNOSIS — J9690 Respiratory failure, unspecified, unspecified whether with hypoxia or hypercapnia: Secondary | ICD-10-CM | POA: Diagnosis not present

## 2020-10-27 DIAGNOSIS — Z888 Allergy status to other drugs, medicaments and biological substances status: Secondary | ICD-10-CM

## 2020-10-27 DIAGNOSIS — K449 Diaphragmatic hernia without obstruction or gangrene: Secondary | ICD-10-CM | POA: Diagnosis not present

## 2020-10-27 DIAGNOSIS — J9600 Acute respiratory failure, unspecified whether with hypoxia or hypercapnia: Secondary | ICD-10-CM | POA: Diagnosis not present

## 2020-10-27 DIAGNOSIS — Z7952 Long term (current) use of systemic steroids: Secondary | ICD-10-CM

## 2020-10-27 DIAGNOSIS — Z8669 Personal history of other diseases of the nervous system and sense organs: Secondary | ICD-10-CM | POA: Diagnosis not present

## 2020-10-27 DIAGNOSIS — R609 Edema, unspecified: Secondary | ICD-10-CM | POA: Diagnosis not present

## 2020-10-27 DIAGNOSIS — M810 Age-related osteoporosis without current pathological fracture: Secondary | ICD-10-CM | POA: Diagnosis present

## 2020-10-27 DIAGNOSIS — Z882 Allergy status to sulfonamides status: Secondary | ICD-10-CM

## 2020-10-27 DIAGNOSIS — R410 Disorientation, unspecified: Secondary | ICD-10-CM | POA: Diagnosis not present

## 2020-10-27 DIAGNOSIS — J9601 Acute respiratory failure with hypoxia: Secondary | ICD-10-CM

## 2020-10-27 DIAGNOSIS — Z79899 Other long term (current) drug therapy: Secondary | ICD-10-CM

## 2020-10-27 DIAGNOSIS — R29810 Facial weakness: Secondary | ICD-10-CM | POA: Diagnosis not present

## 2020-10-27 DIAGNOSIS — R404 Transient alteration of awareness: Secondary | ICD-10-CM | POA: Diagnosis not present

## 2020-10-27 DIAGNOSIS — G309 Alzheimer's disease, unspecified: Secondary | ICD-10-CM | POA: Diagnosis not present

## 2020-10-27 DIAGNOSIS — R000 Tachycardia, unspecified: Secondary | ICD-10-CM | POA: Diagnosis not present

## 2020-10-27 DIAGNOSIS — J90 Pleural effusion, not elsewhere classified: Secondary | ICD-10-CM | POA: Diagnosis not present

## 2020-10-27 DIAGNOSIS — Z7401 Bed confinement status: Secondary | ICD-10-CM | POA: Diagnosis not present

## 2020-10-27 DIAGNOSIS — G9340 Encephalopathy, unspecified: Secondary | ICD-10-CM

## 2020-10-27 DIAGNOSIS — Z887 Allergy status to serum and vaccine status: Secondary | ICD-10-CM

## 2020-10-27 DIAGNOSIS — R4781 Slurred speech: Secondary | ICD-10-CM | POA: Diagnosis not present

## 2020-10-27 DIAGNOSIS — J811 Chronic pulmonary edema: Secondary | ICD-10-CM | POA: Diagnosis not present

## 2020-10-27 LAB — I-STAT CHEM 8, ED
BUN: 29 mg/dL — ABNORMAL HIGH (ref 8–23)
Calcium, Ion: 1.06 mmol/L — ABNORMAL LOW (ref 1.15–1.40)
Chloride: 105 mmol/L (ref 98–111)
Creatinine, Ser: 1.3 mg/dL — ABNORMAL HIGH (ref 0.44–1.00)
Glucose, Bld: 106 mg/dL — ABNORMAL HIGH (ref 70–99)
HCT: 45 % (ref 36.0–46.0)
Hemoglobin: 15.3 g/dL — ABNORMAL HIGH (ref 12.0–15.0)
Potassium: 4.1 mmol/L (ref 3.5–5.1)
Sodium: 139 mmol/L (ref 135–145)
TCO2: 27 mmol/L (ref 22–32)

## 2020-10-27 LAB — DIFFERENTIAL
Abs Immature Granulocytes: 0.03 10*3/uL (ref 0.00–0.07)
Basophils Absolute: 0 10*3/uL (ref 0.0–0.1)
Basophils Relative: 0 %
Eosinophils Absolute: 0 10*3/uL (ref 0.0–0.5)
Eosinophils Relative: 0 %
Immature Granulocytes: 0 %
Lymphocytes Relative: 10 %
Lymphs Abs: 0.7 10*3/uL (ref 0.7–4.0)
Monocytes Absolute: 0.6 10*3/uL (ref 0.1–1.0)
Monocytes Relative: 8 %
Neutro Abs: 6 10*3/uL (ref 1.7–7.7)
Neutrophils Relative %: 82 %

## 2020-10-27 LAB — COMPREHENSIVE METABOLIC PANEL
ALT: 22 U/L (ref 0–44)
AST: 24 U/L (ref 15–41)
Albumin: 3.4 g/dL — ABNORMAL LOW (ref 3.5–5.0)
Alkaline Phosphatase: 64 U/L (ref 38–126)
Anion gap: 12 (ref 5–15)
BUN: 21 mg/dL (ref 8–23)
CO2: 25 mmol/L (ref 22–32)
Calcium: 8.7 mg/dL — ABNORMAL LOW (ref 8.9–10.3)
Chloride: 101 mmol/L (ref 98–111)
Creatinine, Ser: 1.24 mg/dL — ABNORMAL HIGH (ref 0.44–1.00)
GFR, Estimated: 43 mL/min — ABNORMAL LOW (ref >60)
Glucose, Bld: 117 mg/dL — ABNORMAL HIGH (ref 70–99)
Potassium: 3.9 mmol/L (ref 3.5–5.1)
Sodium: 138 mmol/L (ref 135–145)
Total Bilirubin: 0.8 mg/dL (ref 0.3–1.2)
Total Protein: 6.2 g/dL — ABNORMAL LOW (ref 6.5–8.1)

## 2020-10-27 LAB — I-STAT ARTERIAL BLOOD GAS, ED
Acid-Base Excess: 2 mmol/L (ref 0.0–2.0)
Bicarbonate: 25.2 mmol/L (ref 20.0–28.0)
Calcium, Ion: 1.17 mmol/L (ref 1.15–1.40)
HCT: 42 % (ref 36.0–46.0)
Hemoglobin: 14.3 g/dL (ref 12.0–15.0)
O2 Saturation: 98 %
Potassium: 3.9 mmol/L (ref 3.5–5.1)
Sodium: 138 mmol/L (ref 135–145)
TCO2: 26 mmol/L (ref 22–32)
pCO2 arterial: 35.4 mmHg (ref 32.0–48.0)
pH, Arterial: 7.461 — ABNORMAL HIGH (ref 7.350–7.450)
pO2, Arterial: 94 mmHg (ref 83.0–108.0)

## 2020-10-27 LAB — D-DIMER, QUANTITATIVE: D-Dimer, Quant: 1.49 ug/mL-FEU — ABNORMAL HIGH (ref 0.00–0.50)

## 2020-10-27 LAB — POC SARS CORONAVIRUS 2 AG -  ED: SARS Coronavirus 2 Ag: POSITIVE — AB

## 2020-10-27 LAB — PROTIME-INR
INR: 1 (ref 0.8–1.2)
Prothrombin Time: 13 seconds (ref 11.4–15.2)

## 2020-10-27 LAB — CBC
HCT: 45.7 % (ref 36.0–46.0)
Hemoglobin: 14.4 g/dL (ref 12.0–15.0)
MCH: 29 pg (ref 26.0–34.0)
MCHC: 31.5 g/dL (ref 30.0–36.0)
MCV: 92 fL (ref 80.0–100.0)
Platelets: 171 10*3/uL (ref 150–400)
RBC: 4.97 MIL/uL (ref 3.87–5.11)
RDW: 13.1 % (ref 11.5–15.5)
WBC: 7.4 10*3/uL (ref 4.0–10.5)
nRBC: 0 % (ref 0.0–0.2)

## 2020-10-27 LAB — FIBRINOGEN: Fibrinogen: 348 mg/dL (ref 210–475)

## 2020-10-27 LAB — FERRITIN: Ferritin: 804 ng/mL — ABNORMAL HIGH (ref 11–307)

## 2020-10-27 LAB — TRIGLYCERIDES: Triglycerides: 83 mg/dL (ref <150)

## 2020-10-27 LAB — LACTIC ACID, PLASMA: Lactic Acid, Venous: 1.5 mmol/L (ref 0.5–1.9)

## 2020-10-27 LAB — APTT: aPTT: 28 seconds (ref 24–36)

## 2020-10-27 LAB — PROCALCITONIN: Procalcitonin: 0.54 ng/mL

## 2020-10-27 LAB — C-REACTIVE PROTEIN: CRP: 3 mg/dL — ABNORMAL HIGH (ref <1.0)

## 2020-10-27 LAB — LACTATE DEHYDROGENASE: LDH: 184 U/L (ref 98–192)

## 2020-10-27 MED ORDER — SODIUM CHLORIDE 0.9 % IV SOLN
100.0000 mg | Freq: Every day | INTRAVENOUS | Status: AC
  Administered 2020-10-28 – 2020-10-31 (×4): 100 mg via INTRAVENOUS
  Filled 2020-10-27 (×2): qty 20
  Filled 2020-10-27: qty 100
  Filled 2020-10-27: qty 20

## 2020-10-27 MED ORDER — SODIUM CHLORIDE 0.9 % IV SOLN
2.0000 g | INTRAVENOUS | Status: DC
  Administered 2020-10-28 – 2020-10-31 (×5): 2 g via INTRAVENOUS
  Filled 2020-10-27 (×5): qty 20

## 2020-10-27 MED ORDER — ACETAMINOPHEN 650 MG RE SUPP: 650.0000 mg | Freq: Four times a day (QID) | RECTAL | Status: DC | PRN

## 2020-10-27 MED ORDER — IOHEXOL 350 MG/ML SOLN
75.0000 mL | Freq: Once | INTRAVENOUS | Status: AC | PRN
  Administered 2020-10-27: 75 mL via INTRAVENOUS

## 2020-10-27 MED ORDER — ACETAMINOPHEN 325 MG PO TABS
650.0000 mg | ORAL_TABLET | Freq: Once | ORAL | Status: AC
  Administered 2020-10-27: 650 mg via ORAL
  Filled 2020-10-27: qty 2

## 2020-10-27 MED ORDER — SODIUM CHLORIDE 0.9 % IV SOLN
200.0000 mg | Freq: Once | INTRAVENOUS | Status: AC
  Administered 2020-10-28: 200 mg via INTRAVENOUS
  Filled 2020-10-27: qty 40

## 2020-10-27 MED ORDER — SODIUM CHLORIDE 0.9 % IV SOLN
100.0000 mg | Freq: Two times a day (BID) | INTRAVENOUS | Status: DC
  Administered 2020-10-28 – 2020-10-31 (×9): 100 mg via INTRAVENOUS
  Filled 2020-10-27 (×12): qty 100

## 2020-10-27 MED ORDER — METHYLPREDNISOLONE SODIUM SUCC 40 MG IJ SOLR
40.0000 mg | Freq: Two times a day (BID) | INTRAMUSCULAR | Status: DC
  Administered 2020-10-28 – 2020-11-01 (×10): 40 mg via INTRAVENOUS
  Filled 2020-10-27 (×10): qty 1

## 2020-10-27 MED ORDER — ACETAMINOPHEN 325 MG PO TABS: 650.0000 mg | ORAL_TABLET | Freq: Four times a day (QID) | ORAL | Status: DC | PRN

## 2020-10-27 MED ORDER — ACETAMINOPHEN 325 MG PO TABS
650.0000 mg | ORAL_TABLET | Freq: Once | ORAL | Status: DC
  Filled 2020-10-27: qty 2

## 2020-10-27 MED ORDER — PREDNISONE 5 MG PO TABS: 50.0000 mg | ORAL_TABLET | Freq: Every day | ORAL | Status: DC

## 2020-10-27 MED ORDER — SODIUM CHLORIDE 0.9% FLUSH
3.0000 mL | Freq: Once | INTRAVENOUS | Status: AC
Start: 2020-10-27 — End: 2020-10-27
  Administered 2020-10-27: 3 mL via INTRAVENOUS

## 2020-10-27 MED ORDER — ENOXAPARIN SODIUM 40 MG/0.4ML ~~LOC~~ SOLN
40.0000 mg | Freq: Every day | SUBCUTANEOUS | Status: DC
  Administered 2020-10-28 – 2020-11-12 (×16): 40 mg via SUBCUTANEOUS
  Filled 2020-10-27 (×17): qty 0.4

## 2020-10-27 NOTE — ED Notes (Signed)
Pt placed on 2L [place] for O2 sats of 83% on RA.

## 2020-10-27 NOTE — Telephone Encounter (Signed)
Noted  

## 2020-10-27 NOTE — Telephone Encounter (Signed)
Agreed, will await ER notes.  Thanks.  

## 2020-10-27 NOTE — ED Triage Notes (Signed)
Pt arrives via EMS from home with stroke symptoms since yesterday or the day prior. Unsteady gait, confusion, slurred speech, aphasia noted by family this morning but couldn't give LSN.  Hx of dementia. 20g L hand. 150/90. HR 126. RR 22. SPO 92% RA. CBG 93.

## 2020-10-27 NOTE — Consult Note (Signed)
NAME:  MAIZE BRITTINGHAM, MRN:  [id number], DOB:  [birth year], LOS: 0 ADMISSION DATE:  10/27/2020, CONSULTATION DATE:  10/27/20 REFERRING MD:  EDP, CHIEF COMPLAINT:  Respiratory insufficiency   Brief History:  84 y.o. F with PMH of ocular Myasthenia Gravis, Alzheimer's Type Dementia, anxiety, depression, fibromyalgia, osteoporosis who presented with hypoxia and confusion.  CT head negative, Covid-19 positive.  PCCM consulted in the event patient is unable to protect her airway  History of Present Illness:  [name] [name] is a 84 y.o. F with PMH ocular Myasthenia Gravis, Alzheimer's Type Dementia, anxiety, depression, fibromyalgia, osteoporosis who presented with hypoxia and confusion.  Per records she was mumbling and not eating well for the last 1-2 days and having trouble walking.  This morning family noted slurred speech and aphasia, so was brought to the ED.   During ED course, pt febrile 101.8, RA sats of 83%, so was placed on [place] oxygen, remains tachycardic and normotensive.  CT head negative.  Pt's covid-19 test was positive.  Patient unable to follow commands to perform NIF.  Labs noted for normal CBC with diff, normal lactic acid, glucose 106.  UA pending.  Blood cultures sent.  ABG showing 7.461/ 35.4/ 94/ 25.2.  CXR showing wedge shaped area of GGO in right mid lung, bibasilar atelectasis.  Neurology to be consulted in ER.  PCCM consulted for AMS in the setting of Covid-19 and Myasthenia.   Past Medical History:   has a past medical history of Achilles tendonitis (10/28/2012), Alzheimer's type dementia (HCC) (06/10/2013), Anxiety, Depression, Fibromyalgia, GERD (gastroesophageal reflux disease), Memory loss, Myasthenia gravis, Nocardia infection, and Osteoporosis.  Significant Hospital Events:  12/29 presented to hospital  Consults:  Neurology PCCM  Procedures:   Significant Diagnostic Tests:   12/29 [facility] >> 1. No acute intracranial pathology. 2. Chronic microvascular disease and  cerebral atrophy.  12/29 CXR >> 1. Wedge-shaped area of ground-glass attenuation within the lateral right midlung zone. Given clinical presentation of confusion and CVA symptoms, aspiration could be considered. Alternatively, this could reflect pneumonia. 2. Low lung volumes with bibasilar atelectasis.  Micro Data:  12/29 COVID >> positive  12/29 BCx 2 >>  Antimicrobials:  Ceftriaxone 12/29- Azithromycin 12/29-  Interim History / Subjective:   As above  Objective   Blood pressure 126/78, pulse (!) 120, temperature 100 F (37.8 C), temperature source Oral, resp. rate (!) 29, SpO2 96 %.       No intake or output data in the 24 hours ending 10/27/20 1707 There were no vitals filed for this visit.  General:  Elderly F, well nourished, resting in bed without acute distress HEENT: MM pink/moist Neuro: awake and conversational, no slurred speech or somnolence, markedly confused.  Able to state name and birthdate, but disoriented to everything else, no lid ptosis and no trouble swallowing secretions CV: s1s2 rrr, no m/r/g PULM:  Mild rhonchi RML, no wheezing, retractions or nasal flaring GI: soft, bsx4 active  Extremities: warm/dry, 1+ pre-tibial edema  Skin: no rashes or lesions   Resolved Hospital Problem list     Assessment & Plan:   Hypoxic respiratory failure in the setting of Covid-19 and Myasthenia Gravis Likely secondary to COVID PNA, not vaccinated, has wedge-shaped R-sided infiltrate but sats currently >94% on 2L [place], ABG without respiratory acidosis or alkalosis  P:  -recommend CAP coverage, Azithromycin and Ceftriaxone -trend pro-calcitonin -start Decadron 6mg  qd, Remdesevir and consider Baricitinib -Pt is assuredly at high risk of respiratory decompensation, however per notes appears that  she has mostly had ocular myasthenia without hospitalizations in our system, no ptosis and is currently protecting her airway well on 2L [address] -Self proning as tolerated -Trend  inflammatory markers -Ongoing aggressive pulmonary hygiene, progress activity as tolerated  -She is stable currently for admission to the floor, please contact PCCM for worsening clinical status -Monitor closely for signs of ventilatory failure such as nasal flaring, accessory muscle use, abdominal paradoxical movements.      Myasthenia Gravis, appears mostly ocular - at risk for acute exacerbation given acute illness, but currently stable Plan: - monitor for airway protection - NIF and VC if patient can participate, doubt she will be able to follow commands given baseline dementia - home prednisone 7.5mg  daily, start Dexamethasone, if shows signs of adrenal insufficiency may need stress dose    R/o acute stroke Acute encephalopathy, speech clear, though is highly confused on exam P: - CTH neg - Neurology consult pending  - NPO- did not pass bedside swallow    Dementia  - ongoing GOC discussions - hold home meds- namenda, Seroquel  Best practice (evaluated daily)  Diet: NPO Pain/Anxiety/Delirium protocol (if indicated):  n/a VAP protocol (if indicated): n/a DVT prophylaxis: per primary GI prophylaxis: n/a Glucose control: per primary Mobility: bed rest Disposition: tele or progressive  Goals of Care:  Last date of multidisciplinary goals of care discussion: Family and staff present:  Summary of discussion:  Follow up goals of care discussion due:  Code Status: Full code, EDP discussed with daughter  Labs   CBC: Recent Labs  Lab 10/27/20 1418 10/27/20 1449  WBC 7.4  --   NEUTROABS 6.0  --   HGB 14.4 15.3*  HCT 45.7 45.0  MCV 92.0  --   PLT 171  --     Basic Metabolic Panel: Recent Labs  Lab 10/27/20 1418 10/27/20 1449  NA 138 139  K 3.9 4.1  CL 101 105  CO2 25  --   GLUCOSE 117* 106*  BUN 21 29*  CREATININE 1.24* 1.30*  CALCIUM 8.7*  --    GFR: CrCl cannot be calculated (Unknown ideal weight.). Recent Labs  Lab 10/27/20 1418  10/27/20 1605  WBC 7.4  --   LATICACIDVEN  --  1.5    Liver Function Tests: Recent Labs  Lab 10/27/20 1418  AST 24  ALT 22  ALKPHOS 64  BILITOT 0.8  PROT 6.2*  ALBUMIN 3.4*   No results for input(s): LIPASE, AMYLASE in the last 168 hours. No results for input(s): AMMONIA in the last 168 hours.  ABG    Component Value Date/Time   TCO2 27 10/27/2020 1449     Coagulation Profile: No results for input(s): INR, PROTIME in the last 168 hours.  Cardiac Enzymes: No results for input(s): CKTOTAL, CKMB, CKMBINDEX, TROPONINI in the last 168 hours.  HbA1C: Hgb A1c MFr Bld  Date/Time Value Ref Range Status  01/16/2019 09:17 AM 6.2 4.6 - 6.5 % Final    Comment:    Glycemic Control Guidelines for People with Diabetes:Non Diabetic:  <6%Goal of Therapy: <7%Additional Action Suggested:  >8%   11/27/2017 10:24 AM 6.1 4.6 - 6.5 % Final    Comment:    Glycemic Control Guidelines for People with Diabetes:Non Diabetic:  <6%Goal of Therapy: <7%Additional Action Suggested:  >8%     CBG: No results for input(s): GLUCAP in the last 168 hours.  Review of Systems:   Unable to obtain secondary to mental status  Past Medical History:  She,  has a past medical history of Achilles tendonitis (10/28/2012), Alzheimer's type dementia (HCC) (06/10/2013), Anxiety, Depression, Fibromyalgia, GERD (gastroesophageal reflux disease), Memory loss, Myasthenia gravis, Nocardia infection, and Osteoporosis.   Surgical History:   Past Surgical History:  Procedure Laterality Date  . ABDOMINAL HYSTERECTOMY    . APPENDECTOMY    . BLADDER REPAIR    . BREAST BIOPSY    . CHOLECYSTECTOMY    . TONSILLECTOMY AND ADENOIDECTOMY       Social History:   reports that she has never smoked. She has never used smokeless tobacco. She reports that she does not drink alcohol and does not use drugs.   Family History:  Her family history includes Breast cancer in her mother; Cancer in her mother; Cirrhosis in her  father; Heart disease in her brother and paternal uncle; Hypertension in her brother and paternal uncle. There is no history of Diabetes or Colon cancer.   Allergies Allergies  Allergen Reactions  . Alendronate Sodium     REACTION: upset stomach  . Donepezil Hcl     intolerant  . Sulfa Antibiotics     rash  . Sulfasalazine Hives    rash  . Tetanus Toxoids     rash  . Vesicare [Solifenacin]     dysuria     Home Medications  Prior to Admission medications   Medication Sig Start Date End Date Taking? Authorizing Provider  acetaminophen (TYLENOL) 500 MG tablet Take 1 tablet (500 mg total) by mouth every 8 (eight) hours as needed (for pain). 07/05/20   Joaquim Nam, MD  Calcium-Magnesium-Vitamin D (CALCIUM 500 PO) Take by mouth.    [provider]  cholecalciferol (VITAMIN D) 1000 units tablet Take 1,000 Units by mouth daily.    [provider]  cyanocobalamin 1000 MCG tablet Take 1,000 mcg by mouth daily.    [provider]  hydrOXYzine (ATARAX/VISTARIL) 10 MG tablet Take 0.5 tablets (5 mg total) by mouth 3 (three) times daily as needed for anxiety. 07/05/20   Joaquim Nam, MD  memantine (NAMENDA) 10 MG tablet TAKE 1 TABLET BY MOUTH 2 TIMES DAILY. 02/23/20   Nita Sickle K, DO  mirtazapine (REMERON) 7.5 MG tablet TAKE 1 TABLET BY MOUTH AT BEDTIME. 07/14/20   Joaquim Nam, MD  omeprazole (PRILOSEC) 20 MG capsule Take 1 capsule (20 mg total) by mouth daily. 02/19/18   Joaquim Nam, MD  predniSONE (DELTASONE) 5 MG tablet Take 1.5 tablets (7.5 mg total) by mouth daily with breakfast. 07/23/20   Nita Sickle K, DO  pyridostigmine (MESTINON) 60 MG tablet Take 1 tablet (60 mg total) by mouth 3 (three) times daily. 07/23/20   Nita Sickle K, DO  QUEtiapine (SEROQUEL) 25 MG tablet TAKE 1/2 TABLET BY MOUTH AT BEDTIME. 09/01/20   Glendale Chard, DO     Critical care time: 45 minutes       CRITICAL CARE Performed by: Darcella Gasman Tommy Minichiello   Total critical  care time: 45 minutes  Critical care time was exclusive of separately billable procedures and treating other patients.  Critical care was necessary to treat or prevent imminent or life-threatening deterioration.  Critical care was time spent personally by me on the following activities: development of treatment plan with patient and/or surrogate as well as nursing, discussions with consultants, evaluation of patient's response to treatment, examination of patient, obtaining history from patient or surrogate, ordering and performing treatments and interventions, ordering and review of laboratory studies, ordering and review of radiographic  studies, pulse oximetry and re-evaluation of patient's condition.]   Darcella GasmanLaura R Kenadee Gates, PA-C [facility]  Pager# [phone], if no answer [phone]

## 2020-10-27 NOTE — ED Notes (Signed)
Pt unable to pass a swallow eval to take PO tylenol

## 2020-10-27 NOTE — Progress Notes (Addendum)
RT asked by RN to have pt do a NIF but pt unable to do at this time. Pt unable to follow directions. NIF remains at bedside

## 2020-10-27 NOTE — Telephone Encounter (Signed)
Tammy pts daughter (DPR signed) went to her parents home due to her father having CP and when she went to ck on her mom;pt's mom cannot get out of bed; is mumbling and cannot answer any questions or talk normally. I asked Tammy when this started and Tammy said her dad told her that on 10/26/20 pt was having problems walking, was mumbling and did not eat anything on 10/26/20. I asked Tammy if she wanted me to call EMS for both her parents and she said yes. I stayed on phone until EMS arrived at her parents home and [name] was appreciative. Sending note to Dr Para March who is out of office and Dr Reece Agar who is in office.

## 2020-10-27 NOTE — ED Provider Notes (Addendum)
[facility] EMERGENCY DEPARTMENT Provider Note   CSN: [id number] Arrival date & time: 10/27/20  1336     History Chief Complaint  Patient presents with  . Stroke Symptoms  . Fever    ARDYN FORGE is a 84 y.o. female with PMH of Alzheimer's dementia and myasthenia gravis who presents the ED via EMS from home with reports of unsteady gait, confusion, slurred speech, and aphasia with last known normal yesterday.  On my examination, patient is able to maintain eye contact, but her speech is entirely garbled.  Cannot understand anything that she is trying to say.  Expressive aphasia.  Does not appear to be in any acute distress.  I obtained history from her daughters Synetta Fail and [address].  In short, patient is very mobile at baseline and does not even require any assistive devices.  While she does have Alzheimer's disease, she is still functional and independent.  No specific difficulties with ADLs.  However, they received a phone call from their father (patient's husband) last evening stating that she was feeling weak and altered.  This morning she was even worse and when Tammy arrived to the house the patient was "stiff as a board, arms drawn up to the chest, and fingers contracted".  She was also mumbling and unable to perform words or sentences.  This is a significant deviation from her baseline.  I informed the daughter that she has tested positive for COVID-19 here in the ED and was febrile to 101.8 F which may have contributed to her altered mental status.  She states that her mother (the patient) is unvaccinated, as is her husband who is also COVID-19 positive.  On reevaluation, patient initially is speaking and answering questions, but after about 15 seconds resumed garbled speech and cannot answer simple yes or no questions.  Does not participate in physical exam.    Level 5 caveat due to AMS.  HPI     Past Medical History:  Diagnosis Date  . Achilles tendonitis  10/28/2012  . Alzheimer's type dementia (HCC) 06/10/2013  . Anxiety   . Depression   . Fibromyalgia   . GERD (gastroesophageal reflux disease)   . Memory loss   . Myasthenia gravis   . Nocardia infection    h/o brain abscess  . Osteoporosis     Patient Active Problem List   Diagnosis Date Noted  . Hearing loss 05/14/2019  . Myasthenia gravis (HCC)   . Bilateral impacted cerumen 08/20/2017  . Fungal otitis externa 08/20/2017  . Right ear pain 08/20/2017  . Healthcare maintenance 08/05/2016  . Advance care planning 08/04/2015  . Vitamin D deficiency 08/04/2015  . Knee pain 10/25/2014  . Alzheimer's type dementia (HCC)   . Medicare annual wellness visit, subsequent 02/12/2012  . FIBROMYALGIA 05/10/2010  . Osteoporosis 05/10/2010  . ANXIETY 11/12/2009  . DEPRESSION 02/09/2009  . MYASTHENIA GRAVIS WITHOUT EXACERBATION 02/09/2009  . GERD 02/09/2009    Past Surgical History:  Procedure Laterality Date  . ABDOMINAL HYSTERECTOMY    . APPENDECTOMY    . BLADDER REPAIR    . BREAST BIOPSY    . CHOLECYSTECTOMY    . TONSILLECTOMY AND ADENOIDECTOMY       OB History   No obstetric history on file.     Family History  Problem Relation Age of Onset  . Hypertension Brother   . Heart disease Brother   . Heart disease Paternal Uncle   . Hypertension Paternal Uncle   . Breast  cancer Mother   . Cancer Mother   . Cirrhosis Father   . Diabetes Neg Hx   . Colon cancer Neg Hx     Social History   Tobacco Use  . Smoking status: Never Smoker  . Smokeless tobacco: Never Used  Vaping Use  . Vaping Use: Never used  Substance Use Topics  . Alcohol use: No    Alcohol/week: 0.0 standard drinks  . Drug use: No    Home Medications Prior to Admission medications   Medication Sig Start Date End Date Taking? Authorizing Provider  acetaminophen (TYLENOL) 500 MG tablet Take 1 tablet (500 mg total) by mouth every 8 (eight) hours as needed (for pain). 07/05/20   Joaquim Nam, MD   Calcium-Magnesium-Vitamin D (CALCIUM 500 PO) Take by mouth.    [provider]  cholecalciferol (VITAMIN D) 1000 units tablet Take 1,000 Units by mouth daily.    [provider]  cyanocobalamin 1000 MCG tablet Take 1,000 mcg by mouth daily.    [provider]  hydrOXYzine (ATARAX/VISTARIL) 10 MG tablet Take 0.5 tablets (5 mg total) by mouth 3 (three) times daily as needed for anxiety. 07/05/20   Joaquim Nam, MD  memantine (NAMENDA) 10 MG tablet TAKE 1 TABLET BY MOUTH 2 TIMES DAILY. Patient taking differently: Take 10 mg by mouth 2 (two) times daily. 02/23/20   Patel, Donika K, DO  mirtazapine (REMERON) 7.5 MG tablet TAKE 1 TABLET BY MOUTH AT BEDTIME. Patient taking differently: Take 7.5 mg by mouth at bedtime. 07/14/20   Joaquim Nam, MD  omeprazole (PRILOSEC) 20 MG capsule Take 1 capsule (20 mg total) by mouth daily. 02/19/18   Joaquim Nam, MD  predniSONE (DELTASONE) 5 MG tablet Take 1.5 tablets (7.5 mg total) by mouth daily with breakfast. 07/23/20   Nita Sickle K, DO  pyridostigmine (MESTINON) 60 MG tablet Take 1 tablet (60 mg total) by mouth 3 (three) times daily. 07/23/20   Patel, Donika K, DO  QUEtiapine (SEROQUEL) 25 MG tablet TAKE 1/2 TABLET BY MOUTH AT BEDTIME. Patient taking differently: Take 12.5 mg by mouth at bedtime. 09/01/20   Nita Sickle K, DO    Allergies    Alendronate sodium, Donepezil hcl, Sulfa antibiotics, Sulfasalazine, Tetanus toxoids, and Vesicare [solifenacin]  Review of Systems   Review of Systems  Unable to perform ROS: Mental status change    Physical Exam Updated Vital Signs BP 100/75   Pulse (!) 106   Temp 100 F (37.8 C) (Oral)   Resp (!) 24   SpO2 90%   Physical Exam Vitals and nursing note reviewed. Exam conducted with a chaperone present.  Constitutional:      General: She is not in acute distress.    Appearance: She is not toxic-appearing.     Comments: Hands clasped over chest.  HENT:     Head:  Normocephalic and atraumatic.  Eyes:     General: No scleral icterus.    Conjunctiva/sclera: Conjunctivae normal.  Cardiovascular:     Rate and Rhythm: Regular rhythm. Tachycardia present.  Pulmonary:     Effort: Pulmonary effort is normal.  Musculoskeletal:     Cervical back: Normal range of motion.  Skin:    General: Skin is dry.  Neurological:     Mental Status: She is alert.     GCS: GCS eye subscore is 4. GCS verbal subscore is 5. GCS motor subscore is 6.  Psychiatric:        Mood and Affect:  Mood normal.        Behavior: Behavior normal.        Thought Content: Thought content normal.     ED Results / Procedures / Treatments   Labs (all labs ordered are listed, but only abnormal results are displayed) Labs Reviewed  COMPREHENSIVE METABOLIC PANEL - Abnormal; Notable for the following components:      Result Value   Glucose, Bld 117 (*)    Creatinine, Ser 1.24 (*)    Calcium 8.7 (*)    Total Protein 6.2 (*)    Albumin 3.4 (*)    GFR, Estimated 43 (*)    All other components within normal limits  D-DIMER, QUANTITATIVE (NOT AT [facility]) - Abnormal; Notable for the following components:   D-Dimer, Quant 1.49 (*)    All other components within normal limits  FERRITIN - Abnormal; Notable for the following components:   Ferritin 804 (*)    All other components within normal limits  C-REACTIVE PROTEIN - Abnormal; Notable for the following components:   CRP 3.0 (*)    All other components within normal limits  I-STAT CHEM 8, ED - Abnormal; Notable for the following components:   BUN 29 (*)    Creatinine, Ser 1.30 (*)    Glucose, Bld 106 (*)    Calcium, Ion 1.06 (*)    Hemoglobin 15.3 (*)    All other components within normal limits  POC SARS CORONAVIRUS 2 AG -  ED - Abnormal; Notable for the following components:   SARS Coronavirus 2 Ag POSITIVE (*)    All other components within normal limits  I-STAT ARTERIAL BLOOD GAS, ED - Abnormal; Notable for the following  components:   pH, Arterial 7.461 (*)    All other components within normal limits  CULTURE, BLOOD (ROUTINE X 2)  CULTURE, BLOOD (ROUTINE X 2) W REFLEX TO ID PANEL  CBC  DIFFERENTIAL  LACTIC ACID, PLASMA  PROTIME-INR  APTT  PROCALCITONIN  LACTATE DEHYDROGENASE  TRIGLYCERIDES  FIBRINOGEN  LACTIC ACID, PLASMA  URINALYSIS, ROUTINE W REFLEX MICROSCOPIC  PROCALCITONIN  PROCALCITONIN  CBG MONITORING, ED    EKG EKG Interpretation  Date/Time:  Wednesday October 27 2020 15:57:29 EST Ventricular Rate:  116 PR Interval:    QRS Duration: 121 QT Interval:  363 QTC Calculation: 505 R Axis:   81 Text Interpretation: Sinus tachycardia Ventricular premature complex Probable left atrial enlargement Right bundle branch block Artifact in lead(s) I II III aVR aVL aVF V2 V3 V4 V5 V6 No prior ECG before today available. Similr appearance. No STEMI Confirmed by Theda Belfast ([id number]) on 10/27/2020 6:48:23 PM   Radiology CT HEAD WO CONTRAST  Result Date: 10/27/2020 CLINICAL DATA:  Slurred speech, unsteady gait EXAM: CT HEAD WITHOUT CONTRAST TECHNIQUE: Contiguous axial images were obtained from the base of the skull through the vertex without intravenous contrast. COMPARISON:  05/14/2010 FINDINGS: Brain: No evidence of acute infarction, hemorrhage, extra-axial collection, ventriculomegaly, or mass effect. Generalized cerebral atrophy. Periventricular white matter low attenuation likely secondary to microangiopathy. Vascular: Cerebrovascular atherosclerotic calcifications are noted. Skull: Negative for fracture or focal lesion. Sinuses/Orbits: Visualized portions of the orbits are unremarkable. Visualized portions of the paranasal sinuses are unremarkable. Visualized portions of the mastoid air cells are unremarkable. Other: None. IMPRESSION: 1. No acute intracranial pathology. 2. Chronic microvascular disease and cerebral atrophy. Electronically Signed   By: Elige Ko   On: 10/27/2020 14:26   DG  Chest Port 1 View  Result Date: 10/27/2020 CLINICAL DATA:  COVID-19, hypoxia, CVA symptoms, confusion EXAM: PORTABLE CHEST 1 VIEW COMPARISON:  None. FINDINGS: Single frontal view of the chest demonstrates an unremarkable cardiac silhouette. The lung volumes are diminished, with patchy areas of bibasilar consolidation that could reflect atelectasis. There is a wedge-shaped area of ground-glass attenuation in the right lateral midlung zone. Given clinical presentation, aspiration could be considered, as well as infection. No effusion or pneumothorax. Prominent skin fold overlies the left lateral costophrenic angle. No acute bony abnormalities. IMPRESSION: 1. Wedge-shaped area of ground-glass attenuation within the lateral right midlung zone. Given clinical presentation of confusion and CVA symptoms, aspiration could be considered. Alternatively, this could reflect pneumonia. 2. Low lung volumes with bibasilar atelectasis. Electronically Signed   By: Sharlet SalinaMichael  Brown M.D.   On: 10/27/2020 17:34    Procedures .Critical Care Performed by: Lorelee NewGreen, Jakeob Tullis L, PA-C Authorized by: Lorelee NewGreen, Kirstie Larsen L, PA-C   Critical care provider statement:    Critical care time (minutes):  45   Critical care was necessary to treat or prevent imminent or life-threatening deterioration of the following conditions:  Respiratory failure and CNS failure or compromise   Critical care was time spent personally by me on the following activities:  Discussions with consultants, evaluation of patient's response to treatment, examination of patient, ordering and performing treatments and interventions, ordering and review of laboratory studies, ordering and review of radiographic studies, pulse oximetry, re-evaluation of patient's condition, obtaining history from patient or surrogate, review of old charts, blood draw for specimens and development of treatment plan with patient or surrogate   (including critical care time)  Medications  Ordered in ED Medications  sodium chloride flush (NS) 0.9 % injection 3 mL (has no administration in time range)  acetaminophen (TYLENOL) tablet 650 mg (650 mg Oral Given 10/27/20 1639)    ED Course  I have reviewed the triage vital signs and the nursing notes.  Pertinent labs & imaging results that were available during my care of the patient were reviewed by me and considered in my medical decision making (see chart for details).  Clinical Course as of 10/27/20 1912  Wed Oct 27, 2020  1618 Temp(!): 101.8 F (38.8 C) [GG]  1618 Pulse Rate(!): 128 [GG]  1618 SARS Coronavirus 2 Ag(!): POSITIVE [GG]  1702 I spoke with critical care who will come evaluate patient. [GG]  1710 I spoke with Dr. Otelia LimesLindzen and given concern for hypercoaguability in context of COVID-19 infection recommended stat MRI and CTA head and neck. [GG]    Clinical Course User Index [GG] Lorelee NewGreen, Behr Cislo L, PA-C   MDM Rules/Calculators/A&P                          Labs CBC with differential: No anemia or leukocytosis concerning for infection. CMP: CMP is largely unremarkable and consistent with baseline. Blood cultures: Pending. Lactic acid: Pending POC COVID-19 antigen test: Positive.  Patient was noted by RN to have dipped to 82% on RA, subsequently placed on 2 L supplemental O2 via [place].  No prior oxygen requirements.  We will treat patient's fever here in the ED with Tylenol 650 mg, assuming she can pass the stroke swallow screen.  Plan to consult neurology.    Given concern for myasthenia crisis, consulted respiratory therapy and they will perform NIF.  She is full code and I confirmed this on the phone with her daughter Babette Relicammy.  Will consult critical care.      I spoke with critical care  who will come evaluate patient.  I spoke with Dr. Otelia Limes, neuro hospitalist, and given concern for hypercoaguability in context of COVID-19 infection he recommended stat MRI and CTA head and neck.  Patient was evaluated by Dr.  Chestine Spore with critical care and evidently her myasthenia gravis is ocular and without any respiratory involvement.  She states that she is reasonable for admission to [facility].  Will consult hospitalist for admission.   Final Clinical Impression(s) / ED Diagnoses Final diagnoses:  Altered mental status, unspecified altered mental status type    Rx / DC Orders ED Discharge Orders    None       Lorelee New, PA-C 10/27/20 1731    Lorelee New, PA-C 10/27/20 1920    Tegeler, Canary Brim, MD 10/27/20 [phone]

## 2020-10-27 NOTE — H&P (Addendum)
History and Physical    [name] BryantHilda A Riegler ZOX:[id number] DOB: [birth year] DOA: 10/27/2020  PCP: Joaquim Namuncan, Graham S, MD  Patient coming from: Home.  History obtained from ER physician.  Chief Complaint: Increasing confusion.  HPI: [name] [name] is a 84 y.o. female with known history of dementia and ocular myasthenia gravis was found to be increasingly confused by patient's daughter and was brought to the ER.  Per the report patient usually is ambulatory and able to dress herself.  ED Course: In the ER patient appears lethargic and labs reveal creatinine 1.2 CBC largely unremarkable with chest x-ray showing wedge-shaped defect and Covid test was positive.  Critical care was consulted and at this time they felt that patient was stable enough to be admitted to the floor under hospitalist.  Subsequent which patient underwent CT angiogram of the head and neck which does not show anything acute except for possible pneumonia and CT angiogram of the chest was negative for pulmonary embolism but did show consolidation concerning for pneumonia.  Patient was started on empiric antibiotics for community-acquired pneumonia and also Covid pneumonia.  Review of Systems: As per HPI, rest all negative.   Past Medical History:  Diagnosis Date  . Achilles tendonitis 10/28/2012  . Alzheimer's type dementia (HCC) 06/10/2013  . Anxiety   . Depression   . Fibromyalgia   . GERD (gastroesophageal reflux disease)   . Memory loss   . Myasthenia gravis   . Nocardia infection    h/o brain abscess  . Osteoporosis     Past Surgical History:  Procedure Laterality Date  . ABDOMINAL HYSTERECTOMY    . APPENDECTOMY    . BLADDER REPAIR    . BREAST BIOPSY    . CHOLECYSTECTOMY    . TONSILLECTOMY AND ADENOIDECTOMY       reports that she has never smoked. She has never used smokeless tobacco. She reports that she does not drink alcohol and does not use drugs.  Allergies  Allergen Reactions  . Alendronate Sodium      REACTION: upset stomach  . Donepezil Hcl     intolerant  . Sulfa Antibiotics     rash  . Sulfasalazine Hives    rash  . Tetanus Toxoids     rash  . Vesicare [Solifenacin]     dysuria    Family History  Problem Relation Age of Onset  . Hypertension Brother   . Heart disease Brother   . Heart disease Paternal Uncle   . Hypertension Paternal Uncle   . Breast cancer Mother   . Cancer Mother   . Cirrhosis Father   . Diabetes Neg Hx   . Colon cancer Neg Hx     Prior to Admission medications   Medication Sig Start Date End Date Taking? Authorizing Provider  acetaminophen (TYLENOL) 500 MG tablet Take 1 tablet (500 mg total) by mouth every 8 (eight) hours as needed (for pain). 07/05/20   Joaquim Namuncan, Graham S, MD  Calcium-Magnesium-Vitamin D (CALCIUM 500 PO) Take by mouth.    [provider]  cholecalciferol (VITAMIN D) 1000 units tablet Take 1,000 Units by mouth daily.    [provider]  cyanocobalamin 1000 MCG tablet Take 1,000 mcg by mouth daily.    [provider]  hydrOXYzine (ATARAX/VISTARIL) 10 MG tablet Take 0.5 tablets (5 mg total) by mouth 3 (three) times daily as needed for anxiety. 07/05/20   Joaquim Namuncan, Graham S, MD  memantine (NAMENDA) 10 MG tablet TAKE 1 TABLET BY MOUTH  2 TIMES DAILY. Patient taking differently: Take 10 mg by mouth 2 (two) times daily. 02/23/20   Patel, Donika K, DO  mirtazapine (REMERON) 7.5 MG tablet TAKE 1 TABLET BY MOUTH AT BEDTIME. Patient taking differently: Take 7.5 mg by mouth at bedtime. 07/14/20   Joaquim Nam, MD  omeprazole (PRILOSEC) 20 MG capsule Take 1 capsule (20 mg total) by mouth daily. 02/19/18   Joaquim Nam, MD  predniSONE (DELTASONE) 5 MG tablet Take 1.5 tablets (7.5 mg total) by mouth daily with breakfast. 07/23/20   Nita Sickle K, DO  pyridostigmine (MESTINON) 60 MG tablet Take 1 tablet (60 mg total) by mouth 3 (three) times daily. 07/23/20   Patel, Donika K, DO  QUEtiapine (SEROQUEL) 25 MG tablet TAKE 1/2  TABLET BY MOUTH AT BEDTIME. Patient taking differently: Take 12.5 mg by mouth at bedtime. 09/01/20   Glendale Chard, DO    Physical Exam: Constitutional: Moderately built and nourished. Vitals:   10/27/20 1830 10/27/20 1845 10/27/20 1915 10/27/20 2215  BP: 108/71 100/75 (!) 129/94 118/79  Pulse: (!) 103 (!) 106  80  Resp: (!) 26 (!) 24 (!) 25 (!) 21  Temp:      TempSrc:      SpO2: 97% 90% 92% 95%   Eyes: Anicteric no pallor. ENMT: No discharge from the ears eyes nose or mouth. Neck: No mass felt.  No neck rigidity. Respiratory: No rhonchi or crepitations. Cardiovascular: S1-S2 heard. Abdomen: Soft nontender bowel sounds present. Musculoskeletal: No edema. Skin: No rash. Neurologic: Lethargic but moving all extremities not following commands.  Pupils equal and reacting to light.  No obvious ptosis. Psychiatric: Lethargic.   Labs on Admission: I have personally reviewed following labs and imaging studies  CBC: Recent Labs  Lab 10/27/20 1418 10/27/20 1449 10/27/20 1733  WBC 7.4  --   --   NEUTROABS 6.0  --   --   HGB 14.4 15.3* 14.3  HCT 45.7 45.0 42.0  MCV 92.0  --   --   PLT 171  --   --    Basic Metabolic Panel: Recent Labs  Lab 10/27/20 1418 10/27/20 1449 10/27/20 1733  NA 138 139 138  K 3.9 4.1 3.9  CL 101 105  --   CO2 25  --   --   GLUCOSE 117* 106*  --   BUN 21 29*  --   CREATININE 1.24* 1.30*  --   CALCIUM 8.7*  --   --    GFR: CrCl cannot be calculated (Unknown ideal weight.). Liver Function Tests: Recent Labs  Lab 10/27/20 1418  AST 24  ALT 22  ALKPHOS 64  BILITOT 0.8  PROT 6.2*  ALBUMIN 3.4*   No results for input(s): LIPASE, AMYLASE in the last 168 hours. No results for input(s): AMMONIA in the last 168 hours. Coagulation Profile: Recent Labs  Lab 10/27/20 1505  INR 1.0   Cardiac Enzymes: No results for input(s): CKTOTAL, CKMB, CKMBINDEX, TROPONINI in the last 168 hours. BNP (last 3 results) No results for input(s): PROBNP in  the last 8760 hours. HbA1C: No results for input(s): HGBA1C in the last 72 hours. CBG: No results for input(s): GLUCAP in the last 168 hours. Lipid Profile: Recent Labs    10/27/20 1700  TRIG 83   Thyroid Function Tests: No results for input(s): TSH, T4TOTAL, FREET4, T3FREE, THYROIDAB in the last 72 hours. Anemia Panel: Recent Labs    10/27/20 1700  FERRITIN 804*   Urine analysis:  Component Value Date/Time   COLORURINE yellow 06/06/2010 1359   APPEARANCEUR Hazy 06/06/2010 1359   LABSPEC 1.020 06/06/2010 1359   PHURINE 6.5 06/06/2010 1359   GLUCOSEU NEGATIVE 05/14/2010 1654   HGBUR negative 06/06/2010 1359   BILIRUBINUR Neg 07/02/2014 1421   KETONESUR NEGATIVE 05/14/2010 1654   PROTEINUR Neg 07/02/2014 1421   PROTEINUR NEGATIVE 05/14/2010 1654   UROBILINOGEN 0.2 07/02/2014 1421   UROBILINOGEN 0.2 06/06/2010 1359   NITRITE Neg 07/02/2014 1421   NITRITE positive 06/06/2010 1359   LEUKOCYTESUR Negative 07/02/2014 1421   Sepsis Labs: @LABRCNTIP (procalcitonin:4,lacticidven:4) )No results found for this or any previous visit (from the past 240 hour(s)).   Radiological Exams on Admission: CT Angio Head W or Wo Contrast  Result Date: 10/27/2020 CLINICAL DATA:  Unsteady gait with slurred speech EXAM: CT ANGIOGRAPHY HEAD AND NECK TECHNIQUE: Multidetector CT imaging of the head and neck was performed using the standard protocol during bolus administration of intravenous contrast. Multiplanar CT image reconstructions and MIPs were obtained to evaluate the vascular anatomy. Carotid stenosis measurements (when applicable) are obtained utilizing NASCET criteria, using the distal internal carotid diameter as the denominator. CONTRAST:  91mL OMNIPAQUE IOHEXOL 350 MG/ML SOLN COMPARISON:  None. FINDINGS: CTA NECK FINDINGS SKELETON: There is no bony spinal canal stenosis. No lytic or blastic lesion. OTHER NECK: Normal pharynx, larynx and major salivary glands. No cervical lymphadenopathy.  Unremarkable thyroid gland. UPPER CHEST: Areas of consolidation in the right upper lobe AORTIC ARCH: There is calcific atherosclerosis of the aortic arch. There is no aneurysm, dissection or hemodynamically significant stenosis of the visualized portion of the aorta. Conventional 3 vessel aortic branching pattern. The visualized proximal subclavian arteries are widely patent. RIGHT CAROTID SYSTEM: Normal without aneurysm, dissection or stenosis. LEFT CAROTID SYSTEM: Normal without aneurysm, dissection or stenosis. VERTEBRAL ARTERIES: Codominant configuration. Both origins are clearly patent. There is no dissection, occlusion or flow-limiting stenosis to the skull base (V1-V3 segments). CTA HEAD FINDINGS POSTERIOR CIRCULATION: --Vertebral arteries: Normal V4 segments. --Inferior cerebellar arteries: Normal. --Basilar artery: Normal. --Superior cerebellar arteries: Normal. --Posterior cerebral arteries (PCA): Normal. ANTERIOR CIRCULATION: --Intracranial internal carotid arteries: Normal. --Anterior cerebral arteries (ACA): Normal. Both A1 segments are present. Patent anterior communicating artery (a-comm). --Middle cerebral arteries (MCA): Normal. VENOUS SINUSES: As permitted by contrast timing, patent. ANATOMIC VARIANTS: None Review of the MIP images confirms the above findings. IMPRESSION: 1. No emergent large vessel occlusion or high-grade stenosis of the intracranial arteries. 2. Areas of consolidation in the right upper lobe, concerning for pneumonia. Aortic Atherosclerosis (ICD10-I70.0). Electronically Signed   By: 74m M.D.   On: 10/27/2020 21:31   CT HEAD WO CONTRAST  Result Date: 10/27/2020 CLINICAL DATA:  Slurred speech, unsteady gait EXAM: CT HEAD WITHOUT CONTRAST TECHNIQUE: Contiguous axial images were obtained from the base of the skull through the vertex without intravenous contrast. COMPARISON:  05/14/2010 FINDINGS: Brain: No evidence of acute infarction, hemorrhage, extra-axial  collection, ventriculomegaly, or mass effect. Generalized cerebral atrophy. Periventricular white matter low attenuation likely secondary to microangiopathy. Vascular: Cerebrovascular atherosclerotic calcifications are noted. Skull: Negative for fracture or focal lesion. Sinuses/Orbits: Visualized portions of the orbits are unremarkable. Visualized portions of the paranasal sinuses are unremarkable. Visualized portions of the mastoid air cells are unremarkable. Other: None. IMPRESSION: 1. No acute intracranial pathology. 2. Chronic microvascular disease and cerebral atrophy. Electronically Signed   By: 05/16/2010   On: 10/27/2020 14:26   CT Angio Neck W and/or Wo Contrast  Result Date: 10/27/2020 CLINICAL DATA:  Unsteady  gait with slurred speech EXAM: CT ANGIOGRAPHY HEAD AND NECK TECHNIQUE: Multidetector CT imaging of the head and neck was performed using the standard protocol during bolus administration of intravenous contrast. Multiplanar CT image reconstructions and MIPs were obtained to evaluate the vascular anatomy. Carotid stenosis measurements (when applicable) are obtained utilizing NASCET criteria, using the distal internal carotid diameter as the denominator. CONTRAST:  8mL OMNIPAQUE IOHEXOL 350 MG/ML SOLN COMPARISON:  None. FINDINGS: CTA NECK FINDINGS SKELETON: There is no bony spinal canal stenosis. No lytic or blastic lesion. OTHER NECK: Normal pharynx, larynx and major salivary glands. No cervical lymphadenopathy. Unremarkable thyroid gland. UPPER CHEST: Areas of consolidation in the right upper lobe AORTIC ARCH: There is calcific atherosclerosis of the aortic arch. There is no aneurysm, dissection or hemodynamically significant stenosis of the visualized portion of the aorta. Conventional 3 vessel aortic branching pattern. The visualized proximal subclavian arteries are widely patent. RIGHT CAROTID SYSTEM: Normal without aneurysm, dissection or stenosis. LEFT CAROTID SYSTEM: Normal without  aneurysm, dissection or stenosis. VERTEBRAL ARTERIES: Codominant configuration. Both origins are clearly patent. There is no dissection, occlusion or flow-limiting stenosis to the skull base (V1-V3 segments). CTA HEAD FINDINGS POSTERIOR CIRCULATION: --Vertebral arteries: Normal V4 segments. --Inferior cerebellar arteries: Normal. --Basilar artery: Normal. --Superior cerebellar arteries: Normal. --Posterior cerebral arteries (PCA): Normal. ANTERIOR CIRCULATION: --Intracranial internal carotid arteries: Normal. --Anterior cerebral arteries (ACA): Normal. Both A1 segments are present. Patent anterior communicating artery (a-comm). --Middle cerebral arteries (MCA): Normal. VENOUS SINUSES: As permitted by contrast timing, patent. ANATOMIC VARIANTS: None Review of the MIP images confirms the above findings. IMPRESSION: 1. No emergent large vessel occlusion or high-grade stenosis of the intracranial arteries. 2. Areas of consolidation in the right upper lobe, concerning for pneumonia. Aortic Atherosclerosis (ICD10-I70.0). Electronically Signed   By: Deatra Robinson M.D.   On: 10/27/2020 21:31   CT ANGIO CHEST PE W OR WO CONTRAST  Result Date: 10/27/2020 CLINICAL DATA:  Hypoxia, elevated D-dimer. EXAM: CT ANGIOGRAPHY CHEST WITH CONTRAST TECHNIQUE: Multidetector CT imaging of the chest was performed using the standard protocol during bolus administration of intravenous contrast. Multiplanar CT image reconstructions and MIPs were obtained to evaluate the vascular anatomy. CONTRAST:  32mL OMNIPAQUE IOHEXOL 350 MG/ML SOLN COMPARISON:  None. FINDINGS: Cardiovascular: Satisfactory opacification of the pulmonary arteries to the segmental level. No evidence of pulmonary embolism. Normal heart size. No pericardial effusion. Atherosclerosis of thoracic aorta is noted without aneurysm or dissection. Mediastinum/Nodes: No enlarged mediastinal, hilar, or axillary lymph nodes. Thyroid gland, trachea, and esophagus demonstrate no  significant findings. Lungs/Pleura: No pneumothorax or pleural effusion is noted. Minimal bilateral posterior basilar subsegmental atelectasis is noted. Right upper lobe airspace opacity is noted concerning for pneumonia. Upper Abdomen: No acute abnormality. Musculoskeletal: No chest wall abnormality. No acute or significant osseous findings. Review of the MIP images confirms the above findings. IMPRESSION: 1. No definite evidence of pulmonary embolus. 2. Right upper lobe airspace opacity is noted concerning for pneumonia. 3. Aortic atherosclerosis. Aortic Atherosclerosis (ICD10-I70.0). Electronically Signed   By: Lupita Raider M.D.   On: 10/27/2020 20:47   DG Chest Port 1 View  Result Date: 10/27/2020 CLINICAL DATA:  COVID-19, hypoxia, CVA symptoms, confusion EXAM: PORTABLE CHEST 1 VIEW COMPARISON:  None. FINDINGS: Single frontal view of the chest demonstrates an unremarkable cardiac silhouette. The lung volumes are diminished, with patchy areas of bibasilar consolidation that could reflect atelectasis. There is a wedge-shaped area of ground-glass attenuation in the right lateral midlung zone. Given clinical presentation, aspiration could be  considered, as well as infection. No effusion or pneumothorax. Prominent skin fold overlies the left lateral costophrenic angle. No acute bony abnormalities. IMPRESSION: 1. Wedge-shaped area of ground-glass attenuation within the lateral right midlung zone. Given clinical presentation of confusion and CVA symptoms, aspiration could be considered. Alternatively, this could reflect pneumonia. 2. Low lung volumes with bibasilar atelectasis. Electronically Signed   By: Sharlet Salina M.D.   On: 10/27/2020 17:34    EKG: Independently reviewed.  Sinus tachycardia.  Assessment/Plan Principal Problem:   Acute respiratory failure due to COVID-19 [facility]) Active Problems:   Alzheimer's type dementia (HCC)   Myasthenia gravis (HCC)   Acute encephalopathy    1. Pneumonia  likely a combination of possible committee acquired and Covid pneumonia for which I have placed patient on empiric antibiotics and discussed with pharmacy about medication that would be safe for myasthenia gravis and place patient on ceftriaxone and doxycycline.  We will continue on remdesivir and steroids as recommended by pulmonary critical care.  Follow including markers and respiratory status.  Get swallow evaluation until then patient will be n.p.o. 2. Acute encephalopathy likely from pneumonia.  CT angiogram of the head and neck is unremarkable discussed with on-call neurologist Dr. Jerrell Belfast who felt patient symptoms are likely from metabolic issues and infectious issues per MRI brain is pending. 3. Myasthenia gravis presently patient is on IV steroids for pneumonia secondary to Covid.  Once patient passes swallow evaluation restart patient's Mestinon.  Since patient cannot cooperate for NIF and vital capacity there is any concern for myasthenia gravis exacerbation will need ABG to check for PCO2.  Discussed with neurologist. 4. History of Alzheimer's dementia on Namenda which can be restarted after patient passes swallow.  Since patient has pneumonia and encephalopathy with Covid infection will need close monitoring for any further worsening in patient status.   DVT prophylaxis: Lovenox. Code Status: Full code. Family Communication: We will need to discuss with patient's daughter. Disposition Plan: To be determined. Consults called: Pulmonary critical care and swallow evaluation. Admission status: Inpatient.   Eduard Clos MD [facility] Pager [phone].  If 7PM-7AM, please contact night-coverage [email or web address] Password [email or web address]  10/27/2020, 10:45 PM

## 2020-10-27 NOTE — ED Notes (Signed)
Patient transported to MRI 

## 2020-10-28 DIAGNOSIS — J9600 Acute respiratory failure, unspecified whether with hypoxia or hypercapnia: Secondary | ICD-10-CM | POA: Diagnosis not present

## 2020-10-28 DIAGNOSIS — U071 COVID-19: Secondary | ICD-10-CM | POA: Diagnosis not present

## 2020-10-28 LAB — CBC WITH DIFFERENTIAL/PLATELET
Abs Immature Granulocytes: 0.03 10*3/uL (ref 0.00–0.07)
Basophils Absolute: 0 10*3/uL (ref 0.0–0.1)
Basophils Relative: 0 %
Eosinophils Absolute: 0 10*3/uL (ref 0.0–0.5)
Eosinophils Relative: 0 %
HCT: 48.9 % — ABNORMAL HIGH (ref 36.0–46.0)
Hemoglobin: 15.3 g/dL — ABNORMAL HIGH (ref 12.0–15.0)
Immature Granulocytes: 1 %
Lymphocytes Relative: 11 %
Lymphs Abs: 0.5 10*3/uL — ABNORMAL LOW (ref 0.7–4.0)
MCH: 28.8 pg (ref 26.0–34.0)
MCHC: 31.3 g/dL (ref 30.0–36.0)
MCV: 92.1 fL (ref 80.0–100.0)
Monocytes Absolute: 0.2 10*3/uL (ref 0.1–1.0)
Monocytes Relative: 4 %
Neutro Abs: 4.1 10*3/uL (ref 1.7–7.7)
Neutrophils Relative %: 84 %
Platelets: 138 10*3/uL — ABNORMAL LOW (ref 150–400)
RBC: 5.31 MIL/uL — ABNORMAL HIGH (ref 3.87–5.11)
RDW: 13.2 % (ref 11.5–15.5)
WBC: 4.9 10*3/uL (ref 4.0–10.5)
nRBC: 0 % (ref 0.0–0.2)

## 2020-10-28 LAB — COMPREHENSIVE METABOLIC PANEL
ALT: 23 U/L (ref 0–44)
AST: 30 U/L (ref 15–41)
Albumin: 3.3 g/dL — ABNORMAL LOW (ref 3.5–5.0)
Alkaline Phosphatase: 59 U/L (ref 38–126)
Anion gap: 13 (ref 5–15)
BUN: 20 mg/dL (ref 8–23)
CO2: 22 mmol/L (ref 22–32)
Calcium: 8.2 mg/dL — ABNORMAL LOW (ref 8.9–10.3)
Chloride: 105 mmol/L (ref 98–111)
Creatinine, Ser: 1.24 mg/dL — ABNORMAL HIGH (ref 0.44–1.00)
GFR, Estimated: 43 mL/min — ABNORMAL LOW (ref >60)
Glucose, Bld: 138 mg/dL — ABNORMAL HIGH (ref 70–99)
Potassium: 4.2 mmol/L (ref 3.5–5.1)
Sodium: 140 mmol/L (ref 135–145)
Total Bilirubin: 0.8 mg/dL (ref 0.3–1.2)
Total Protein: 6 g/dL — ABNORMAL LOW (ref 6.5–8.1)

## 2020-10-28 LAB — URINALYSIS, ROUTINE W REFLEX MICROSCOPIC
Bilirubin Urine: NEGATIVE
Glucose, UA: NEGATIVE mg/dL
Ketones, ur: NEGATIVE mg/dL
Leukocytes,Ua: NEGATIVE
Nitrite: POSITIVE — AB
Protein, ur: 30 mg/dL — AB
Specific Gravity, Urine: 1.01 (ref 1.005–1.030)
pH: 7.5 (ref 5.0–8.0)

## 2020-10-28 LAB — PROCALCITONIN
Procalcitonin: 0.75 ng/mL
Procalcitonin: 0.65 ng/mL

## 2020-10-28 LAB — URINALYSIS, MICROSCOPIC (REFLEX): RBC / HPF: NONE SEEN RBC/hpf (ref 0–5)

## 2020-10-28 LAB — CREATININE, SERUM
Creatinine, Ser: 1.21 mg/dL — ABNORMAL HIGH (ref 0.44–1.00)
GFR, Estimated: 45 mL/min — ABNORMAL LOW (ref >60)

## 2020-10-28 LAB — I-STAT ARTERIAL BLOOD GAS, ED
Acid-Base Excess: 0 mmol/L (ref 0.0–2.0)
Bicarbonate: 24.7 mmol/L (ref 20.0–28.0)
Calcium, Ion: 1.17 mmol/L (ref 1.15–1.40)
HCT: 39 % (ref 36.0–46.0)
Hemoglobin: 13.3 g/dL (ref 12.0–15.0)
O2 Saturation: 91 %
Potassium: 3.7 mmol/L (ref 3.5–5.1)
Sodium: 142 mmol/L (ref 135–145)
TCO2: 26 mmol/L (ref 22–32)
pCO2 arterial: 41.7 mmHg (ref 32.0–48.0)
pH, Arterial: 7.381 (ref 7.350–7.450)
pO2, Arterial: 63 mmHg — ABNORMAL LOW (ref 83.0–108.0)

## 2020-10-28 LAB — CBC
HCT: 44.7 % (ref 36.0–46.0)
Hemoglobin: 14.1 g/dL (ref 12.0–15.0)
MCH: 28.9 pg (ref 26.0–34.0)
MCHC: 31.5 g/dL (ref 30.0–36.0)
MCV: 91.6 fL (ref 80.0–100.0)
Platelets: 126 10*3/uL — ABNORMAL LOW (ref 150–400)
RBC: 4.88 MIL/uL (ref 3.87–5.11)
RDW: 13.5 % (ref 11.5–15.5)
WBC: 6.6 10*3/uL (ref 4.0–10.5)
nRBC: 0 % (ref 0.0–0.2)

## 2020-10-28 LAB — CBG MONITORING, ED
Glucose-Capillary: 112 mg/dL — ABNORMAL HIGH (ref 70–99)
Glucose-Capillary: 118 mg/dL — ABNORMAL HIGH (ref 70–99)
Glucose-Capillary: 142 mg/dL — ABNORMAL HIGH (ref 70–99)
Glucose-Capillary: 96 mg/dL (ref 70–99)

## 2020-10-28 LAB — GLUCOSE, CAPILLARY: Glucose-Capillary: 118 mg/dL — ABNORMAL HIGH (ref 70–99)

## 2020-10-28 LAB — C-REACTIVE PROTEIN: CRP: 9.5 mg/dL — ABNORMAL HIGH (ref <1.0)

## 2020-10-28 LAB — D-DIMER, QUANTITATIVE: D-Dimer, Quant: 1.48 ug/mL-FEU — ABNORMAL HIGH (ref 0.00–0.50)

## 2020-10-28 MED ORDER — PYRIDOSTIGMINE BROMIDE 60 MG PO TABS
60.0000 mg | ORAL_TABLET | Freq: Three times a day (TID) | ORAL | Status: DC
  Administered 2020-10-28 – 2020-11-12 (×35): 60 mg via ORAL
  Filled 2020-10-28 (×50): qty 1

## 2020-10-28 MED ORDER — MEMANTINE HCL 10 MG PO TABS
10.0000 mg | ORAL_TABLET | Freq: Two times a day (BID) | ORAL | Status: DC
  Administered 2020-10-28 – 2020-11-12 (×25): 10 mg via ORAL
  Filled 2020-10-28 (×26): qty 1

## 2020-10-28 MED ORDER — PANTOPRAZOLE SODIUM 40 MG PO TBEC
40.0000 mg | DELAYED_RELEASE_TABLET | Freq: Every day | ORAL | Status: DC
  Administered 2020-10-28 – 2020-11-12 (×13): 40 mg via ORAL
  Filled 2020-10-28 (×13): qty 1

## 2020-10-28 MED ORDER — QUETIAPINE FUMARATE 25 MG PO TABS
12.5000 mg | ORAL_TABLET | Freq: Every day | ORAL | Status: DC
Start: 2020-10-28 — End: 2020-10-29
  Administered 2020-10-28: 12.5 mg via ORAL
  Filled 2020-10-28 (×2): qty 1

## 2020-10-28 MED ORDER — MIRTAZAPINE 15 MG PO TABS
7.5000 mg | ORAL_TABLET | Freq: Every day | ORAL | Status: DC
  Administered 2020-10-28 – 2020-11-11 (×12): 7.5 mg via ORAL
  Filled 2020-10-28 (×13): qty 1

## 2020-10-28 MED ORDER — HALOPERIDOL LACTATE 5 MG/ML IJ SOLN
5.0000 mg | Freq: Four times a day (QID) | INTRAMUSCULAR | Status: DC | PRN
  Administered 2020-10-28 (×2): 5 mg via INTRAVENOUS
  Filled 2020-10-28 (×2): qty 1

## 2020-10-28 NOTE — ED Notes (Signed)
Please call daughter Satira Mccallum @ [phone] for a status update--nurse was unavailable

## 2020-10-28 NOTE — ED Notes (Signed)
Patient CBG was 118. 

## 2020-10-28 NOTE — ED Notes (Signed)
Pt continues to pull off monitoring devices and attempt to pull out IV access. Pt also pulls off nasal cannula. Pt continues to scoot to end of bed in attempt to get up. Efforts and redirection unsuccessful. Mittens placed on pt for pt safety. MD notified and safety sitter order placed. Called for sitter but no sitter available. Sitter from room 39 now sitting with pt.

## 2020-10-28 NOTE — ED Notes (Signed)
Pt continues to try to get out of bed, pull off mittens, remove monitoring devices, sitter remains at bedside and is needed to continually redirect pt for pt safety

## 2020-10-28 NOTE — Evaluation (Signed)
Clinical/Bedside Swallow Evaluation Patient Details  Name: [name] [name] MRN: [id number] Date of Birth: [birth year]  Today's Date: 10/28/2020 Time: SLP Start Time (ACUTE ONLY): 1140 SLP Stop Time (ACUTE ONLY): 1150 SLP Time Calculation (min) (ACUTE ONLY): 10 min  Past Medical History:  Past Medical History:  Diagnosis Date  . Achilles tendonitis 10/28/2012  . Alzheimer's type dementia (HCC) 06/10/2013  . Anxiety   . Depression   . Fibromyalgia   . GERD (gastroesophageal reflux disease)   . Memory loss   . Myasthenia gravis   . Nocardia infection    h/o brain abscess  . Osteoporosis    Past Surgical History:  Past Surgical History:  Procedure Laterality Date  . ABDOMINAL HYSTERECTOMY    . APPENDECTOMY    . BLADDER REPAIR    . BREAST BIOPSY    . CHOLECYSTECTOMY    . TONSILLECTOMY AND ADENOIDECTOMY     HPI:  [name] [name] is a 84 y.o. female with known history of dementia and ocular myasthenia gravis was found to be increasingly confused by patient's daughter and was brought to the ER.  Per the report patient usually is ambulatory and able to dress herself. chest x-ray showing wedge-shaped defect and Covid test was positive. Mestinon being held until pt cleared for oral intake. Per chart, pt has always denied difficulty swallowing and has never needed a swallow evaluation.   Assessment / Plan / Recommendation Clinical Impression  Pt demosntrates no signs of dysphagia or aspiration. Neurology notes consistently state that pt has never suffered from dysphagia despite MG dx. Will initiate a diet and sign off. SLP Visit Diagnosis: Dysphagia, unspecified (R13.10)    Aspiration Risk  No limitations    Diet Recommendation Regular;Thin liquid   Liquid Administration via: Cup;Straw Medication Administration: Whole meds with liquid Supervision: Staff to assist with self feeding Postural Changes: Seated upright at 90 degrees    Other  Recommendations     Follow up Recommendations         Frequency and Duration            Prognosis        Swallow Study   General HPI: [name] [name] is a 84 y.o. female with known history of dementia and ocular myasthenia gravis was found to be increasingly confused by patient's daughter and was brought to the ER.  Per the report patient usually is ambulatory and able to dress herself. chest x-ray showing wedge-shaped defect and Covid test was positive. Mestinon being held until pt cleared for oral intake. Per chart, pt has always denied difficulty swallowing and has never needed a swallow evaluation. Type of Study: Bedside Swallow Evaluation Previous Swallow Assessment: none in chart Diet Prior to this Study: NPO Temperature Spikes Noted: No Respiratory Status: Room air History of Recent Intubation: No Behavior/Cognition: Alert;Cooperative;Pleasant mood Oral Cavity Assessment: Within Functional Limits Oral Care Completed by SLP: No Oral Cavity - Dentition: Adequate natural dentition Vision: Functional for self-feeding Self-Feeding Abilities: Able to feed self Patient Positioning: Upright in bed Baseline Vocal Quality: Normal Volitional Cough: Strong Volitional Swallow: Able to elicit    Oral/Motor/Sensory Function     Ice Chips     Thin Liquid Thin Liquid: Within functional limits Presentation: Straw    Nectar Thick Nectar Thick Liquid: Not tested   Honey Thick Honey Thick Liquid: Not tested   Puree Puree: Within functional limits   Solid     Solid: Within functional limits     Harlon Ditty, MA  CCC-SLP  [facility] Services Pager [phone] Office [phone]  Claudine Mouton 10/28/2020,12:02 PM

## 2020-10-28 NOTE — Progress Notes (Signed)
PROGRESS NOTE    LANGLEY FLATLEY  BZJ:[id number] DOB: [birth year] DOA: 10/27/2020 PCP: Tonia Ghent, MD   Brief Narrative:  HPI: [name] [name] is a 84 y.o. female with known history of dementia and ocular myasthenia gravis was found to be increasingly confused by patient's daughter and was brought to the ER.  Per the report patient usually is ambulatory and able to dress herself.  ED Course: In the ER patient appears lethargic and labs reveal creatinine 1.2 CBC largely unremarkable with chest x-ray showing wedge-shaped defect and Covid test was positive.  Critical care was consulted and at this time they felt that patient was stable enough to be admitted to the floor under hospitalist.  Subsequent which patient underwent CT angiogram of the head and neck which does not show anything acute except for possible pneumonia and CT angiogram of the chest was negative for pulmonary embolism but did show consolidation concerning for pneumonia.  Patient was started on empiric antibiotics for community-acquired pneumonia and also Covid pneumonia.   Assessment & Plan:   Principal Problem:   Acute respiratory failure due to COVID-19 [facility]) Active Problems:   Alzheimer's type dementia ([place])   Myasthenia gravis ([place])   Acute encephalopathy   Acute hypoxic respiratory failure and sepsis secondary to COVID-19 pneumonia with superimposed possible bacterial pneumonia: Patient met sepsis criteria based on the source of infection/pneumonia with tachycardia and tachypnea.  Currently requiring only 2 L of oxygen.  Continue Rocephin, doxycycline, remdesivir and Solu-Medrol.  To be noted that initial chest x-ray showed wedge-shaped area of groundglass attenuation on the lungs.  CT angiogram of the chest ruled out PE.  Patient was evaluated by PCCM. Patient was encouraged to prone, out of bed to chair, to use incentive spirometry and flutter valve.  Acute toxic encephalopathy: Likely secondary to sepsis and  infection.  Patient was earlier alert and pleasantly confused.  I just received a message from patient's nurse that she has pulled her IV lines and is trying to get out of bed.  She will be placed in hand mittens and will place her on as needed Haldol as she is trying to get out of bed and at risk of fall.  MRI, CT angiogram of head and neck all are negative for any acute pathology.  Case was discussed with neurology by admitting hospitalist.  History of myasthenia gravis: Patient is able to open both eyes without any trouble.  She does not seem to have any respiratory trouble indicating acute exacerbation of myasthenia gravis.  She was also evaluated by PCCM yesterday.  Continue NIF.  Resume pyridostigmine.  History of Alzheimer's dementia: We will resume Namenda.  DVT prophylaxis: enoxaparin (LOVENOX) injection 40 mg Start: 10/28/20 1000   Code Status: Full Code  Family Communication: None present at bedside.  Will call family later.  Status is: Inpatient  Remains inpatient appropriate because:Inpatient level of care appropriate due to severity of illness   Dispo: The patient is from: Home              Anticipated d/c is to: Home              Anticipated d/c date is: 2 days              Patient currently is not medically stable to d/c.        Estimated body mass index is 28.19 kg/m as calculated from the following:   Height as of 05/13/19: $RemoveBef'5\' 7"'[id number]$  (1.702 m).  Weight as of 07/23/20: 81.6 kg.      Nutritional status:               Consultants:   None  Procedures:   None  Antimicrobials:  Anti-infectives (From admission, onward)   Start     Dose/Rate Route Frequency Ordered Stop   10/28/20 1000  remdesivir 100 mg in sodium chloride 0.9 % 100 mL IVPB       "Followed by" Linked Group Details   100 mg 200 mL/hr over 30 Minutes Intravenous Daily 10/27/20 2244 11/01/20 0959   10/27/20 2245  remdesivir 200 mg in sodium chloride 0.9% 250 mL IVPB       "Followed by"  Linked Group Details   200 mg 580 mL/hr over 30 Minutes Intravenous Once 10/27/20 2244 10/28/20 0107   10/27/20 2245  cefTRIAXone (ROCEPHIN) 2 g in sodium chloride 0.9 % 100 mL IVPB        2 g 200 mL/hr over 30 Minutes Intravenous Every 24 hours 10/27/20 2244     10/27/20 2245  doxycycline (VIBRAMYCIN) 100 mg in sodium chloride 0.9 % 250 mL IVPB        100 mg 125 mL/hr over 120 Minutes Intravenous Every 12 hours 10/27/20 2244           Subjective: Patient seen and examined earlier.  She was alert and awake but pleasantly confused.  She had no complaint and looked comfortable.  Objective: Vitals:   10/28/20 0530 10/28/20 0615 10/28/20 0645 10/28/20 0930  BP:   109/80 132/83  Pulse: 92  80 68  Resp: (!) 29  (!) 31 17  Temp:  98.1 F (36.7 C)    TempSrc:  Oral    SpO2: (!) 87%  92% 93%   No intake or output data in the 24 hours ending 10/28/20 1115 There were no vitals filed for this visit.  Examination:  General exam: Appears calm and comfortable  Respiratory system: Diminished breath sounds in the right middle and lower lobe and left lower lobe but clear otherwise. Respiratory effort normal. Cardiovascular system: S1 & S2 heard, RRR. No JVD, murmurs, rubs, gallops or clicks. No pedal edema. Gastrointestinal system: Abdomen is nondistended, soft and nontender. No organomegaly or masses felt. Normal bowel sounds heard. Central nervous system: Alert but pleasantly confused. No focal neurological deficits. Extremities: Symmetric 5 x 5 power. Skin: No rashes, lesions or ulcers    Data Reviewed: I have personally reviewed following labs and imaging studies  CBC: Recent Labs  Lab 10/27/20 1418 10/27/20 1449 10/27/20 1733 10/28/20 0101 10/28/20 0500 10/28/20 0901  WBC 7.4  --   --  6.6 4.9  --   NEUTROABS 6.0  --   --   --  4.1  --   HGB 14.4 15.3* 14.3 14.1 15.3* 13.3  HCT 45.7 45.0 42.0 44.7 48.9* 39.0  MCV 92.0  --   --  91.6 92.1  --   PLT 171  --   --  126* 138*   --    Basic Metabolic Panel: Recent Labs  Lab 10/27/20 1418 10/27/20 1449 10/27/20 1733 10/28/20 0101 10/28/20 0500 10/28/20 0901  NA 138 139 138  --  140 142  K 3.9 4.1 3.9  --  4.2 3.7  CL 101 105  --   --  105  --   CO2 25  --   --   --  22  --   GLUCOSE 117* 106*  --   --  138*  --   BUN 21 29*  --   --  20  --   CREATININE 1.24* 1.30*  --  1.21* 1.24*  --   CALCIUM 8.7*  --   --   --  8.2*  --    GFR: CrCl cannot be calculated (Unknown ideal weight.). Liver Function Tests: Recent Labs  Lab 10/27/20 1418 10/28/20 0500  AST 24 30  ALT 22 23  ALKPHOS 64 59  BILITOT 0.8 0.8  PROT 6.2* 6.0*  ALBUMIN 3.4* 3.3*   No results for input(s): LIPASE, AMYLASE in the last 168 hours. No results for input(s): AMMONIA in the last 168 hours. Coagulation Profile: Recent Labs  Lab 10/27/20 1505  INR 1.0   Cardiac Enzymes: No results for input(s): CKTOTAL, CKMB, CKMBINDEX, TROPONINI in the last 168 hours. BNP (last 3 results) No results for input(s): PROBNP in the last 8760 hours. HbA1C: No results for input(s): HGBA1C in the last 72 hours. CBG: Recent Labs  Lab 10/28/20 0014 10/28/20 0613  GLUCAP 96 142*   Lipid Profile: Recent Labs    10/27/20 1700  TRIG 83   Thyroid Function Tests: No results for input(s): TSH, T4TOTAL, FREET4, T3FREE, THYROIDAB in the last 72 hours. Anemia Panel: Recent Labs    10/27/20 1700  FERRITIN 804*   Sepsis Labs: Recent Labs  Lab 10/27/20 1605 10/27/20 1700 10/28/20 0101 10/28/20 0500  PROCALCITON  --  0.54 0.75 0.65  LATICACIDVEN 1.5  --   --   --     Recent Results (from the past 240 hour(s))  Culture, blood (Routine x 2)     Status: None (Preliminary result)   Collection Time: 10/27/20  4:05 PM   Specimen: BLOOD  Result Value Ref Range Status   Specimen Description BLOOD LEFT UPPER ARM  Final   Special Requests   Final    BOTTLES DRAWN AEROBIC AND ANAEROBIC Blood Culture adequate volume   Culture   Final    NO  GROWTH < 12 HOURS Performed at [facility], [place] [address], [place], [place] [address]    Report Status PENDING  Incomplete      Radiology Studies: CT Angio Head W or Wo Contrast  Result Date: 10/27/2020 CLINICAL DATA:  Unsteady gait with slurred speech EXAM: CT ANGIOGRAPHY HEAD AND NECK TECHNIQUE: Multidetector CT imaging of the head and neck was performed using the standard protocol during bolus administration of intravenous contrast. Multiplanar CT image reconstructions and MIPs were obtained to evaluate the vascular anatomy. Carotid stenosis measurements (when applicable) are obtained utilizing NASCET criteria, using the distal internal carotid diameter as the denominator. CONTRAST:  39m OMNIPAQUE IOHEXOL 350 MG/ML SOLN COMPARISON:  None. FINDINGS: CTA NECK FINDINGS SKELETON: There is no bony spinal canal stenosis. No lytic or blastic lesion. OTHER NECK: Normal pharynx, larynx and major salivary glands. No cervical lymphadenopathy. Unremarkable thyroid gland. UPPER CHEST: Areas of consolidation in the right upper lobe AORTIC ARCH: There is calcific atherosclerosis of the aortic arch. There is no aneurysm, dissection or hemodynamically significant stenosis of the visualized portion of the aorta. Conventional 3 vessel aortic branching pattern. The visualized proximal subclavian arteries are widely patent. RIGHT CAROTID SYSTEM: Normal without aneurysm, dissection or stenosis. LEFT CAROTID SYSTEM: Normal without aneurysm, dissection or stenosis. VERTEBRAL ARTERIES: Codominant configuration. Both origins are clearly patent. There is no dissection, occlusion or flow-limiting stenosis to the skull base (V1-V3 segments). CTA HEAD FINDINGS POSTERIOR CIRCULATION: --Vertebral arteries: Normal V4 segments. --Inferior cerebellar arteries:  Normal. --Basilar artery: Normal. --Superior cerebellar arteries: Normal. --Posterior cerebral arteries (PCA): Normal. ANTERIOR CIRCULATION: --Intracranial internal  carotid arteries: Normal. --Anterior cerebral arteries (ACA): Normal. Both A1 segments are present. Patent anterior communicating artery (a-comm). --Middle cerebral arteries (MCA): Normal. VENOUS SINUSES: As permitted by contrast timing, patent. ANATOMIC VARIANTS: None Review of the MIP images confirms the above findings. IMPRESSION: 1. No emergent large vessel occlusion or high-grade stenosis of the intracranial arteries. 2. Areas of consolidation in the right upper lobe, concerning for pneumonia. Aortic Atherosclerosis (ICD10-I70.0). Electronically Signed   By: Deatra Robinson M.D.   On: 10/27/2020 21:31   CT HEAD WO CONTRAST  Result Date: 10/27/2020 CLINICAL DATA:  Slurred speech, unsteady gait EXAM: CT HEAD WITHOUT CONTRAST TECHNIQUE: Contiguous axial images were obtained from the base of the skull through the vertex without intravenous contrast. COMPARISON:  05/14/2010 FINDINGS: Brain: No evidence of acute infarction, hemorrhage, extra-axial collection, ventriculomegaly, or mass effect. Generalized cerebral atrophy. Periventricular white matter low attenuation likely secondary to microangiopathy. Vascular: Cerebrovascular atherosclerotic calcifications are noted. Skull: Negative for fracture or focal lesion. Sinuses/Orbits: Visualized portions of the orbits are unremarkable. Visualized portions of the paranasal sinuses are unremarkable. Visualized portions of the mastoid air cells are unremarkable. Other: None. IMPRESSION: 1. No acute intracranial pathology. 2. Chronic microvascular disease and cerebral atrophy. Electronically Signed   By: Elige Ko   On: 10/27/2020 14:26   CT Angio Neck W and/or Wo Contrast  Result Date: 10/27/2020 CLINICAL DATA:  Unsteady gait with slurred speech EXAM: CT ANGIOGRAPHY HEAD AND NECK TECHNIQUE: Multidetector CT imaging of the head and neck was performed using the standard protocol during bolus administration of intravenous contrast. Multiplanar CT image  reconstructions and MIPs were obtained to evaluate the vascular anatomy. Carotid stenosis measurements (when applicable) are obtained utilizing NASCET criteria, using the distal internal carotid diameter as the denominator. CONTRAST:  24mL OMNIPAQUE IOHEXOL 350 MG/ML SOLN COMPARISON:  None. FINDINGS: CTA NECK FINDINGS SKELETON: There is no bony spinal canal stenosis. No lytic or blastic lesion. OTHER NECK: Normal pharynx, larynx and major salivary glands. No cervical lymphadenopathy. Unremarkable thyroid gland. UPPER CHEST: Areas of consolidation in the right upper lobe AORTIC ARCH: There is calcific atherosclerosis of the aortic arch. There is no aneurysm, dissection or hemodynamically significant stenosis of the visualized portion of the aorta. Conventional 3 vessel aortic branching pattern. The visualized proximal subclavian arteries are widely patent. RIGHT CAROTID SYSTEM: Normal without aneurysm, dissection or stenosis. LEFT CAROTID SYSTEM: Normal without aneurysm, dissection or stenosis. VERTEBRAL ARTERIES: Codominant configuration. Both origins are clearly patent. There is no dissection, occlusion or flow-limiting stenosis to the skull base (V1-V3 segments). CTA HEAD FINDINGS POSTERIOR CIRCULATION: --Vertebral arteries: Normal V4 segments. --Inferior cerebellar arteries: Normal. --Basilar artery: Normal. --Superior cerebellar arteries: Normal. --Posterior cerebral arteries (PCA): Normal. ANTERIOR CIRCULATION: --Intracranial internal carotid arteries: Normal. --Anterior cerebral arteries (ACA): Normal. Both A1 segments are present. Patent anterior communicating artery (a-comm). --Middle cerebral arteries (MCA): Normal. VENOUS SINUSES: As permitted by contrast timing, patent. ANATOMIC VARIANTS: None Review of the MIP images confirms the above findings. IMPRESSION: 1. No emergent large vessel occlusion or high-grade stenosis of the intracranial arteries. 2. Areas of consolidation in the right upper lobe,  concerning for pneumonia. Aortic Atherosclerosis (ICD10-I70.0). Electronically Signed   By: Deatra Robinson M.D.   On: 10/27/2020 21:31   CT ANGIO CHEST PE W OR WO CONTRAST  Result Date: 10/27/2020 CLINICAL DATA:  Hypoxia, elevated D-dimer. EXAM: CT ANGIOGRAPHY CHEST WITH CONTRAST TECHNIQUE: Multidetector CT  imaging of the chest was performed using the standard protocol during bolus administration of intravenous contrast. Multiplanar CT image reconstructions and MIPs were obtained to evaluate the vascular anatomy. CONTRAST:  57m OMNIPAQUE IOHEXOL 350 MG/ML SOLN COMPARISON:  None. FINDINGS: Cardiovascular: Satisfactory opacification of the pulmonary arteries to the segmental level. No evidence of pulmonary embolism. Normal heart size. No pericardial effusion. Atherosclerosis of thoracic aorta is noted without aneurysm or dissection. Mediastinum/Nodes: No enlarged mediastinal, hilar, or axillary lymph nodes. Thyroid gland, trachea, and esophagus demonstrate no significant findings. Lungs/Pleura: No pneumothorax or pleural effusion is noted. Minimal bilateral posterior basilar subsegmental atelectasis is noted. Right upper lobe airspace opacity is noted concerning for pneumonia. Upper Abdomen: No acute abnormality. Musculoskeletal: No chest wall abnormality. No acute or significant osseous findings. Review of the MIP images confirms the above findings. IMPRESSION: 1. No definite evidence of pulmonary embolus. 2. Right upper lobe airspace opacity is noted concerning for pneumonia. 3. Aortic atherosclerosis. Aortic Atherosclerosis (ICD10-I70.0). Electronically Signed   By: JMarijo ConceptionM.D.   On: 10/27/2020 20:47   MR BRAIN WO CONTRAST  Result Date: 10/28/2020 CLINICAL DATA:  Slurred speech and encephalopathy EXAM: MRI HEAD WITHOUT CONTRAST TECHNIQUE: Multiplanar, multiecho pulse sequences of the brain and surrounding structures were obtained without intravenous contrast. COMPARISON:  None. FINDINGS: Brain:  No acute infarct, mass effect or extra-axial collection. No acute or chronic hemorrhage. There is multifocal hyperintense T2-weighted signal within the white matter. Generalized volume loss without a clear lobar predilection. The midline structures are normal. Vascular: Major flow voids are preserved. Skull and upper cervical spine: Normal calvarium and skull base. Visualized upper cervical spine and soft tissues are normal. Sinuses/Orbits:No paranasal sinus fluid levels or advanced mucosal thickening. No mastoid or middle ear effusion. Normal orbits. IMPRESSION: 1. No acute intracranial abnormality. 2. Generalized volume loss and findings of chronic small vessel disease. Electronically Signed   By: KUlyses JarredM.D.   On: 10/28/2020 00:09   DG Chest Port 1 View  Result Date: 10/27/2020 CLINICAL DATA:  COVID-19, hypoxia, CVA symptoms, confusion EXAM: PORTABLE CHEST 1 VIEW COMPARISON:  None. FINDINGS: Single frontal view of the chest demonstrates an unremarkable cardiac silhouette. The lung volumes are diminished, with patchy areas of bibasilar consolidation that could reflect atelectasis. There is a wedge-shaped area of ground-glass attenuation in the right lateral midlung zone. Given clinical presentation, aspiration could be considered, as well as infection. No effusion or pneumothorax. Prominent skin fold overlies the left lateral costophrenic angle. No acute bony abnormalities. IMPRESSION: 1. Wedge-shaped area of ground-glass attenuation within the lateral right midlung zone. Given clinical presentation of confusion and CVA symptoms, aspiration could be considered. Alternatively, this could reflect pneumonia. 2. Low lung volumes with bibasilar atelectasis. Electronically Signed   By: MRanda NgoM.D.   On: 10/27/2020 17:34    Scheduled Meds: . enoxaparin (LOVENOX) injection  40 mg Subcutaneous Daily  . methylPREDNISolone (SOLU-MEDROL) injection  40 mg Intravenous BID   Followed by  . [START ON  10/31/2020] predniSONE  50 mg Oral Daily   Continuous Infusions: . cefTRIAXone (ROCEPHIN)  IV Stopped (10/28/20 0111)  . doxycycline (VIBRAMYCIN) IV Stopped (10/28/20 1038)  . remdesivir 100 mg in NS 100 mL 100 mg (10/28/20 1046)     LOS: 1 day   Time spent: 367minutes   RDarliss Cheney MD [facility]  10/28/2020, 11:15 AM   To contact the attending provider between 7A-7P or the covering provider during after hours 7P-7A, please log into the  web site [email or web address].

## 2020-10-28 NOTE — ED Notes (Signed)
Attempted to call report

## 2020-10-28 NOTE — ED Notes (Signed)
Family updated.

## 2020-10-28 NOTE — ED Notes (Signed)
Sitter at bedside for pt safety. Pt requires constant redirection as she continually attempts to get out of bed, remove mittens and removed monitoring devices.

## 2020-10-29 DIAGNOSIS — G309 Alzheimer's disease, unspecified: Secondary | ICD-10-CM | POA: Diagnosis not present

## 2020-10-29 DIAGNOSIS — G9340 Encephalopathy, unspecified: Secondary | ICD-10-CM | POA: Diagnosis not present

## 2020-10-29 DIAGNOSIS — G7000 Myasthenia gravis without (acute) exacerbation: Secondary | ICD-10-CM | POA: Diagnosis not present

## 2020-10-29 DIAGNOSIS — U071 COVID-19: Secondary | ICD-10-CM | POA: Diagnosis not present

## 2020-10-29 LAB — CBC WITH DIFFERENTIAL/PLATELET
Abs Immature Granulocytes: 0.02 10*3/uL (ref 0.00–0.07)
Basophils Absolute: 0 10*3/uL (ref 0.0–0.1)
Basophils Relative: 0 %
Eosinophils Absolute: 0 10*3/uL (ref 0.0–0.5)
Eosinophils Relative: 0 %
HCT: 43.7 % (ref 36.0–46.0)
Hemoglobin: 14.1 g/dL (ref 12.0–15.0)
Immature Granulocytes: 0 %
Lymphocytes Relative: 8 %
Lymphs Abs: 0.5 10*3/uL — ABNORMAL LOW (ref 0.7–4.0)
MCH: 28.6 pg (ref 26.0–34.0)
MCHC: 32.3 g/dL (ref 30.0–36.0)
MCV: 88.6 fL (ref 80.0–100.0)
Monocytes Absolute: 0.3 10*3/uL (ref 0.1–1.0)
Monocytes Relative: 5 %
Neutro Abs: 5.7 10*3/uL (ref 1.7–7.7)
Neutrophils Relative %: 87 %
Platelets: 175 10*3/uL (ref 150–400)
RBC: 4.93 MIL/uL (ref 3.87–5.11)
RDW: 12.9 % (ref 11.5–15.5)
WBC: 6.6 10*3/uL (ref 4.0–10.5)
nRBC: 0 % (ref 0.0–0.2)

## 2020-10-29 LAB — COMPREHENSIVE METABOLIC PANEL
ALT: 24 U/L (ref 0–44)
AST: 38 U/L (ref 15–41)
Albumin: 3.1 g/dL — ABNORMAL LOW (ref 3.5–5.0)
Alkaline Phosphatase: 62 U/L (ref 38–126)
Anion gap: 14 (ref 5–15)
BUN: 24 mg/dL — ABNORMAL HIGH (ref 8–23)
CO2: 20 mmol/L — ABNORMAL LOW (ref 22–32)
Calcium: 8.3 mg/dL — ABNORMAL LOW (ref 8.9–10.3)
Chloride: 108 mmol/L (ref 98–111)
Creatinine, Ser: 1.26 mg/dL — ABNORMAL HIGH (ref 0.44–1.00)
GFR, Estimated: 43 mL/min — ABNORMAL LOW (ref >60)
Glucose, Bld: 144 mg/dL — ABNORMAL HIGH (ref 70–99)
Potassium: 4 mmol/L (ref 3.5–5.1)
Sodium: 142 mmol/L (ref 135–145)
Total Bilirubin: 0.5 mg/dL (ref 0.3–1.2)
Total Protein: 5.9 g/dL — ABNORMAL LOW (ref 6.5–8.1)

## 2020-10-29 LAB — GLUCOSE, CAPILLARY
Glucose-Capillary: 105 mg/dL — ABNORMAL HIGH (ref 70–99)
Glucose-Capillary: 113 mg/dL — ABNORMAL HIGH (ref 70–99)
Glucose-Capillary: 131 mg/dL — ABNORMAL HIGH (ref 70–99)
Glucose-Capillary: 133 mg/dL — ABNORMAL HIGH (ref 70–99)
Glucose-Capillary: 144 mg/dL — ABNORMAL HIGH (ref 70–99)
Glucose-Capillary: 148 mg/dL — ABNORMAL HIGH (ref 70–99)
Glucose-Capillary: 171 mg/dL — ABNORMAL HIGH (ref 70–99)

## 2020-10-29 LAB — PROCALCITONIN: Procalcitonin: 0.48 ng/mL

## 2020-10-29 LAB — D-DIMER, QUANTITATIVE: D-Dimer, Quant: 1.06 ug/mL-FEU — ABNORMAL HIGH (ref 0.00–0.50)

## 2020-10-29 LAB — C-REACTIVE PROTEIN: CRP: 8.1 mg/dL — ABNORMAL HIGH (ref <1.0)

## 2020-10-29 MED ORDER — QUETIAPINE FUMARATE 25 MG PO TABS
25.0000 mg | ORAL_TABLET | Freq: Every day | ORAL | Status: DC
  Administered 2020-10-29: 25 mg via ORAL
  Filled 2020-10-29: qty 1

## 2020-10-29 NOTE — TOC Initial Note (Signed)
Transition of Care [facility]) - Initial/Assessment Note    Patient Details  Name: [name] [name] MRN: [id number] Date of Birth: [birth year]  [facility] CM/SW Contact:    Lockie Pares, RN Phone Number: 10/29/2020, 9:38 AM  Clinical Narrative:                 From home , history of dementia MG, increased confusion. Normally patient can perform ADL COVID positive, pneumonia, superimposed bacterial on viral,  On 2L but very agitated, requiring restraints.  Not currently receiving HH services.  Patient will likely need HH, return to home would be the least restrictive and most familiar place for her mentation wise, but may need SNF.   Cm and CSW will follow for needs. Expected Discharge Plan: Home w Home Health Services Barriers to Discharge: Continued Medical Work up   Patient Goals and CMS Choice        Expected Discharge Plan and Services Expected Discharge Plan: Home w Home Health Services In-house Referral: Clinical Social Work Discharge Planning Services: CM Consult   Living arrangements for the past 2 months: Single Family Home                                      Prior Living Arrangements/Services Living arrangements for the past 2 months: Single Family Home Lives with:: Spouse Patient language and need for interpreter reviewed:: Yes        Need for Family Participation in Patient Care: Yes (Comment) Care giver support system in place?: Yes (comment)      Activities of Daily Living      Permission Sought/Granted                  Emotional Assessment     Affect (typically observed): Anxious Orientation: : Fluctuating Orientation (Suspected and/or reported Sundowners) Alcohol / Substance Use: Not Applicable Psych Involvement: No (comment)  Admission diagnosis:  Altered mental status, unspecified altered mental status type [R41.82] Acute respiratory failure due to COVID-19 (HCC) [U07.1, J96.00] Patient Active Problem List    Diagnosis Date Noted  . Acute respiratory failure due to COVID-19 (HCC) 10/27/2020  . Acute encephalopathy 10/27/2020  . Hearing loss 05/14/2019  . Myasthenia gravis (HCC)   . Bilateral impacted cerumen 08/20/2017  . Fungal otitis externa 08/20/2017  . Right ear pain 08/20/2017  . Healthcare maintenance 08/05/2016  . Advance care planning 08/04/2015  . Vitamin D deficiency 08/04/2015  . Knee pain 10/25/2014  . Alzheimer's type dementia (HCC)   . Medicare annual wellness visit, subsequent 02/12/2012  . FIBROMYALGIA 05/10/2010  . Osteoporosis 05/10/2010  . ANXIETY 11/12/2009  . DEPRESSION 02/09/2009  . Myasthenia gravis without exacerbation (HCC) 02/09/2009  . GERD 02/09/2009   PCP:  Joaquim Nam, MD Pharmacy:   [facility] - [place], [place] - [address] [place] [address] Phone: [phone] Fax: [phone]     Social Determinants of Health (SDOH) Interventions    Readmission Risk Interventions No flowsheet data found.

## 2020-10-29 NOTE — Progress Notes (Addendum)
PROGRESS NOTE    [name] [name]  SWF:[id number] DOB: [birth year] DOA: 10/27/2020 PCP: Tonia Ghent, MD   Brief Narrative:  HPI: [name] [name] is a 84 y.o. female with known history of dementia and ocular myasthenia gravis was found to be increasingly confused by patient's daughter and was brought to the ER.  Per the report patient usually is ambulatory and able to dress herself.  ED Course: In the ER patient appears lethargic and labs reveal creatinine 1.2 CBC largely unremarkable with chest x-ray showing wedge-shaped defect and Covid test was positive.  Critical care was consulted and at this time they felt that patient was stable enough to be admitted to the floor under hospitalist.  Subsequent which patient underwent CT angiogram of the head and neck which does not show anything acute except for possible pneumonia and CT angiogram of the chest was negative for pulmonary embolism but did show consolidation concerning for pneumonia.  Patient was started on empiric antibiotics for community-acquired pneumonia and also Covid pneumonia.  Subjective: Patient seen and examined , she herself denies any complaints, overnight she was significantly confused where she required a Air cabin crew.   Assessment & Plan:   Principal Problem:   Acute respiratory failure due to COVID-19 [facility]) Active Problems:   Alzheimer's type dementia ([place])   Myasthenia gravis ([place])   Acute encephalopathy   Acute hypoxic respiratory failure and sepsis secondary to COVID-19 pneumonia with superimposed possible bacterial pneumonia: - she is unvaccinated -  Patient met sepsis criteria based on the source of infection/pneumonia with tachycardia and tachypnea.  - Required 2 L nasal cannula telemetry, she is currently on room air . -  Continue Rocephin, doxycycline. - she is on remdesivir . - continue with IV Solu-Medrol.   - procalcitonin 0.48, she is on Doxycycline and rocephine - CT angiogram of the chest ruled  out PE.  Patient was evaluated by PCCM. - Patient was encouraged to prone, out of bed to chair, to use incentive spirometry and flutter valve.  Acute Metabolic  encephalopathy:  -Patient with underlying Alzheimer's dementia, continue with home medication . -She is with significant delirium at this point, this is most likely related hospital delirium and COVID-19 encephalopathy.  She was quite restless overnight where she did require a sitter and restraints, she appears to be much better this morning, she is currently off restraint, but still requiring sitter, I will increase her evening Seroquel to 25 units, continue with as needed Haldol meanwhile. - MRI, CT angiogram of head and neck all are negative for any acute pathology.  Case was discussed with neurology by admitting hospitalist.  History of myasthenia gravis:  - Patient is able to open both eyes without any trouble.  She does not seem to have any respiratory trouble indicating acute exacerbation of myasthenia gravis.  She was also evaluated by PCCM yesterday.  Continue NIF.  Resume pyridostigmine.  History of Alzheimer's dementia: We will resume Namenda.  DVT prophylaxis: enoxaparin (LOVENOX) injection 40 mg Start: 10/28/20 1000   Code Status: Full Code  Family Communication: Discussed  with husband and daughter Rodena Piety by phone  Status is: Inpatient  Remains inpatient appropriate because:Inpatient level of care appropriate due to severity of illness   Dispo: The patient is from: Home              Anticipated d/c is to: Home              Anticipated d/c date is: 2 days  Patient currently is not medically stable to d/c.        Estimated body mass index is 28.69 kg/m as calculated from the following:   Height as of 05/13/19: 5' 7" (1.702 m).   Weight as of this encounter: 83.1 kg.      Nutritional status:               Consultants:   None  Procedures:   None  Antimicrobials:   Anti-infectives (From admission, onward)   Start     Dose/Rate Route Frequency Ordered Stop   10/28/20 1000  remdesivir 100 mg in sodium chloride 0.9 % 100 mL IVPB       "Followed by" Linked Group Details   100 mg 200 mL/hr over 30 Minutes Intravenous Daily 10/27/20 2244 11/01/20 0959   10/27/20 2245  remdesivir 200 mg in sodium chloride 0.9% 250 mL IVPB       "Followed by" Linked Group Details   200 mg 580 mL/hr over 30 Minutes Intravenous Once 10/27/20 2244 10/28/20 0107   10/27/20 2245  cefTRIAXone (ROCEPHIN) 2 g in sodium chloride 0.9 % 100 mL IVPB        2 g 200 mL/hr over 30 Minutes Intravenous Every 24 hours 10/27/20 2244     10/27/20 2245  doxycycline (VIBRAMYCIN) 100 mg in sodium chloride 0.9 % 250 mL IVPB        100 mg 125 mL/hr over 120 Minutes Intravenous Every 12 hours 10/27/20 2244            Objective: Vitals:   10/28/20 2122 10/29/20 0000 10/29/20 0356 10/29/20 0800  BP: 113/81 125/67 110/71 (!) 144/113  Pulse: 84 75 88   Resp: _0 Temp: 98.6 F (37 C) (!) 97.4 F (36.3 C) 98.1 F (36.7 C) 97.7 F (36.5 C)  TempSrc: Oral Oral Oral Oral  SpO2: 94% 90% 92%   Weight: 83.1 kg       Intake/Output Summary (Last 24 hours) at 10/29/2020 1428 Last data filed at 10/29/2020 0900 Gross per 24 hour  Intake 760 ml  Output --  Net 760 ml   Filed Weights   10/28/20 2122  Weight: 83.1 kg    Examination:  Awake, confused, restless, in no apparent distress Symmetrical Chest wall movement, Good air movement bilaterally, CTAB RRR,No Gallops,Rubs or new Murmurs, No Parasternal Heave +ve B.Sounds, Abd Soft, No tenderness, No rebound - guarding or rigidity. No Cyanosis, Clubbing or edema, No new Rash or bruise      Data Reviewed: I have personally reviewed following labs and imaging studies  CBC: Recent Labs  Lab 10/27/20 1418 10/27/20 1449 10/27/20 1733 10/28/20 0101 10/28/20 0500 10/28/20 0901 10/29/20 0354  WBC 7.4  --   --  6.6 4.9  --  6.6   NEUTROABS 6.0  --   --   --  4.1  --  5.7  HGB 14.4   < > 14.3 14.1 15.3* 13.3 14.1  HCT 45.7   < > 42.0 44.7 48.9* 39.0 43.7  MCV 92.0  --   --  91.6 92.1  --  88.6  PLT 171  --   --  126* 138*  --  175   < > = values in this interval not displayed.   Basic Metabolic Panel: Recent Labs  Lab 10/27/20 1418 10/27/20 1449 10/27/20 1733 10/28/20 0101 10/28/20 0500 10/28/20 0901 10/29/20 0354  NA 138 139 138  --  140 142 142  K  3.9 4.1 3.9  --  4.2 3.7 4.0  CL 101 105  --   --  105  --  108  CO2 25  --   --   --  22  --  20*  GLUCOSE 117* 106*  --   --  138*  --  144*  BUN 21 29*  --   --  20  --  24*  CREATININE 1.24* 1.30*  --  1.21* 1.24*  --  1.26*  CALCIUM 8.7*  --   --   --  8.2*  --  8.3*   GFR: CrCl cannot be calculated (Unknown ideal weight.). Liver Function Tests: Recent Labs  Lab 10/27/20 1418 10/28/20 0500 10/29/20 0354  AST 24 30 38  ALT _0 ALKPHOS 64 59 62  BILITOT 0.8 0.8 0.5  PROT 6.2* 6.0* 5.9*  ALBUMIN 3.4* 3.3* 3.1*   No results for input(s): LIPASE, AMYLASE in the last 168 hours. No results for input(s): AMMONIA in the last 168 hours. Coagulation Profile: Recent Labs  Lab 10/27/20 1505  INR 1.0   Cardiac Enzymes: No results for input(s): CKTOTAL, CKMB, CKMBINDEX, TROPONINI in the last 168 hours. BNP (last 3 results) No results for input(s): PROBNP in the last 8760 hours. HbA1C: No results for input(s): HGBA1C in the last 72 hours. CBG: Recent Labs  Lab 10/28/20 2139 10/29/20 0003 10/29/20 0354 10/29/20 0846 10/29/20 1258  GLUCAP 118* 105* 148* 113* 144*   Lipid Profile: Recent Labs    10/27/20 1700  TRIG 83   Thyroid Function Tests: No results for input(s): TSH, T4TOTAL, FREET4, T3FREE, THYROIDAB in the last 72 hours. Anemia Panel: Recent Labs    10/27/20 1700  FERRITIN 804*   Sepsis Labs: Recent Labs  Lab 10/27/20 1605 10/27/20 1700 10/28/20 0101 10/28/20 0500 10/29/20 0354  PROCALCITON  --  0.54 0.75 0.65  0.48  LATICACIDVEN 1.5  --   --   --   --     Recent Results (from the past 240 hour(s))  Culture, blood (Routine x 2)     Status: None (Preliminary result)   Collection Time: 10/27/20  4:05 PM   Specimen: BLOOD  Result Value Ref Range Status   Specimen Description BLOOD LEFT UPPER ARM  Final   Special Requests   Final    BOTTLES DRAWN AEROBIC AND ANAEROBIC Blood Culture adequate volume   Culture   Final    NO GROWTH 2 DAYS Performed at [facility], [place] [address] [place], [place] [address]    Report Status PENDING  Incomplete      Radiology Studies: CT Angio Head W or Wo Contrast  Result Date: 10/27/2020 CLINICAL DATA:  Unsteady gait with slurred speech EXAM: CT ANGIOGRAPHY HEAD AND NECK TECHNIQUE: Multidetector CT imaging of the head and neck was performed using the standard protocol during bolus administration of intravenous contrast. Multiplanar CT image reconstructions and MIPs were obtained to evaluate the vascular anatomy. Carotid stenosis measurements (when applicable) are obtained utilizing NASCET criteria, using the distal internal carotid diameter as the denominator. CONTRAST:  55m OMNIPAQUE IOHEXOL 350 MG/ML SOLN COMPARISON:  None. FINDINGS: CTA NECK FINDINGS SKELETON: There is no bony spinal canal stenosis. No lytic or blastic lesion. OTHER NECK: Normal pharynx, larynx and major salivary glands. No cervical lymphadenopathy. Unremarkable thyroid gland. UPPER CHEST: Areas of consolidation in the right upper lobe AORTIC ARCH: There is calcific atherosclerosis of the aortic arch. There is no aneurysm, dissection or hemodynamically  significant stenosis of the visualized portion of the aorta. Conventional 3 vessel aortic branching pattern. The visualized proximal subclavian arteries are widely patent. RIGHT CAROTID SYSTEM: Normal without aneurysm, dissection or stenosis. LEFT CAROTID SYSTEM: Normal without aneurysm, dissection or stenosis. VERTEBRAL ARTERIES: Codominant  configuration. Both origins are clearly patent. There is no dissection, occlusion or flow-limiting stenosis to the skull base (V1-V3 segments). CTA HEAD FINDINGS POSTERIOR CIRCULATION: --Vertebral arteries: Normal V4 segments. --Inferior cerebellar arteries: Normal. --Basilar artery: Normal. --Superior cerebellar arteries: Normal. --Posterior cerebral arteries (PCA): Normal. ANTERIOR CIRCULATION: --Intracranial internal carotid arteries: Normal. --Anterior cerebral arteries (ACA): Normal. Both A1 segments are present. Patent anterior communicating artery (a-comm). --Middle cerebral arteries (MCA): Normal. VENOUS SINUSES: As permitted by contrast timing, patent. ANATOMIC VARIANTS: None Review of the MIP images confirms the above findings. IMPRESSION: 1. No emergent large vessel occlusion or high-grade stenosis of the intracranial arteries. 2. Areas of consolidation in the right upper lobe, concerning for pneumonia. Aortic Atherosclerosis (ICD10-I70.0). Electronically Signed   By: Ulyses Jarred M.D.   On: 10/27/2020 21:31   CT Angio Neck W and/or Wo Contrast  Result Date: 10/27/2020 CLINICAL DATA:  Unsteady gait with slurred speech EXAM: CT ANGIOGRAPHY HEAD AND NECK TECHNIQUE: Multidetector CT imaging of the head and neck was performed using the standard protocol during bolus administration of intravenous contrast. Multiplanar CT image reconstructions and MIPs were obtained to evaluate the vascular anatomy. Carotid stenosis measurements (when applicable) are obtained utilizing NASCET criteria, using the distal internal carotid diameter as the denominator. CONTRAST:  31m OMNIPAQUE IOHEXOL 350 MG/ML SOLN COMPARISON:  None. FINDINGS: CTA NECK FINDINGS SKELETON: There is no bony spinal canal stenosis. No lytic or blastic lesion. OTHER NECK: Normal pharynx, larynx and major salivary glands. No cervical lymphadenopathy. Unremarkable thyroid gland. UPPER CHEST: Areas of consolidation in the right upper lobe AORTIC  ARCH: There is calcific atherosclerosis of the aortic arch. There is no aneurysm, dissection or hemodynamically significant stenosis of the visualized portion of the aorta. Conventional 3 vessel aortic branching pattern. The visualized proximal subclavian arteries are widely patent. RIGHT CAROTID SYSTEM: Normal without aneurysm, dissection or stenosis. LEFT CAROTID SYSTEM: Normal without aneurysm, dissection or stenosis. VERTEBRAL ARTERIES: Codominant configuration. Both origins are clearly patent. There is no dissection, occlusion or flow-limiting stenosis to the skull base (V1-V3 segments). CTA HEAD FINDINGS POSTERIOR CIRCULATION: --Vertebral arteries: Normal V4 segments. --Inferior cerebellar arteries: Normal. --Basilar artery: Normal. --Superior cerebellar arteries: Normal. --Posterior cerebral arteries (PCA): Normal. ANTERIOR CIRCULATION: --Intracranial internal carotid arteries: Normal. --Anterior cerebral arteries (ACA): Normal. Both A1 segments are present. Patent anterior communicating artery (a-comm). --Middle cerebral arteries (MCA): Normal. VENOUS SINUSES: As permitted by contrast timing, patent. ANATOMIC VARIANTS: None Review of the MIP images confirms the above findings. IMPRESSION: 1. No emergent large vessel occlusion or high-grade stenosis of the intracranial arteries. 2. Areas of consolidation in the right upper lobe, concerning for pneumonia. Aortic Atherosclerosis (ICD10-I70.0). Electronically Signed   By: KUlyses JarredM.D.   On: 10/27/2020 21:31   CT ANGIO CHEST PE W OR WO CONTRAST  Result Date: 10/27/2020 CLINICAL DATA:  Hypoxia, elevated D-dimer. EXAM: CT ANGIOGRAPHY CHEST WITH CONTRAST TECHNIQUE: Multidetector CT imaging of the chest was performed using the standard protocol during bolus administration of intravenous contrast. Multiplanar CT image reconstructions and MIPs were obtained to evaluate the vascular anatomy. CONTRAST:  791mOMNIPAQUE IOHEXOL 350 MG/ML SOLN COMPARISON:  None.  FINDINGS: Cardiovascular: Satisfactory opacification of the pulmonary arteries to the segmental level. No evidence of pulmonary embolism. Normal  heart size. No pericardial effusion. Atherosclerosis of thoracic aorta is noted without aneurysm or dissection. Mediastinum/Nodes: No enlarged mediastinal, hilar, or axillary lymph nodes. Thyroid gland, trachea, and esophagus demonstrate no significant findings. Lungs/Pleura: No pneumothorax or pleural effusion is noted. Minimal bilateral posterior basilar subsegmental atelectasis is noted. Right upper lobe airspace opacity is noted concerning for pneumonia. Upper Abdomen: No acute abnormality. Musculoskeletal: No chest wall abnormality. No acute or significant osseous findings. Review of the MIP images confirms the above findings. IMPRESSION: 1. No definite evidence of pulmonary embolus. 2. Right upper lobe airspace opacity is noted concerning for pneumonia. 3. Aortic atherosclerosis. Aortic Atherosclerosis (ICD10-I70.0). Electronically Signed   By: Marijo Conception M.D.   On: 10/27/2020 20:47   MR BRAIN WO CONTRAST  Result Date: 10/28/2020 CLINICAL DATA:  Slurred speech and encephalopathy EXAM: MRI HEAD WITHOUT CONTRAST TECHNIQUE: Multiplanar, multiecho pulse sequences of the brain and surrounding structures were obtained without intravenous contrast. COMPARISON:  None. FINDINGS: Brain: No acute infarct, mass effect or extra-axial collection. No acute or chronic hemorrhage. There is multifocal hyperintense T2-weighted signal within the white matter. Generalized volume loss without a clear lobar predilection. The midline structures are normal. Vascular: Major flow voids are preserved. Skull and upper cervical spine: Normal calvarium and skull base. Visualized upper cervical spine and soft tissues are normal. Sinuses/Orbits:No paranasal sinus fluid levels or advanced mucosal thickening. No mastoid or middle ear effusion. Normal orbits. IMPRESSION: 1. No acute  intracranial abnormality. 2. Generalized volume loss and findings of chronic small vessel disease. Electronically Signed   By: Ulyses Jarred M.D.   On: 10/28/2020 00:09   DG Chest Port 1 View  Result Date: 10/27/2020 CLINICAL DATA:  COVID-19, hypoxia, CVA symptoms, confusion EXAM: PORTABLE CHEST 1 VIEW COMPARISON:  None. FINDINGS: Single frontal view of the chest demonstrates an unremarkable cardiac silhouette. The lung volumes are diminished, with patchy areas of bibasilar consolidation that could reflect atelectasis. There is a wedge-shaped area of ground-glass attenuation in the right lateral midlung zone. Given clinical presentation, aspiration could be considered, as well as infection. No effusion or pneumothorax. Prominent skin fold overlies the left lateral costophrenic angle. No acute bony abnormalities. IMPRESSION: 1. Wedge-shaped area of ground-glass attenuation within the lateral right midlung zone. Given clinical presentation of confusion and CVA symptoms, aspiration could be considered. Alternatively, this could reflect pneumonia. 2. Low lung volumes with bibasilar atelectasis. Electronically Signed   By: Randa Ngo M.D.   On: 10/27/2020 17:34    Scheduled Meds: . enoxaparin (LOVENOX) injection  40 mg Subcutaneous Daily  . memantine  10 mg Oral BID  . methylPREDNISolone (SOLU-MEDROL) injection  40 mg Intravenous BID   Followed by  . [START ON 10/31/2020] predniSONE  50 mg Oral Daily  . mirtazapine  7.5 mg Oral QHS  . pantoprazole  40 mg Oral Daily  . pyridostigmine  60 mg Oral TID  . QUEtiapine  12.5 mg Oral QHS   Continuous Infusions: . cefTRIAXone (ROCEPHIN)  IV 2 g (10/28/20 2244)  . doxycycline (VIBRAMYCIN) IV 100 mg (10/29/20 0957)  . remdesivir 100 mg in NS 100 mL 100 mg (10/29/20 0906)     LOS: 2 days   Phillips Climes, MD [facility]  10/29/2020, 2:28 PM   To contact the attending provider between 7A-7P or the covering provider during after hours 7P-7A,  please log into the web site [email or web address].

## 2020-10-29 NOTE — Progress Notes (Signed)
Patient received to the unit. Patient is alert and oriented x1. Vital signs are stable. Skin assessment done with another nurse. Iv in place and running fluid. Given instructions about call bell and phone. Bed in low position and call bell in place.

## 2020-10-29 NOTE — Progress Notes (Signed)
Cross-coverage note:   Patient encephalopathic, continues to remove mittens, pull at tubes and lines, trying to get of bed. She is a high fall-risk and unfortunately failed non-pharmacologic and pharmacologic interventions. Will use soft restraints with frequent reassessments and remove as soon as safe to do so.

## 2020-10-29 NOTE — Progress Notes (Signed)
Patient was not following command,continue trying to get out from the bed, pull at lines and tubes. Try for non-pharmacologic and pharmacologic doesn't help. Notified on call MD and started bilateral wrist and waist soft restraint as per ordered. Will continue monitor.

## 2020-10-30 DIAGNOSIS — G9340 Encephalopathy, unspecified: Secondary | ICD-10-CM | POA: Diagnosis not present

## 2020-10-30 DIAGNOSIS — G309 Alzheimer's disease, unspecified: Secondary | ICD-10-CM | POA: Diagnosis not present

## 2020-10-30 DIAGNOSIS — U071 COVID-19: Secondary | ICD-10-CM | POA: Diagnosis not present

## 2020-10-30 DIAGNOSIS — G7000 Myasthenia gravis without (acute) exacerbation: Secondary | ICD-10-CM | POA: Diagnosis not present

## 2020-10-30 LAB — CBC WITH DIFFERENTIAL/PLATELET
Abs Immature Granulocytes: 0.01 10*3/uL (ref 0.00–0.07)
Basophils Absolute: 0 10*3/uL (ref 0.0–0.1)
Basophils Relative: 0 %
Eosinophils Absolute: 0 10*3/uL (ref 0.0–0.5)
Eosinophils Relative: 0 %
HCT: 40.9 % (ref 36.0–46.0)
Hemoglobin: 13.2 g/dL (ref 12.0–15.0)
Immature Granulocytes: 0 %
Lymphocytes Relative: 10 %
Lymphs Abs: 0.6 10*3/uL — ABNORMAL LOW (ref 0.7–4.0)
MCH: 28.6 pg (ref 26.0–34.0)
MCHC: 32.3 g/dL (ref 30.0–36.0)
MCV: 88.5 fL (ref 80.0–100.0)
Monocytes Absolute: 0.4 10*3/uL (ref 0.1–1.0)
Monocytes Relative: 6 %
Neutro Abs: 5.2 10*3/uL (ref 1.7–7.7)
Neutrophils Relative %: 84 %
Platelets: 203 10*3/uL (ref 150–400)
RBC: 4.62 MIL/uL (ref 3.87–5.11)
RDW: 13.1 % (ref 11.5–15.5)
WBC: 6.1 10*3/uL (ref 4.0–10.5)
nRBC: 0 % (ref 0.0–0.2)

## 2020-10-30 LAB — VITAMIN B12: Vitamin B-12: 2122 pg/mL — ABNORMAL HIGH (ref 180–914)

## 2020-10-30 LAB — COMPREHENSIVE METABOLIC PANEL
ALT: 23 U/L (ref 0–44)
ALT: 22 U/L (ref 0–44)
AST: 38 U/L (ref 15–41)
AST: 27 U/L (ref 15–41)
Albumin: 2.9 g/dL — ABNORMAL LOW (ref 3.5–5.0)
Albumin: 2.8 g/dL — ABNORMAL LOW (ref 3.5–5.0)
Alkaline Phosphatase: 50 U/L (ref 38–126)
Alkaline Phosphatase: 52 U/L (ref 38–126)
Anion gap: 9 (ref 5–15)
Anion gap: 9 (ref 5–15)
BUN: 27 mg/dL — ABNORMAL HIGH (ref 8–23)
BUN: 32 mg/dL — ABNORMAL HIGH (ref 8–23)
CO2: 22 mmol/L (ref 22–32)
CO2: 22 mmol/L (ref 22–32)
Calcium: 8.1 mg/dL — ABNORMAL LOW (ref 8.9–10.3)
Calcium: 8.2 mg/dL — ABNORMAL LOW (ref 8.9–10.3)
Chloride: 110 mmol/L (ref 98–111)
Chloride: 112 mmol/L — ABNORMAL HIGH (ref 98–111)
Creatinine, Ser: 1.22 mg/dL — ABNORMAL HIGH (ref 0.44–1.00)
Creatinine, Ser: 1.09 mg/dL — ABNORMAL HIGH (ref 0.44–1.00)
GFR, Estimated: 44 mL/min — ABNORMAL LOW (ref >60)
GFR, Estimated: 51 mL/min — ABNORMAL LOW (ref >60)
Glucose, Bld: 162 mg/dL — ABNORMAL HIGH (ref 70–99)
Glucose, Bld: 129 mg/dL — ABNORMAL HIGH (ref 70–99)
Potassium: 3.8 mmol/L (ref 3.5–5.1)
Potassium: 4.2 mmol/L (ref 3.5–5.1)
Sodium: 141 mmol/L (ref 135–145)
Sodium: 143 mmol/L (ref 135–145)
Total Bilirubin: 0.7 mg/dL (ref 0.3–1.2)
Total Bilirubin: 0.2 mg/dL — ABNORMAL LOW (ref 0.3–1.2)
Total Protein: 5.3 g/dL — ABNORMAL LOW (ref 6.5–8.1)
Total Protein: 5.1 g/dL — ABNORMAL LOW (ref 6.5–8.1)

## 2020-10-30 LAB — BLOOD GAS, ARTERIAL
Acid-base deficit: 2.5 mmol/L — ABNORMAL HIGH (ref 0.0–2.0)
Bicarbonate: 21.5 mmol/L (ref 20.0–28.0)
Drawn by: 51155
FIO2: 28
O2 Saturation: 95.2 %
Patient temperature: 36.4
pCO2 arterial: 34.5 mmHg (ref 32.0–48.0)
pH, Arterial: 7.409 (ref 7.350–7.450)
pO2, Arterial: 74.9 mmHg — ABNORMAL LOW (ref 83.0–108.0)

## 2020-10-30 LAB — CBC
HCT: 41.1 % (ref 36.0–46.0)
Hemoglobin: 13.4 g/dL (ref 12.0–15.0)
MCH: 28.9 pg (ref 26.0–34.0)
MCHC: 32.6 g/dL (ref 30.0–36.0)
MCV: 88.6 fL (ref 80.0–100.0)
Platelets: 200 10*3/uL (ref 150–400)
RBC: 4.64 MIL/uL (ref 3.87–5.11)
RDW: 13.2 % (ref 11.5–15.5)
WBC: 8.7 10*3/uL (ref 4.0–10.5)
nRBC: 0 % (ref 0.0–0.2)

## 2020-10-30 LAB — GLUCOSE, CAPILLARY
Glucose-Capillary: 160 mg/dL — ABNORMAL HIGH (ref 70–99)
Glucose-Capillary: 134 mg/dL — ABNORMAL HIGH (ref 70–99)
Glucose-Capillary: 118 mg/dL — ABNORMAL HIGH (ref 70–99)
Glucose-Capillary: 203 mg/dL — ABNORMAL HIGH (ref 70–99)
Glucose-Capillary: 148 mg/dL — ABNORMAL HIGH (ref 70–99)
Glucose-Capillary: 136 mg/dL — ABNORMAL HIGH (ref 70–99)

## 2020-10-30 LAB — D-DIMER, QUANTITATIVE: D-Dimer, Quant: 0.68 ug/mL-FEU — ABNORMAL HIGH (ref 0.00–0.50)

## 2020-10-30 LAB — C-REACTIVE PROTEIN: CRP: 3.5 mg/dL — ABNORMAL HIGH (ref <1.0)

## 2020-10-30 MED ORDER — QUETIAPINE FUMARATE 25 MG PO TABS
12.5000 mg | ORAL_TABLET | Freq: Every day | ORAL | Status: DC
  Administered 2020-10-31 – 2020-11-11 (×9): 12.5 mg via ORAL
  Filled 2020-10-30 (×9): qty 1

## 2020-10-30 MED ORDER — QUETIAPINE FUMARATE 25 MG PO TABS: 25.0000 mg | ORAL_TABLET | Freq: Every day | ORAL | Status: DC

## 2020-10-30 MED ORDER — ENSURE ENLIVE PO LIQD
237.0000 mL | Freq: Two times a day (BID) | ORAL | Status: DC
  Administered 2020-10-30 – 2020-11-06 (×8): 237 mL via ORAL

## 2020-10-30 NOTE — Progress Notes (Signed)
Spoke with Tammy and updated her on her Mom.

## 2020-10-30 NOTE — Evaluation (Signed)
Occupational Therapy Evaluation Patient Details Name: [name] [name] MRN: [id number] DOB: [birth year] Today's Date: 10/30/2020    History of Present Illness 85 y.o. female with known history of dementia and ocular myasthenia gravis was found to be increasingly confused by patient's daughter and was brought to the ER 12/29/21where she was found to be COVID positive, chest X-ray concerning for PNA. Admitted acute hypoxic respiratory failure with sepsis secondary to COVID PNA, and acute metabolic encephalopathy requiring sitter.   Clinical Impression   Pt admitted with above diagnoses, presents with baseline cognitive deficits (dementia), generalized weakness, and poor activity tolerance. Pt is poor historian, so unsure of exact PLOF. Per chart review, pt is able to mobilize with RW and complete ADLs with set up assist. At time of eval, pt required mod A +2 to complete bed mobility and sit <> stands. She was able to mobilize from bed to recliner to chair with step by step verbal cues to sequence task properly. VSS on RA. Given current status, recommend SNF at d/c for continued ADL progression prior to d/c home. Will continue to follow per POC listed below.     Follow Up Recommendations  SNF;Supervision/Assistance - 24 hour    Equipment Recommendations  Wheelchair (measurements OT);Wheelchair cushion (measurements OT);Hospital bed    Recommendations for Other Services       Precautions / Restrictions Precautions Precautions: Fall Restrictions Weight Bearing Restrictions: No      Mobility Bed Mobility Overal bed mobility: Needs Assistance Bed Mobility: Supine to Sit     Supine to sit: Mod assist;+2 for physical assistance;+2 for safety/equipment     General bed mobility comments: assist needed to guide BLEs off of bed and at trunk to attain upright sitting. Cues needed for initation and sequencing of transfer    Transfers Overall transfer level: Needs assistance Equipment used:  Rolling walker (2 wheeled) Transfers: Sit to/from [employer] Sit to Stand: Mod assist;+2 physical assistance;+2 safety/equipment Stand pivot transfers: Mod assist;+2 physical assistance;+2 safety/equipment       General transfer comment: assist to power up and steady with cues to use RW in transfer    Balance Overall balance assessment: Needs assistance Sitting-balance support: Bilateral upper extremity supported;Feet supported Sitting balance-Leahy Scale: Fair     Standing balance support: Bilateral upper extremity supported;During functional activity Standing balance-Leahy Scale: Poor Standing balance comment: reliant on external support and staff assist                           ADL either performed or assessed with clinical judgement   ADL Overall ADL's : Needs assistance/impaired Eating/Feeding: Set up;Sitting   Grooming: Minimal assistance;Sitting   Upper Body Bathing: Minimal assistance;Sitting   Lower Body Bathing: Moderate assistance;Sitting/lateral leans;Sit to/from stand   Upper Body Dressing : Minimal assistance;Sitting   Lower Body Dressing: Moderate assistance;Sitting/lateral leans;Sit to/from stand   Toilet Transfer: Moderate assistance;+2 for physical assistance;+2 for safety/equipment;Stand-pivot;RW;BSC Toilet Transfer Details (indicate cue type and reason): simulated to recliner Toileting- Clothing Manipulation and Hygiene: Total assistance;Sitting/lateral lean;Sit to/from stand       Functional mobility during ADLs: Moderate assistance;+2 for physical assistance;+2 for safety/equipment;Rolling walker       Vision Baseline Vision/History: Wears glasses Wears Glasses: At all times Patient Visual Report: No change from baseline       Perception     Praxis      Pertinent Vitals/Pain Pain Assessment: Faces Faces Pain Scale: Hurts a little bit Pain  Location: IV site Pain Descriptors / Indicators: Aching Pain  Intervention(s): Monitored during session     Hand Dominance     Extremity/Trunk Assessment Upper Extremity Assessment Upper Extremity Assessment: Generalized weakness   Lower Extremity Assessment Lower Extremity Assessment: Generalized weakness       Communication Communication Communication: No difficulties   Cognition Arousal/Alertness: Awake/alert Behavior During Therapy: WFL for tasks assessed/performed Overall Cognitive Status: No family/caregiver present to determine baseline cognitive functioning                                 General Comments: Pt with history of dementia. Very repetitive with same questions. requires step by step cues to complete basic ADL tasks   General Comments       Exercises     Shoulder Instructions      Home Living Family/patient expects to be discharged to:: Private residence                                 Additional Comments: Poor historian, thinks she lives with her parents. Will have to call and verify info      Prior Functioning/Environment          Comments: Per chart pt uses RW for mobility and is able to do ADLs with set up        OT Problem List: Decreased strength;Decreased knowledge of use of DME or AE;Decreased activity tolerance;Decreased cognition;Impaired balance (sitting and/or standing);Decreased safety awareness      OT Treatment/Interventions: Self-care/ADL training;Therapeutic exercise;Patient/family education;Balance training;Energy conservation;Therapeutic activities;DME and/or AE instruction    OT Goals(Current goals can be found in the care plan section) Acute Rehab OT Goals Patient Stated Goal: sit by the window in the sun OT Goal Formulation: With patient Time For Goal Achievement: 11/13/20 Potential to Achieve Goals: Good  OT Frequency: Min 2X/week   Barriers to D/C:            Co-evaluation PT/OT/SLP Co-Evaluation/Treatment: Yes Reason for Co-Treatment: For  patient/therapist safety;To address functional/ADL transfers   OT goals addressed during session: ADL's and self-care;Proper use of Adaptive equipment and DME;Strengthening/ROM      AM-PAC OT "6 Clicks" Daily Activity     Outcome Measure Help from another person eating meals?: A Little Help from another person taking care of personal grooming?: A Little Help from another person toileting, which includes using toliet, bedpan, or urinal?: A Lot Help from another person bathing (including washing, rinsing, drying)?: A Lot Help from another person to put on and taking off regular upper body clothing?: A Little Help from another person to put on and taking off regular lower body clothing?: A Lot 6 Click Score: 15   End of Session Equipment Utilized During Treatment: Gait belt;Rolling walker Nurse Communication: Mobility status  Activity Tolerance: Patient tolerated treatment well Patient left: in chair;with call bell/phone within reach;with chair alarm set;with restraints reapplied  OT Visit Diagnosis: Unsteadiness on feet (R26.81);Other abnormalities of gait and mobility (R26.89);Muscle weakness (generalized) (M62.81);Other symptoms and signs involving cognitive function                Time: 1142-1208 OT Time Calculation (min): 26 min Charges:  OT General Charges $OT Visit: 1 Visit OT Evaluation $OT Eval Moderate Complexity: 1 Mod  Dalphine Handing, MSOT, OTR/L Acute Rehabilitation Services [facility] Office Number: [phone] Pager: [phone]  Dalphine Handing 10/30/2020, 1:28  PM

## 2020-10-30 NOTE — Progress Notes (Signed)
PROGRESS NOTE    [name] [name]  MOL:[id number] DOB: [birth year] DOA: 10/27/2020 PCP: Tonia Ghent, MD   Brief Narrative:   HPI: [name] [name] is a 85 y.o. female with known history of dementia and ocular myasthenia gravis was found to be increasingly confused by patient's daughter and was brought to the ER.  Per the report patient usually is ambulatory and able to dress herself.  ED Course: In the ER patient appears lethargic and labs reveal creatinine 1.2 CBC largely unremarkable with chest x-ray showing wedge-shaped defect and Covid test was positive.  Critical care was consulted and at this time they felt that patient was stable enough to be admitted to the floor under hospitalist.  Subsequent which patient underwent CT angiogram of the head and neck which does not show anything acute except for possible pneumonia and CT angiogram of the chest was negative for pulmonary embolism but did show consolidation concerning for pneumonia.  Patient was started on empiric antibiotics for community-acquired pneumonia and also Covid pneumonia.  Subjective: Patient seen and examined , she herself denies any complaints, have discussed with staff, no significant events overnight, but she was awake till late 2:00 in a.m.  -She did not require a sitter overnight   Assessment & Plan:   Principal Problem:   Acute respiratory failure due to COVID-19 [facility]) Active Problems:   Alzheimer's type dementia ([place])   Myasthenia gravis ([place])   Acute encephalopathy   Acute hypoxic respiratory failure and sepsis secondary to COVID-19 pneumonia with superimposed possible bacterial pneumonia: - she is unvaccinated -  Patient met sepsis criteria based on the source of infection/pneumonia with tachycardia and tachypnea.  - Required 2 L nasal cannula telemetry, she is currently on room air . -  Continue Rocephin, doxycycline. - she is on remdesivir . - continue with IV Solu-Medrol.   - procalcitonin 0.48, she  is on Doxycycline and rocephine - CT angiogram of the chest ruled out PE.  Patient was evaluated by PCCM. - Patient was encouraged to use incentive spirometry and flutter valve.  Acute Metabolic  encephalopathy:  -Patient with underlying Alzheimer's dementia, continue with home medication . -She is with significant delirium at this point, this is most likely related hospital delirium and COVID-19 encephalopathy.  She did require a sitter initially, this has improved after starting Seroquel, but she is sleepy this morning, and slept late yesterday, so I will give it earlier this evening at 8 PM, continue with as needed Haldol . - MRI, CT angiogram of head and neck all are negative for any acute pathology.  Case was discussed with neurology by admitting hospitalist.  History of myasthenia gravis:  - Patient is able to open both eyes without any trouble.  She does not seem to have any respiratory trouble indicating acute exacerbation of myasthenia gravis.  She was also evaluated by PCCM yesterday.  Continue NIF.  Resume pyridostigmine.  History of Alzheimer's dementia:  - ON  Namenda.  DVT prophylaxis: enoxaparin (LOVENOX) injection 40 mg Start: 10/28/20 1000   Code Status: Full Code  Family Communication: Discussed  with husband and daughter Rodena Piety by phone 10/29/2020  Status is: Inpatient  Remains inpatient appropriate because:Inpatient level of care appropriate due to severity of illness   Dispo: The patient is from: Home              Anticipated d/c is to: Home              Anticipated d/c date  is: 2 days              Patient currently is not medically stable to d/c.        Estimated body mass index is 28.69 kg/m as calculated from the following:   Height as of this encounter: _0  (1.702 m).   Weight as of this encounter: 83.1 kg.      Nutritional status:               Consultants:   None  Procedures:   None  Antimicrobials:  Anti-infectives (From  admission, onward)   Start     Dose/Rate Route Frequency Ordered Stop   10/28/20 1000  remdesivir 100 mg in sodium chloride 0.9 % 100 mL IVPB       "Followed by" Linked Group Details   100 mg 200 mL/hr over 30 Minutes Intravenous Daily 10/27/20 2244 11/01/20 0959   10/27/20 2245  remdesivir 200 mg in sodium chloride 0.9% 250 mL IVPB       "Followed by" Linked Group Details   200 mg 580 mL/hr over 30 Minutes Intravenous Once 10/27/20 2244 10/28/20 0107   10/27/20 2245  cefTRIAXone (ROCEPHIN) 2 g in sodium chloride 0.9 % 100 mL IVPB        2 g 200 mL/hr over 30 Minutes Intravenous Every 24 hours 10/27/20 2244     10/27/20 2245  doxycycline (VIBRAMYCIN) 100 mg in sodium chloride 0.9 % 250 mL IVPB        100 mg 125 mL/hr over 120 Minutes Intravenous Every 12 hours 10/27/20 2244            Objective: Vitals:   10/29/20 2300 10/30/20 0412 10/30/20 0834 10/30/20 1219  BP: 138/85 115/75 (!) 136/121 134/74  Pulse: 65 60 65 83  Resp: _1 Temp: (!) 97.4 F (36.3 C) (!) 97.5 F (36.4 C) (!) 97.5 F (36.4 C) 98 F (36.7 C)  TempSrc: Axillary Axillary Axillary Axillary  SpO2: 97% 90% 96% (!) 88%  Weight:      Height:        Intake/Output Summary (Last 24 hours) at 10/30/2020 1254 Last data filed at 10/30/2020 0800 Gross per 24 hour  Intake 752.08 ml  Output 1100 ml  Net -347.92 ml   Filed Weights   10/28/20 2122  Weight: 83.1 kg    Examination:  Awake she is sleeping comfortably, but wakes up, remains confused, pleasant Symmetrical Chest wall movement, Good air movement bilaterally, CTAB RRR,No Gallops,Rubs or new Murmurs, No Parasternal Heave +ve B.Sounds, Abd Soft, No tenderness, No rebound - guarding or rigidity. No Cyanosis, Clubbing or edema, No new Rash or bruise      Data Reviewed: I have personally reviewed following labs and imaging studies  CBC: Recent Labs  Lab 10/27/20 1418 10/27/20 1449 10/28/20 0101 10/28/20 0500 10/28/20 0901 10/29/20 0354  10/30/20 0439  WBC 7.4  --  6.6 4.9  --  6.6 6.1  NEUTROABS 6.0  --   --  4.1  --  5.7 5.2  HGB 14.4   < > 14.1 15.3* 13.3 14.1 13.2  HCT 45.7   < > 44.7 48.9* 39.0 43.7 40.9  MCV 92.0  --  91.6 92.1  --  88.6 88.5  PLT 171  --  126* 138*  --  175 203   < > = values in this interval not displayed.   Basic Metabolic Panel: Recent Labs  Lab 10/27/20 1418 10/27/20 1449 10/27/20  1733 10/28/20 0101 10/28/20 0500 10/28/20 0901 10/29/20 0354 10/30/20 0439  NA 138 139 138  --  140 142 142 141  K 3.9 4.1 3.9  --  4.2 3.7 4.0 3.8  CL 101 105  --   --  105  --  108 110  CO2 25  --   --   --  22  --  20* 22  GLUCOSE 117* 106*  --   --  138*  --  144* 162*  BUN 21 29*  --   --  20  --  24* 27*  CREATININE 1.24* 1.30*  --  1.21* 1.24*  --  1.26* 1.22*  CALCIUM 8.7*  --   --   --  8.2*  --  8.3* 8.1*   GFR: Estimated Creatinine Clearance: 39.4 mL/min (A) (by C-G formula based on SCr of 1.22 mg/dL (H)). Liver Function Tests: Recent Labs  Lab 10/27/20 1418 10/28/20 0500 10/29/20 0354 10/30/20 0439  AST 24 30 38 38  ALT _0 ALKPHOS 64 59 62 50  BILITOT 0.8 0.8 0.5 0.7  PROT 6.2* 6.0* 5.9* 5.3*  ALBUMIN 3.4* 3.3* 3.1* 2.9*   No results for input(s): LIPASE, AMYLASE in the last 168 hours. No results for input(s): AMMONIA in the last 168 hours. Coagulation Profile: Recent Labs  Lab 10/27/20 1505  INR 1.0   Cardiac Enzymes: No results for input(s): CKTOTAL, CKMB, CKMBINDEX, TROPONINI in the last 168 hours. BNP (last 3 results) No results for input(s): PROBNP in the last 8760 hours. HbA1C: No results for input(s): HGBA1C in the last 72 hours. CBG: Recent Labs  Lab 10/29/20 1942 10/29/20 2355 10/30/20 0411 10/30/20 0827 10/30/20 1223  GLUCAP 133* 131* 160* 134* 118*   Lipid Profile: Recent Labs    10/27/20 1700  TRIG 83   Thyroid Function Tests: No results for input(s): TSH, T4TOTAL, FREET4, T3FREE, THYROIDAB in the last 72 hours. Anemia Panel: Recent  Labs    10/27/20 1700 10/30/20 0439  VITAMINB12  --  2,122*  FERRITIN 804*  --    Sepsis Labs: Recent Labs  Lab 10/27/20 1605 10/27/20 1700 10/28/20 0101 10/28/20 0500 10/29/20 0354  PROCALCITON  --  0.54 0.75 0.65 0.48  LATICACIDVEN 1.5  --   --   --   --     Recent Results (from the past 240 hour(s))  Culture, blood (Routine x 2)     Status: None (Preliminary result)   Collection Time: 10/27/20  4:05 PM   Specimen: BLOOD  Result Value Ref Range Status   Specimen Description BLOOD LEFT UPPER ARM  Final   Special Requests   Final    BOTTLES DRAWN AEROBIC AND ANAEROBIC Blood Culture adequate volume   Culture   Final    NO GROWTH 3 DAYS Performed at [facility], [place] [address] [place], [place] [address]    Report Status PENDING  Incomplete      Radiology Studies: No results found.  Scheduled Meds:  enoxaparin (LOVENOX) injection  40 mg Subcutaneous Daily   memantine  10 mg Oral BID   methylPREDNISolone (SOLU-MEDROL) injection  40 mg Intravenous BID   mirtazapine  7.5 mg Oral QHS   pantoprazole  40 mg Oral Daily   pyridostigmine  60 mg Oral TID   QUEtiapine  25 mg Oral QHS   Continuous Infusions:  cefTRIAXone (ROCEPHIN)  IV 2 g (10/29/20 2125)   doxycycline (VIBRAMYCIN) IV 100 mg (10/30/20 1005)  remdesivir 100 mg in NS 100 mL 100 mg (10/30/20 1249)     LOS: 3 days   Phillips Climes, MD [facility]  10/30/2020, 12:54 PM   To contact the attending provider between 7A-7P or the covering provider during after hours 7P-7A, please log into the web site [email or web address].

## 2020-10-30 NOTE — Progress Notes (Signed)
Dr. Randol Kern notified about patient being very sleepy today. He will decrease dose of Seroquel tonight.

## 2020-10-30 NOTE — Evaluation (Signed)
Physical Therapy Evaluation Patient Details Name: [name] [name] MRN: [id number] DOB: 07/17/1936 Today's Date: 10/30/2020   History of Present Illness  85 y.o. female with known history of dementia and ocular myasthenia gravis was found to be increasingly confused by patient's daughter and was brought to the ER 12/29/21where she was found to be COVID positive, chest X-ray concerning for PNA. Admitted acute hypoxic respiratory failure with sepsis secondary to COVID PNA, and acute metabolic encephalopathy requiring sitter.  Clinical Impression  Pt with baseline dementia and is poor historian in providing home set up and PLOF information. Pt reported to therapist that she was still living at home with her parents. Pt is limited in safe mobility by her decreased cognition and safety awareness, in presence of decreased strength and balance. Pt is currently modAx2 for bed mobility, transfers and ambulation of 5 feet from bed to recliner. PT recommending SNF level rehab prior to return home to improve strength and balance to be able to more safely navigate. PT will continue to follow acutely.     Follow Up Recommendations SNF    Equipment Recommendations  Wheelchair (measurements PT);Wheelchair cushion (measurements PT);Hospital bed       Precautions / Restrictions Precautions Precautions: Fall Restrictions Weight Bearing Restrictions: No      Mobility  Bed Mobility Overal bed mobility: Needs Assistance Bed Mobility: Supine to Sit     Supine to sit: Mod assist;+2 for physical assistance;+2 for safety/equipment     General bed mobility comments: assist needed to guide BLEs off of bed and at trunk to attain upright sitting. Cues needed for initation and sequencing of transfer    Transfers Overall transfer level: Needs assistance Equipment used: Rolling walker (2 wheeled) Transfers: Sit to/from [employer] Sit to Stand: Mod assist;+2 physical assistance;+2  safety/equipment Stand pivot transfers: Mod assist;+2 physical assistance;+2 safety/equipment       General transfer comment: assist to power up and steady with cues to use RW in transfer  Ambulation/Gait Ambulation/Gait assistance: Mod assist Gait Distance (Feet): 5 Feet Assistive device: Rolling walker (2 wheeled) Gait Pattern/deviations: Step-through pattern;Decreased step length - right;Decreased step length - left;Trunk flexed Gait velocity: slowed Gait velocity interpretation: <1.31 ft/sec, indicative of household ambulator General Gait Details: modA at hips for steadying, pt with very poor use of RW picking it up to move around obstacles decreasing her balance.         Balance Overall balance assessment: Needs assistance Sitting-balance support: Bilateral upper extremity supported;Feet supported Sitting balance-Leahy Scale: Fair     Standing balance support: Bilateral upper extremity supported;During functional activity Standing balance-Leahy Scale: Poor Standing balance comment: reliant on external support and staff assist                             Pertinent Vitals/Pain Pain Assessment: Faces Faces Pain Scale: Hurts a little bit Pain Location: IV site Pain Descriptors / Indicators: Aching Pain Intervention(s): Monitored during session;Other (comment) (soft mitts replaced to keep her from pulling on IV)    Home Living Family/patient expects to be discharged to:: Private residence                 Additional Comments: Poor historian, thinks she lives with her parents. Will have to call and verify info    Prior Function           Comments: Per chart pt uses RW for mobility and is able to do ADLs with  set up     Hand Dominance        Extremity/Trunk Assessment   Upper Extremity Assessment Upper Extremity Assessment: Generalized weakness    Lower Extremity Assessment Lower Extremity Assessment: Generalized weakness        Communication   Communication: No difficulties  Cognition Arousal/Alertness: Awake/alert Behavior During Therapy: WFL for tasks assessed/performed Overall Cognitive Status: No family/caregiver present to determine baseline cognitive functioning                                 General Comments: Pt with history of dementia. Very repetitive with same questions. requires step by step cues to complete basic ADL tasks      General Comments General comments (skin integrity, edema, etc.): VSS on RA        Assessment/Plan    PT Assessment Patient needs continued PT services  PT Problem List Decreased activity tolerance;Decreased balance;Decreased mobility;Decreased cognition;Decreased safety awareness;Decreased knowledge of use of DME       PT Treatment Interventions DME instruction;Gait training;Functional mobility training;Therapeutic activities;Therapeutic exercise;Balance training;Cognitive remediation;Patient/family education    PT Goals (Current goals can be found in the Care Plan section)  Acute Rehab PT Goals Patient Stated Goal: sit by the window in the sun PT Goal Formulation: With patient Time For Goal Achievement: 11/13/20 Potential to Achieve Goals: Fair    Frequency Min 2X/week     Co-evaluation PT/OT/SLP Co-Evaluation/Treatment: Yes Reason for Co-Treatment: For patient/therapist safety;To address functional/ADL transfers   OT goals addressed during session: ADL's and self-care;Proper use of Adaptive equipment and DME;Strengthening/ROM       AM-PAC PT "6 Clicks" Mobility  Outcome Measure Help needed turning from your back to your side while in a flat bed without using bedrails?: A Lot Help needed moving from lying on your back to sitting on the side of a flat bed without using bedrails?: A Lot Help needed moving to and from a bed to a chair (including a wheelchair)?: A Lot Help needed standing up from a chair using your arms (e.g., wheelchair or  bedside chair)?: A Lot Help needed to walk in hospital room?: A Lot Help needed climbing 3-5 steps with a railing? : Total 6 Click Score: 11    End of Session   Activity Tolerance: Patient tolerated treatment well Patient left: in chair;with call bell/phone within reach;with chair alarm set Nurse Communication: Mobility status PT Visit Diagnosis: Unsteadiness on feet (R26.81);Other abnormalities of gait and mobility (R26.89);Muscle weakness (generalized) (M62.81);Difficulty in walking, not elsewhere classified (R26.2)    Time: 6789-3810 PT Time Calculation (min) (ACUTE ONLY): 26 min   Charges:   PT Evaluation $PT Eval Moderate Complexity: 1 Mod          Richanda Darin B. Beverely Risen PT, DPT Acute Rehabilitation Services Pager [phone] Office [phone]   Elon Alas Fleet 10/30/2020, 1:48 PM

## 2020-10-30 NOTE — Progress Notes (Signed)
Upon coming into room to change patient, she seemed much more lethargic. She would respond to a deep sternal rub but she is much less responsive than earlier. She is moving all extremities but not really following commands. Rapid response has been called and they will come to assess patient and Dr. Antionette Char paged and he has put in new orders. All VS stable and CBG 148.

## 2020-10-31 DIAGNOSIS — G9340 Encephalopathy, unspecified: Secondary | ICD-10-CM | POA: Diagnosis not present

## 2020-10-31 DIAGNOSIS — J9600 Acute respiratory failure, unspecified whether with hypoxia or hypercapnia: Secondary | ICD-10-CM | POA: Diagnosis not present

## 2020-10-31 DIAGNOSIS — U071 COVID-19: Secondary | ICD-10-CM | POA: Diagnosis not present

## 2020-10-31 LAB — COMPREHENSIVE METABOLIC PANEL
ALT: 21 U/L (ref 0–44)
AST: 25 U/L (ref 15–41)
Albumin: 2.8 g/dL — ABNORMAL LOW (ref 3.5–5.0)
Alkaline Phosphatase: 50 U/L (ref 38–126)
Anion gap: 9 (ref 5–15)
BUN: 31 mg/dL — ABNORMAL HIGH (ref 8–23)
CO2: 25 mmol/L (ref 22–32)
Calcium: 8.3 mg/dL — ABNORMAL LOW (ref 8.9–10.3)
Chloride: 110 mmol/L (ref 98–111)
Creatinine, Ser: 1.15 mg/dL — ABNORMAL HIGH (ref 0.44–1.00)
GFR, Estimated: 48 mL/min — ABNORMAL LOW (ref >60)
Glucose, Bld: 151 mg/dL — ABNORMAL HIGH (ref 70–99)
Potassium: 4.5 mmol/L (ref 3.5–5.1)
Sodium: 144 mmol/L (ref 135–145)
Total Bilirubin: 0.4 mg/dL (ref 0.3–1.2)
Total Protein: 5.3 g/dL — ABNORMAL LOW (ref 6.5–8.1)

## 2020-10-31 LAB — C-REACTIVE PROTEIN: CRP: 3.4 mg/dL — ABNORMAL HIGH (ref <1.0)

## 2020-10-31 LAB — CBC WITH DIFFERENTIAL/PLATELET
Abs Immature Granulocytes: 0.03 10*3/uL (ref 0.00–0.07)
Basophils Absolute: 0 10*3/uL (ref 0.0–0.1)
Basophils Relative: 0 %
Eosinophils Absolute: 0 10*3/uL (ref 0.0–0.5)
Eosinophils Relative: 0 %
HCT: 41.7 % (ref 36.0–46.0)
Hemoglobin: 13.2 g/dL (ref 12.0–15.0)
Immature Granulocytes: 0 %
Lymphocytes Relative: 11 %
Lymphs Abs: 0.8 10*3/uL (ref 0.7–4.0)
MCH: 28.6 pg (ref 26.0–34.0)
MCHC: 31.7 g/dL (ref 30.0–36.0)
MCV: 90.3 fL (ref 80.0–100.0)
Monocytes Absolute: 0.3 10*3/uL (ref 0.1–1.0)
Monocytes Relative: 4 %
Neutro Abs: 6.2 10*3/uL (ref 1.7–7.7)
Neutrophils Relative %: 85 %
Platelets: 195 10*3/uL (ref 150–400)
RBC: 4.62 MIL/uL (ref 3.87–5.11)
RDW: 13.2 % (ref 11.5–15.5)
WBC: 7.3 10*3/uL (ref 4.0–10.5)
nRBC: 0 % (ref 0.0–0.2)

## 2020-10-31 LAB — GLUCOSE, CAPILLARY
Glucose-Capillary: 132 mg/dL — ABNORMAL HIGH (ref 70–99)
Glucose-Capillary: 136 mg/dL — ABNORMAL HIGH (ref 70–99)
Glucose-Capillary: 149 mg/dL — ABNORMAL HIGH (ref 70–99)
Glucose-Capillary: 159 mg/dL — ABNORMAL HIGH (ref 70–99)
Glucose-Capillary: 184 mg/dL — ABNORMAL HIGH (ref 70–99)
Glucose-Capillary: 197 mg/dL — ABNORMAL HIGH (ref 70–99)

## 2020-10-31 LAB — D-DIMER, QUANTITATIVE: D-Dimer, Quant: 0.63 ug/mL-FEU — ABNORMAL HIGH (ref 0.00–0.50)

## 2020-10-31 NOTE — Progress Notes (Signed)
PROGRESS NOTE    [name] [name]  IFO:[id number] DOB: 11-Nov-1935 DOA: 10/27/2020 PCP: Tonia Ghent, MD   Brief Narrative:   HPI: [name] [name] is a 85 y.o. female with known history of dementia and ocular myasthenia gravis was found to be increasingly confused by patient's daughter and was brought to the ER.  Per the report patient usually is ambulatory and able to dress herself.  ED Course: In the ER patient appears lethargic and labs reveal creatinine 1.2 CBC largely unremarkable with chest x-ray showing wedge-shaped defect and Covid test was positive.  Critical care was consulted and at this time they felt that patient was stable enough to be admitted to the floor under hospitalist.  Subsequent which patient underwent CT angiogram of the head and neck which does not show anything acute except for possible pneumonia and CT angiogram of the chest was negative for pulmonary embolism but did show consolidation concerning for pneumonia.  Patient was started on empiric antibiotics for community-acquired pneumonia and also Covid pneumonia.  Subjective: Patient seen and examined , pains, she is more awake, but appetite remains poor this morning, she did not require a sitter for last 48 hours .  Assessment & Plan:   Principal Problem:   Acute respiratory failure due to COVID-19 [facility]) Active Problems:   Alzheimer's type dementia ([place])   Myasthenia gravis ([place])   Acute encephalopathy   Acute hypoxic respiratory failure and sepsis secondary to COVID-19 pneumonia with superimposed possible bacterial pneumonia: - she is unvaccinated -  Patient met sepsis criteria based on the source of infection/pneumonia with tachycardia and tachypnea.  - Required 2 L nasal cannula telemetry, she is currently on room air . -  Continue Rocephin, doxycycline. - she is on remdesivir . - continue with IV Solu-Medrol.   - procalcitonin 0.48, she is on Doxycycline and rocephine - CT angiogram of the chest  ruled out PE.  Patient was evaluated by PCCM. - Patient was encouraged to use incentive spirometry and flutter valve.  Acute Metabolic  encephalopathy:  -Patient with underlying Alzheimer's dementia, continue with home medication . -She is with significant delirium at this point, this is most likely related hospital delirium and COVID-19 encephalopathy.   -Has been improving, she is less restless, and more compliant, Seroquel has been discontinued given some significant she was quite sleepy yesterday, she is more awake and appropriate today . -He is off Seroquel, continue with as needed Haldol . - MRI, CT angiogram of head and neck all are negative for any acute pathology.  Case was discussed with neurology by admitting hospitalist.  History of myasthenia gravis:  - Patient is able to open both eyes without any trouble.  She does not seem to have any respiratory trouble indicating acute exacerbation of myasthenia gravis.  She was also evaluated by PCCM yesterday.  Continue NIF.  Resume pyridostigmine.  History of Alzheimer's dementia:  - ON  Namenda.  DVT prophylaxis: enoxaparin (LOVENOX) injection 40 mg Start: 10/28/20 1000   Code Status: Full Code  Family Communication: Discussed  with husband and daughter Rodena Piety by phone 10/29/2020, 10/31/2020  Status is: Inpatient  Remains inpatient appropriate because:Inpatient level of care appropriate due to severity of illness   Dispo: The patient is from: Home              Anticipated d/c is to: SNF              Anticipated d/c date is: 2 days  Patient currently is not medically stable to d/c.        Estimated body mass index is 28.69 kg/m as calculated from the following:   Height as of this encounter: 5' 7" (1.702 m).   Weight as of this encounter: 83.1 kg.      Nutritional status:               Consultants:   None  Procedures:   None  Antimicrobials:  Anti-infectives (From admission, onward)    Start     Dose/Rate Route Frequency Ordered Stop   10/28/20 1000  remdesivir 100 mg in sodium chloride 0.9 % 100 mL IVPB       "Followed by" Linked Group Details   100 mg 200 mL/hr over 30 Minutes Intravenous Daily 10/27/20 2244 10/31/20 1244   10/27/20 2245  remdesivir 200 mg in sodium chloride 0.9% 250 mL IVPB       "Followed by" Linked Group Details   200 mg 580 mL/hr over 30 Minutes Intravenous Once 10/27/20 2244 10/28/20 0107   10/27/20 2245  cefTRIAXone (ROCEPHIN) 2 g in sodium chloride 0.9 % 100 mL IVPB        2 g 200 mL/hr over 30 Minutes Intravenous Every 24 hours 10/27/20 2244     10/27/20 2245  doxycycline (VIBRAMYCIN) 100 mg in sodium chloride 0.9 % 250 mL IVPB        100 mg 125 mL/hr over 120 Minutes Intravenous Every 12 hours 10/27/20 2244            Objective: Vitals:   10/31/20 0001 10/31/20 0424 10/31/20 0754 10/31/20 1134  BP: (!) 151/74 130/65 134/78 140/80  Pulse: 62 (!) 53 (!) 49 (!) 47  Resp: _0 Temp: 97.8 F (36.6 C) 97.6 F (36.4 C) (!) 97.3 F (36.3 C) 98.2 F (36.8 C)  TempSrc: Axillary Axillary Axillary Axillary  SpO2: 94% 96% 94% 97%  Weight:      Height:        Intake/Output Summary (Last 24 hours) at 10/31/2020 1412 Last data filed at 10/31/2020 0537 Gross per 24 hour  Intake 590 ml  Output 650 ml  Net -60 ml   Filed Weights   10/28/20 2122  Weight: 83.1 kg    Examination:  Is awake and alert today, she is significantly demented, she is pleasant and follows simple commands . Symmetrical Chest wall movement, Good air movement bilaterally, CTAB RRR,No Gallops,Rubs or new Murmurs, No Parasternal Heave +ve B.Sounds, Abd Soft, No tenderness, No rebound - guarding or rigidity. No Cyanosis, Clubbing or edema, No new Rash or bruise      Data Reviewed: I have personally reviewed following labs and imaging studies  CBC: Recent Labs  Lab 10/27/20 1418 10/27/20 1449 10/28/20 0500 10/28/20 0901 10/29/20 0354 10/30/20 0439  10/30/20 2205 10/31/20 0147  WBC 7.4   < > 4.9  --  6.6 6.1 8.7 7.3  NEUTROABS 6.0  --  4.1  --  5.7 5.2  --  6.2  HGB 14.4   < > 15.3* 13.3 14.1 13.2 13.4 13.2  HCT 45.7   < > 48.9* 39.0 43.7 40.9 41.1 41.7  MCV 92.0   < > 92.1  --  88.6 88.5 88.6 90.3  PLT 171   < > 138*  --  175 203 200 195   < > = values in this interval not displayed.   Basic Metabolic Panel: Recent Labs  Lab 10/28/20 0500 10/28/20 0901  10/29/20 0354 10/30/20 0439 10/30/20 2205 10/31/20 0147  NA 140 142 142 141 143 144  K 4.2 3.7 4.0 3.8 4.2 4.5  CL 105  --  108 110 112* 110  CO2 22  --  20* _0 GLUCOSE 138*  --  144* 162* 129* 151*  BUN 20  --  24* 27* 32* 31*  CREATININE 1.24*  --  1.26* 1.22* 1.09* 1.15*  CALCIUM 8.2*  --  8.3* 8.1* 8.2* 8.3*   GFR: Estimated Creatinine Clearance: 41.8 mL/min (A) (by C-G formula based on SCr of 1.15 mg/dL (H)). Liver Function Tests: Recent Labs  Lab 10/28/20 0500 10/29/20 0354 10/30/20 0439 10/30/20 2205 10/31/20 0147  AST 30 38 38 27 25  ALT _1 ALKPHOS 59 62 50 52 50  BILITOT 0.8 0.5 0.7 0.2* 0.4  PROT 6.0* 5.9* 5.3* 5.1* 5.3*  ALBUMIN 3.3* 3.1* 2.9* 2.8* 2.8*   No results for input(s): LIPASE, AMYLASE in the last 168 hours. No results for input(s): AMMONIA in the last 168 hours. Coagulation Profile: Recent Labs  Lab 10/27/20 1505  INR 1.0   Cardiac Enzymes: No results for input(s): CKTOTAL, CKMB, CKMBINDEX, TROPONINI in the last 168 hours. BNP (last 3 results) No results for input(s): PROBNP in the last 8760 hours. HbA1C: No results for input(s): HGBA1C in the last 72 hours. CBG: Recent Labs  Lab 10/30/20 2128 10/31/20 0001 10/31/20 0424 10/31/20 0759 10/31/20 1141  GLUCAP 136* 136* 159* 149* 132*   Lipid Profile: No results for input(s): CHOL, HDL, LDLCALC, TRIG, CHOLHDL, LDLDIRECT in the last 72 hours. Thyroid Function Tests: No results for input(s): TSH, T4TOTAL, FREET4, T3FREE, THYROIDAB in the last 72  hours. Anemia Panel: Recent Labs    10/30/20 0439  VITAMINB12 2,122*   Sepsis Labs: Recent Labs  Lab 10/27/20 1605 10/27/20 1700 10/28/20 0101 10/28/20 0500 10/29/20 0354  PROCALCITON  --  0.54 0.75 0.65 0.48  LATICACIDVEN 1.5  --   --   --   --     Recent Results (from the past 240 hour(s))  Culture, blood (Routine x 2)     Status: None (Preliminary result)   Collection Time: 10/27/20  4:05 PM   Specimen: BLOOD  Result Value Ref Range Status   Specimen Description BLOOD LEFT UPPER ARM  Final   Special Requests   Final    BOTTLES DRAWN AEROBIC AND ANAEROBIC Blood Culture adequate volume   Culture   Final    NO GROWTH 4 DAYS Performed at [facility], [place] [address] [place], [place] [address]    Report Status PENDING  Incomplete      Radiology Studies: No results found.  Scheduled Meds: . enoxaparin (LOVENOX) injection  40 mg Subcutaneous Daily  . feeding supplement  237 mL Oral BID BM  . memantine  10 mg Oral BID  . methylPREDNISolone (SOLU-MEDROL) injection  40 mg Intravenous BID  . mirtazapine  7.5 mg Oral QHS  . pantoprazole  40 mg Oral Daily  . pyridostigmine  60 mg Oral TID  . QUEtiapine  12.5 mg Oral QHS   Continuous Infusions: . cefTRIAXone (ROCEPHIN)  IV 2 g (10/30/20 2202)  . doxycycline (VIBRAMYCIN) IV 100 mg (10/31/20 0037)     LOS: 4 days   Phillips Climes, MD [facility]  10/31/2020, 2:12 PM   To contact the attending provider between 7A-7P or the covering provider during after hours 7P-7A, please log into the web  site [email or web address].

## 2020-11-01 DIAGNOSIS — U071 COVID-19: Secondary | ICD-10-CM | POA: Diagnosis not present

## 2020-11-01 DIAGNOSIS — J9600 Acute respiratory failure, unspecified whether with hypoxia or hypercapnia: Secondary | ICD-10-CM | POA: Diagnosis not present

## 2020-11-01 DIAGNOSIS — G9340 Encephalopathy, unspecified: Secondary | ICD-10-CM | POA: Diagnosis not present

## 2020-11-01 LAB — CBC WITH DIFFERENTIAL/PLATELET
Abs Immature Granulocytes: 0.03 10*3/uL (ref 0.00–0.07)
Basophils Absolute: 0 10*3/uL (ref 0.0–0.1)
Basophils Relative: 0 %
Eosinophils Absolute: 0 10*3/uL (ref 0.0–0.5)
Eosinophils Relative: 0 %
HCT: 39.3 % (ref 36.0–46.0)
Hemoglobin: 13.4 g/dL (ref 12.0–15.0)
Immature Granulocytes: 0 %
Lymphocytes Relative: 10 %
Lymphs Abs: 0.7 10*3/uL (ref 0.7–4.0)
MCH: 29.6 pg (ref 26.0–34.0)
MCHC: 34.1 g/dL (ref 30.0–36.0)
MCV: 86.8 fL (ref 80.0–100.0)
Monocytes Absolute: 0.2 10*3/uL (ref 0.1–1.0)
Monocytes Relative: 3 %
Neutro Abs: 5.8 10*3/uL (ref 1.7–7.7)
Neutrophils Relative %: 87 %
Platelets: 201 10*3/uL (ref 150–400)
RBC: 4.53 MIL/uL (ref 3.87–5.11)
RDW: 13.2 % (ref 11.5–15.5)
WBC: 6.8 10*3/uL (ref 4.0–10.5)
nRBC: 0 % (ref 0.0–0.2)

## 2020-11-01 LAB — COMPREHENSIVE METABOLIC PANEL
ALT: 21 U/L (ref 0–44)
AST: 21 U/L (ref 15–41)
Albumin: 2.6 g/dL — ABNORMAL LOW (ref 3.5–5.0)
Alkaline Phosphatase: 53 U/L (ref 38–126)
Anion gap: 7 (ref 5–15)
BUN: 29 mg/dL — ABNORMAL HIGH (ref 8–23)
CO2: 21 mmol/L — ABNORMAL LOW (ref 22–32)
Calcium: 8.1 mg/dL — ABNORMAL LOW (ref 8.9–10.3)
Chloride: 112 mmol/L — ABNORMAL HIGH (ref 98–111)
Creatinine, Ser: 1.01 mg/dL — ABNORMAL HIGH (ref 0.44–1.00)
GFR, Estimated: 56 mL/min — ABNORMAL LOW (ref >60)
Glucose, Bld: 178 mg/dL — ABNORMAL HIGH (ref 70–99)
Potassium: 4.1 mmol/L (ref 3.5–5.1)
Sodium: 140 mmol/L (ref 135–145)
Total Bilirubin: 0.7 mg/dL (ref 0.3–1.2)
Total Protein: 5.1 g/dL — ABNORMAL LOW (ref 6.5–8.1)

## 2020-11-01 LAB — GLUCOSE, CAPILLARY
Glucose-Capillary: 136 mg/dL — ABNORMAL HIGH (ref 70–99)
Glucose-Capillary: 146 mg/dL — ABNORMAL HIGH (ref 70–99)
Glucose-Capillary: 151 mg/dL — ABNORMAL HIGH (ref 70–99)
Glucose-Capillary: 160 mg/dL — ABNORMAL HIGH (ref 70–99)
Glucose-Capillary: 185 mg/dL — ABNORMAL HIGH (ref 70–99)
Glucose-Capillary: 213 mg/dL — ABNORMAL HIGH (ref 70–99)

## 2020-11-01 LAB — CULTURE, BLOOD (ROUTINE X 2)
Culture: NO GROWTH
Special Requests: ADEQUATE

## 2020-11-01 LAB — C-REACTIVE PROTEIN: CRP: 1.8 mg/dL — ABNORMAL HIGH (ref <1.0)

## 2020-11-01 LAB — D-DIMER, QUANTITATIVE: D-Dimer, Quant: 0.48 ug/mL-FEU (ref 0.00–0.50)

## 2020-11-01 MED ORDER — METHYLPREDNISOLONE SODIUM SUCC 40 MG IJ SOLR
40.0000 mg | Freq: Every day | INTRAMUSCULAR | Status: DC
  Administered 2020-11-02: 40 mg via INTRAVENOUS
  Filled 2020-11-01: qty 1

## 2020-11-01 NOTE — TOC Progression Note (Signed)
Transition of Care [facility]) - Progression Note    Patient Details  Name: [name] [name] MRN: [id number] Date of Birth: [birth year]  Transition of Care [facility]) CM/SW Contact  Mearl Latin, LCSW Phone Number: 11/01/2020, 6:39 PM  Clinical Narrative:    CSW received return call from patient's daughter, Synetta Fail and discussed SNF recommendation. She is in agreement for SNF and understands the limited options due to COVID. Currently, neither [facility] nor [facility] have any bed availability. CSW will continue to follow.    Expected Discharge Plan: Skilled Nursing Facility Barriers to Discharge: Insurance Authorization,SNF Pending bed offer  Expected Discharge Plan and Services Expected Discharge Plan: Skilled Nursing Facility In-house Referral: Clinical Social Work Discharge Planning Services: CM Consult Post Acute Care Choice: Skilled Nursing Facility Living arrangements for the past 2 months: Single Family Home                                       Social Determinants of Health (SDOH) Interventions    Readmission Risk Interventions No flowsheet data found.

## 2020-11-01 NOTE — NC FL2 (Signed)
[place] MEDICAID FL2 LEVEL OF CARE SCREENING TOOL     IDENTIFICATION  Patient Name: [name] [name] Birthdate: Jan 28, 1936 Sex: female Admission Date (Current Location): 10/27/2020  [facility] and [place] Number:  Producer, television/film/video and Address:  [facility] [place], [address] [place], [place] [address]      Provider Number: [id number]  Attending Physician Name and Address:  Starleen Arms, MD  Relative Name and Phone Number:  Synetta Fail daughter, [phone]    Current Level of Care: Hospital Recommended Level of Care: Skilled Nursing Facility Prior Approval Number:    Date Approved/Denied:   PASRR Number: [id number]  Discharge Plan: SNF    Current Diagnoses: Patient Active Problem List   Diagnosis Date Noted  . Acute respiratory failure due to COVID-19 (HCC) 10/27/2020  . Acute encephalopathy 10/27/2020  . Hearing loss 05/14/2019  . Myasthenia gravis (HCC)   . Bilateral impacted cerumen 08/20/2017  . Fungal otitis externa 08/20/2017  . Right ear pain 08/20/2017  . Healthcare maintenance 08/05/2016  . Advance care planning 08/04/2015  . Vitamin D deficiency 08/04/2015  . Knee pain 10/25/2014  . Alzheimer's type dementia (HCC)   . Medicare annual wellness visit, subsequent 02/12/2012  . FIBROMYALGIA 05/10/2010  . Osteoporosis 05/10/2010  . ANXIETY 11/12/2009  . DEPRESSION 02/09/2009  . Myasthenia gravis without exacerbation (HCC) 02/09/2009  . GERD 02/09/2009    Orientation RESPIRATION BLADDER Height & Weight     Self  O2 (Nasal cannula 2L) Incontinent,External catheter Weight: 183 lb 3.2 oz (83.1 kg) Height:  5\' 7"  (170.2 cm)  BEHAVIORAL SYMPTOMS/MOOD NEUROLOGICAL BOWEL NUTRITION STATUS      Continent Diet (Please see dc summary)  AMBULATORY STATUS COMMUNICATION OF NEEDS Skin   Extensive Assist Verbally Normal                       Personal Care Assistance Level of Assistance  Bathing,Feeding,Dressing Bathing Assistance:  Maximum assistance Feeding assistance: Limited assistance Dressing Assistance: Limited assistance     Functional Limitations Info  Hearing,Sight Sight Info: Impaired Hearing Info: Impaired      SPECIAL CARE FACTORS FREQUENCY  PT (By licensed PT),OT (By licensed OT)     PT Frequency: 5x/week OT Frequency: 5x/week            Contractures Contractures Info: Not present    Additional Factors Info  Code Status,Allergies,Isolation Precautions,Psychotropic Code Status Info: Full Allergies Info: Alendronate Sodium, Donepezil Hcl, Vesicare (Solifenacin), Sulfa Antibiotics, Sulfasalazine, Tetanus Toxoids Psychotropic Info: Seroquel   Isolation Precautions Info: COVID+ 12/29     Current Medications (11/01/2020):  This is the current hospital active medication list Current Facility-Administered Medications  Medication Dose Route Frequency Provider Last Rate Last Admin  . acetaminophen (TYLENOL) tablet 650 mg  650 mg Oral Q6H PRN 12/30/2020, MD       Or  . acetaminophen (TYLENOL) suppository 650 mg  650 mg Rectal Q6H PRN Eduard Clos, MD      . enoxaparin (LOVENOX) injection 40 mg  40 mg Subcutaneous Daily Eduard Clos, MD   40 mg at 11/01/20 0917  . feeding supplement (ENSURE ENLIVE / ENSURE PLUS) liquid 237 mL  237 mL Oral BID BM Elgergawy, 12/30/20, MD   237 mL at 10/30/20 1349  . haloperidol lactate (HALDOL) injection 5 mg  5 mg Intravenous Q6H PRN 12/28/20, MD   5 mg at 10/28/20 2240  . memantine (NAMENDA) tablet 10 mg  10 mg Oral BID Hughie Closs, MD   10 mg at 11/01/20 0917  . [START ON 11/02/2020] methylPREDNISolone sodium succinate (SOLU-MEDROL) 40 mg/mL injection 40 mg  40 mg Intravenous Daily Elgergawy, Leana Roe, MD      . mirtazapine (REMERON) tablet 7.5 mg  7.5 mg Oral QHS Hughie Closs, MD   7.5 mg at 10/31/20 2101  . pantoprazole (PROTONIX) EC tablet 40 mg  40 mg Oral Daily Hughie Closs, MD   40 mg at 11/01/20 0917  . pyridostigmine  (MESTINON) tablet 60 mg  60 mg Oral TID Hughie Closs, MD   60 mg at 11/01/20 1717  . QUEtiapine (SEROQUEL) tablet 12.5 mg  12.5 mg Oral QHS Elgergawy, Leana Roe, MD   12.5 mg at 10/31/20 2102     Discharge Medications: Please see discharge summary for a list of discharge medications.  Relevant Imaging Results:  Relevant Lab Results:   Additional Information SSN: [id number] [place], [place]

## 2020-11-01 NOTE — Progress Notes (Signed)
PROGRESS NOTE    [name] [name]  CVE:[id number] DOB: 01/19/1936 DOA: 10/27/2020 PCP: Tonia Ghent, MD   Brief Narrative:   HPI: [name] [name] is a 85 y.o. female with known history of dementia and ocular myasthenia gravis was found to be increasingly confused by patient's daughter and was brought to the ER.  Per the report patient usually is ambulatory and able to dress herself.  ED Course: In the ER patient appears lethargic and labs reveal creatinine 1.2 CBC largely unremarkable with chest x-ray showing wedge-shaped defect and Covid test was positive.  Critical care was consulted and at this time they felt that patient was stable enough to be admitted to the floor under hospitalist.  Subsequent which patient underwent CT angiogram of the head and neck which does not show anything acute except for possible pneumonia and CT angiogram of the chest was negative for pulmonary embolism but did show consolidation concerning for pneumonia.  Patient was started on empiric antibiotics for community-acquired pneumonia and also Covid pneumonia.  Subjective: Patient seen and examined , second events overnight as discussed with staff, but diet has improved, patient herself denies any complaints today .  Assessment & Plan:   Principal Problem:   Acute respiratory failure due to COVID-19 [facility]) Active Problems:   Alzheimer's type dementia ([place])   Myasthenia gravis ([place])   Acute encephalopathy   Acute hypoxic respiratory failure and sepsis secondary to COVID-19 pneumonia with superimposed possible bacterial pneumonia: - she is unvaccinated -  Patient met sepsis criteria based on the source of infection/pneumonia with tachycardia and tachypnea.  -She is with minimal oxygen requirement, she has been fluctuating between 0-2 L nasal cannula, this morning she is 98% on 2 L oxygen -Treated with Rocephin, doxycycline for presumed bacterial pneumonia -Treated with Remdesivir - continue with IV  Solu-Medrol.  We will start tapering off - procalcitonin 0.48, she is on Doxycycline and rocephine - CT angiogram of the chest ruled out PE.  Patient was evaluated by PCCM. - Patient was encouraged to use incentive spirometry and flutter valve.  Acute Metabolic  encephalopathy:  -Patient with underlying Alzheimer's dementia, continue with home medication . -She is with significant delirium at this point, this is most likely related hospital delirium and COVID-19 encephalopathy.   -Significantly improved, continue with low dose Eliquis nightly. - MRI, CT angiogram of head and neck all are negative for any acute pathology.  Case was discussed with neurology by admitting hospitalist.  History of myasthenia gravis:  - Patient is able to open both eyes without any trouble.  She does not seem to have any respiratory trouble indicating acute exacerbation of myasthenia gravis.  She was also evaluated by PCCM yesterday.  Continue NIF.  Resume pyridostigmine.  History of Alzheimer's dementia:  - ON  Namenda.  DVT prophylaxis: enoxaparin (LOVENOX) injection 40 mg Start: 10/28/20 1000   Code Status: Full Code  Family Communication: Discussed  with husband and daughter Rodena Piety by phone 10/29/2020, 10/31/2020  Status is: Inpatient  Remains inpatient appropriate because:Inpatient level of care appropriate due to severity of illness   Dispo: The patient is from: Home              Anticipated d/c is to: SNF              Anticipated d/c date is: 2 days              Patient currently is medically stable to d/c.  Can go to SNF  when bed is available        Estimated body mass index is 28.69 kg/m as calculated from the following:   Height as of this encounter: _0  (1.702 m).   Weight as of this encounter: 83.1 kg.      Nutritional status:   Consultants:   None  Procedures:   None  Antimicrobials:  Anti-infectives (From admission, onward)   Start     Dose/Rate Route Frequency Ordered  Stop   10/28/20 1000  remdesivir 100 mg in sodium chloride 0.9 % 100 mL IVPB       "Followed by" Linked Group Details   100 mg 200 mL/hr over 30 Minutes Intravenous Daily 10/27/20 2244 10/31/20 1244   10/27/20 2245  remdesivir 200 mg in sodium chloride 0.9% 250 mL IVPB       "Followed by" Linked Group Details   200 mg 580 mL/hr over 30 Minutes Intravenous Once 10/27/20 2244 10/28/20 0107   10/27/20 2245  cefTRIAXone (ROCEPHIN) 2 g in sodium chloride 0.9 % 100 mL IVPB  Status:  Discontinued        2 g 200 mL/hr over 30 Minutes Intravenous Every 24 hours 10/27/20 2244 11/01/20 0931   10/27/20 2245  doxycycline (VIBRAMYCIN) 100 mg in sodium chloride 0.9 % 250 mL IVPB  Status:  Discontinued        100 mg 125 mL/hr over 120 Minutes Intravenous Every 12 hours 10/27/20 2244 11/01/20 0931          Objective: Vitals:   11/01/20 0000 11/01/20 0400 11/01/20 0751 11/01/20 1227  BP: 124/72 103/66 136/73 (!) 149/128  Pulse: 60 (!) 55 (!) 58 74  Resp: _1 Temp: (!) 97.5 F (36.4 C) (!) 97.5 F (36.4 C) 98.4 F (36.9 C) 98.2 F (36.8 C)  TempSrc: Axillary Axillary Axillary Oral  SpO2:  94% 91% 97%  Weight:      Height:        Intake/Output Summary (Last 24 hours) at 11/01/2020 1504 Last data filed at 11/01/2020 1100 Gross per 24 hour  Intake 120 ml  Output 900 ml  Net -780 ml   Filed Weights   10/28/20 2122  Weight: 83.1 kg    Examination:  Awake Alert, gently confused and demented, she is more appropriate and conversant today, pleasant Symmetrical Chest wall movement, Good air movement bilaterally, CTAB RRR,No Gallops,Rubs or new Murmurs, No Parasternal Heave +ve B.Sounds, Abd Soft, No tenderness, No rebound - guarding or rigidity. No Cyanosis, Clubbing or edema, No new Rash or bruise      Data Reviewed: I have personally reviewed following labs and imaging studies  CBC: Recent Labs  Lab 10/28/20 0500 10/28/20 0901 10/29/20 0354 10/30/20 0439 10/30/20 2205  10/31/20 0147 11/01/20 0332  WBC 4.9  --  6.6 6.1 8.7 7.3 6.8  NEUTROABS 4.1  --  5.7 5.2  --  6.2 5.8  HGB 15.3*   < > 14.1 13.2 13.4 13.2 13.4  HCT 48.9*   < > 43.7 40.9 41.1 41.7 39.3  MCV 92.1  --  88.6 88.5 88.6 90.3 86.8  PLT 138*  --  175 203 200 195 201   < > = values in this interval not displayed.   Basic Metabolic Panel: Recent Labs  Lab 10/29/20 0354 10/30/20 0439 10/30/20 2205 10/31/20 0147 11/01/20 0332  NA 142 141 143 144 140  K 4.0 3.8 4.2 4.5 4.1  CL 108 110 112* 110 112*  CO2 20*  _0 21*  GLUCOSE 144* 162* 129* 151* 178*  BUN 24* 27* 32* 31* 29*  CREATININE 1.26* 1.22* 1.09* 1.15* 1.01*  CALCIUM 8.3* 8.1* 8.2* 8.3* 8.1*   GFR: Estimated Creatinine Clearance: 47.6 mL/min (A) (by C-G formula based on SCr of 1.01 mg/dL (H)). Liver Function Tests: Recent Labs  Lab 10/29/20 0354 10/30/20 0439 10/30/20 2205 10/31/20 0147 11/01/20 0332  AST 38 38 _1 ALT _2 ALKPHOS 62 50 52 50 53  BILITOT 0.5 0.7 0.2* 0.4 0.7  PROT 5.9* 5.3* 5.1* 5.3* 5.1*  ALBUMIN 3.1* 2.9* 2.8* 2.8* 2.6*   No results for input(s): LIPASE, AMYLASE in the last 168 hours. No results for input(s): AMMONIA in the last 168 hours. Coagulation Profile: Recent Labs  Lab 10/27/20 1505  INR 1.0   Cardiac Enzymes: No results for input(s): CKTOTAL, CKMB, CKMBINDEX, TROPONINI in the last 168 hours. BNP (last 3 results) No results for input(s): PROBNP in the last 8760 hours. HbA1C: No results for input(s): HGBA1C in the last 72 hours. CBG: Recent Labs  Lab 10/31/20 1941 11/01/20 0011 11/01/20 0420 11/01/20 0756 11/01/20 1232  GLUCAP 197* 151* 160* 136* 213*   Lipid Profile: No results for input(s): CHOL, HDL, LDLCALC, TRIG, CHOLHDL, LDLDIRECT in the last 72 hours. Thyroid Function Tests: No results for input(s): TSH, T4TOTAL, FREET4, T3FREE, THYROIDAB in the last 72 hours. Anemia Panel: Recent Labs    10/30/20 0439  VITAMINB12 2,122*   Sepsis  Labs: Recent Labs  Lab 10/27/20 1605 10/27/20 1700 10/28/20 0101 10/28/20 0500 10/29/20 0354  PROCALCITON  --  0.54 0.75 0.65 0.48  LATICACIDVEN 1.5  --   --   --   --     Recent Results (from the past 240 hour(s))  Culture, blood (Routine x 2)     Status: None   Collection Time: 10/27/20  4:05 PM   Specimen: BLOOD  Result Value Ref Range Status   Specimen Description BLOOD LEFT UPPER ARM  Final   Special Requests   Final    BOTTLES DRAWN AEROBIC AND ANAEROBIC Blood Culture adequate volume   Culture   Final    NO GROWTH 5 DAYS Performed at [facility], [place] [address], [place], [place] [address]    Report Status 11/01/2020 FINAL  Final      Radiology Studies: No results found.  Scheduled Meds: . enoxaparin (LOVENOX) injection  40 mg Subcutaneous Daily  . feeding supplement  237 mL Oral BID BM  . memantine  10 mg Oral BID  . methylPREDNISolone (SOLU-MEDROL) injection  40 mg Intravenous BID  . mirtazapine  7.5 mg Oral QHS  . pantoprazole  40 mg Oral Daily  . pyridostigmine  60 mg Oral TID  . QUEtiapine  12.5 mg Oral QHS   Continuous Infusions:    LOS: 5 days   Phillips Climes, MD [facility]  11/01/2020, 3:04 PM   To contact the attending provider between 7A-7P or the covering provider during after hours 7P-7A, please log into the web site [email or web address].

## 2020-11-01 NOTE — TOC Progression Note (Signed)
Transition of Care [facility]) - Progression Note    Patient Details  Name: ALLENA PIETILA MRN: [id number] Date of Birth: Jun 13, 1936  [facility] Care [facility]) CM/SW Contact  Mearl Latin, LCSW Phone Number: 11/01/2020, 9:09 AM  Clinical Narrative:    CSW left voicemails for patient's spouse and daughter, Synetta Fail, to discuss discharge plan.    Expected Discharge Plan: Skilled Nursing Facility Barriers to Discharge: Continued Medical Work up  Expected Discharge Plan and Services Expected Discharge Plan: Skilled Nursing Facility In-house Referral: Clinical Social Work Discharge Planning Services: CM Consult   Living arrangements for the past 2 months: Single Family Home                                       Social Determinants of Health (SDOH) Interventions    Readmission Risk Interventions No flowsheet data found.

## 2020-11-02 DIAGNOSIS — U071 COVID-19: Secondary | ICD-10-CM | POA: Diagnosis not present

## 2020-11-02 DIAGNOSIS — J9600 Acute respiratory failure, unspecified whether with hypoxia or hypercapnia: Secondary | ICD-10-CM | POA: Diagnosis not present

## 2020-11-02 LAB — GLUCOSE, CAPILLARY
Glucose-Capillary: 105 mg/dL — ABNORMAL HIGH (ref 70–99)
Glucose-Capillary: 156 mg/dL — ABNORMAL HIGH (ref 70–99)
Glucose-Capillary: 248 mg/dL — ABNORMAL HIGH (ref 70–99)
Glucose-Capillary: 215 mg/dL — ABNORMAL HIGH (ref 70–99)

## 2020-11-02 MED ORDER — DEXAMETHASONE 6 MG PO TABS
6.0000 mg | ORAL_TABLET | Freq: Every day | ORAL | Status: AC
  Administered 2020-11-03 – 2020-11-06 (×4): 6 mg via ORAL
  Filled 2020-11-02 (×4): qty 1

## 2020-11-02 NOTE — TOC Progression Note (Signed)
Transition of Care [facility]) - Progression Note    Patient Details  Name: [name] [name] MRN: [id number] Date of Birth: October 19, 1936  Transition of Care [facility]) CM/SW Contact  Mearl Latin, LCSW Phone Number: 11/02/2020, 5:51 PM  Clinical Narrative:    There are currently no COVID SNF beds available. Will continue to follow.    Expected Discharge Plan: Skilled Nursing Facility Barriers to Discharge: Insurance Authorization,SNF Pending bed offer  Expected Discharge Plan and Services Expected Discharge Plan: Skilled Nursing Facility In-house Referral: Clinical Social Work Discharge Planning Services: CM Consult Post Acute Care Choice: Skilled Nursing Facility Living arrangements for the past 2 months: Single Family Home                                       Social Determinants of Health (SDOH) Interventions    Readmission Risk Interventions No flowsheet data found.

## 2020-11-02 NOTE — Progress Notes (Signed)
PROGRESS NOTE    [name] [name]  DZH:[id number] DOB: 10-30-1936 DOA: 10/27/2020 PCP: Tonia Ghent, MD   Brief Narrative:   [name] [name] is a 85 y.o. female with known history of dementia and ocular myasthenia gravis was found to be increasingly confused by patient's daughter and was brought to the ER.  Per the report patient usually is ambulatory and able to dress herself.  Patient is lethargic, and confused on presentation, work-up for encephalopathy is negative, her work-up was significant for Covid pneumonia, and metabolic encephalopathy in the setting of Covid severe hospital delirium with underlining advanced dementia.  Subjective:  Patient seen and examined , no significant events overnight as discussed with staff, has been pleasant, appetite has improved, she herself denies any complaints.   Assessment & Plan:   Principal Problem:   Acute respiratory failure due to COVID-19 [facility]) Active Problems:   Alzheimer's type dementia ([place])   Myasthenia gravis ([place])   Acute encephalopathy   Acute hypoxic respiratory failure and sepsis secondary to COVID-19 pneumonia with superimposed possible bacterial pneumonia: - she is unvaccinated -  Patient met sepsis criteria based on the source of infection/pneumonia with tachycardia and tachypnea.  -She is with minimal oxygen requirement, she has been fluctuating between 0-2 L nasal cannula, all she is saturating above 98% on 0-2L oxygen -Treated with Rocephin, doxycycline for presumed bacterial pneumonia -Treated with Remdesivir -Usually with IV Solu-Medrol, currently transitioned to Decadron. - procalcitonin 0.48, she was treated with doxycycline and rocephine - CT angiogram of the chest ruled out PE.  Patient was evaluated by PCCM. - Patient was encouraged to use incentive spirometry and flutter valve.  SpO2: 99 % O2 Flow Rate (L/min): 2 L/min    Acute Metabolic  encephalopathy:  -Patient with underlying Alzheimer's dementia,  continue with home medication . -She is with significant delirium at this point, this is most likely related hospital delirium and COVID-19 encephalopathy.   -Significantly improved, continue with low dose Eliquis nightly. - MRI, CT angiogram of head and neck all are negative for any acute pathology.  Case was discussed with neurology by admitting hospitalist.  History of myasthenia gravis:  - Patient is able to open both eyes without any trouble.  She does not seem to have any respiratory trouble indicating acute exacerbation of myasthenia gravis.  She was also evaluated by PCCM yesterday.  Continue NIF.  Resume pyridostigmine.  History of Alzheimer's dementia:  - ON  Namenda.  DVT prophylaxis: enoxaparin (LOVENOX) injection 40 mg Start: 10/28/20 1000   Code Status: Full Code  Family Communication: Discussed  with husband and daughter Rodena Piety by phone 10/29/2020, 10/31/2020,  11/02/2020  Status is: Inpatient  Remains inpatient appropriate because:Inpatient level of care appropriate due to severity of illness   Dispo: The patient is from: Home              Anticipated d/c is to: SNF              Anticipated d/c date is: 1 day              Patient currently is medically stable to d/c.  Can go to SNF when bed is available        Estimated body mass index is 28.69 kg/m as calculated from the following:   Height as of this encounter: $RemoveBeforeD'5\' 7"'[id number]$  (1.702 m).   Weight as of this encounter: 83.1 kg.      Nutritional status:   Consultants:   None  Procedures:   None  Antimicrobials:  Anti-infectives (From admission, onward)   Start     Dose/Rate Route Frequency Ordered Stop   10/28/20 1000  remdesivir 100 mg in sodium chloride 0.9 % 100 mL IVPB       "Followed by" Linked Group Details   100 mg 200 mL/hr over 30 Minutes Intravenous Daily 10/27/20 2244 10/31/20 1244   10/27/20 2245  remdesivir 200 mg in sodium chloride 0.9% 250 mL IVPB       "Followed by" Linked Group Details    200 mg 580 mL/hr over 30 Minutes Intravenous Once 10/27/20 2244 10/28/20 0107   10/27/20 2245  cefTRIAXone (ROCEPHIN) 2 g in sodium chloride 0.9 % 100 mL IVPB  Status:  Discontinued        2 g 200 mL/hr over 30 Minutes Intravenous Every 24 hours 10/27/20 2244 11/01/20 0931   10/27/20 2245  doxycycline (VIBRAMYCIN) 100 mg in sodium chloride 0.9 % 250 mL IVPB  Status:  Discontinued        100 mg 125 mL/hr over 120 Minutes Intravenous Every 12 hours 10/27/20 2244 11/01/20 0931          Objective: Vitals:   11/02/20 0006 11/02/20 0411 11/02/20 0727 11/02/20 1220  BP: 127/72 (!) 126/92 (!) 134/94 132/88  Pulse: 65 96 94 98  Resp: _0 Temp: 98.3 F (36.8 C) 98.3 F (36.8 C) 98 F (36.7 C) 98 F (36.7 C)  TempSrc: Axillary Axillary Oral Oral  SpO2: 99% 96% 99% 99%  Weight:      Height:        Intake/Output Summary (Last 24 hours) at 11/02/2020 1423 Last data filed at 11/02/2020 0400 Gross per 24 hour  Intake 120 ml  Output 1100 ml  Net -980 ml   Filed Weights   10/28/20 2122  Weight: 83.1 kg    Examination:  He is awake, alert, pleasant today, demented and confused at baseline, carry conversation, follows simple commands, but unable to answer questions appropriately . Symmetrical Chest wall movement, Good air movement bilaterally, CTAB RRR,No Gallops,Rubs or new Murmurs, No Parasternal Heave +ve B.Sounds, Abd Soft, No tenderness, No rebound - guarding or rigidity. No Cyanosis, Clubbing or edema, No new Rash or bruise      Data Reviewed: I have personally reviewed following labs and imaging studies  CBC: Recent Labs  Lab 10/28/20 0500 10/28/20 0901 10/29/20 0354 10/30/20 0439 10/30/20 2205 10/31/20 0147 11/01/20 0332  WBC 4.9  --  6.6 6.1 8.7 7.3 6.8  NEUTROABS 4.1  --  5.7 5.2  --  6.2 5.8  HGB 15.3*   < > 14.1 13.2 13.4 13.2 13.4  HCT 48.9*   < > 43.7 40.9 41.1 41.7 39.3  MCV 92.1  --  88.6 88.5 88.6 90.3 86.8  PLT 138*  --  175 203 200 195 201    < > = values in this interval not displayed.   Basic Metabolic Panel: Recent Labs  Lab 10/29/20 0354 10/30/20 0439 10/30/20 2205 10/31/20 0147 11/01/20 0332  NA 142 141 143 144 140  K 4.0 3.8 4.2 4.5 4.1  CL 108 110 112* 110 112*  CO2 20* _1 21*  GLUCOSE 144* 162* 129* 151* 178*  BUN 24* 27* 32* 31* 29*  CREATININE 1.26* 1.22* 1.09* 1.15* 1.01*  CALCIUM 8.3* 8.1* 8.2* 8.3* 8.1*   GFR: Estimated Creatinine Clearance: 47.6 mL/min (A) (by C-G formula based on SCr of 1.01 mg/dL (H)).  Liver Function Tests: Recent Labs  Lab 10/29/20 0354 10/30/20 0439 10/30/20 2205 10/31/20 0147 11/01/20 0332  AST 38 38 _0 ALT _1 ALKPHOS 62 50 52 50 53  BILITOT 0.5 0.7 0.2* 0.4 0.7  PROT 5.9* 5.3* 5.1* 5.3* 5.1*  ALBUMIN 3.1* 2.9* 2.8* 2.8* 2.6*   No results for input(s): LIPASE, AMYLASE in the last 168 hours. No results for input(s): AMMONIA in the last 168 hours. Coagulation Profile: Recent Labs  Lab 10/27/20 1505  INR 1.0   Cardiac Enzymes: No results for input(s): CKTOTAL, CKMB, CKMBINDEX, TROPONINI in the last 168 hours. BNP (last 3 results) No results for input(s): PROBNP in the last 8760 hours. HbA1C: No results for input(s): HGBA1C in the last 72 hours. CBG: Recent Labs  Lab 11/01/20 1232 11/01/20 1632 11/01/20 2040 11/02/20 0725 11/02/20 1219  GLUCAP 213* 185* 146* 105* 156*   Lipid Profile: No results for input(s): CHOL, HDL, LDLCALC, TRIG, CHOLHDL, LDLDIRECT in the last 72 hours. Thyroid Function Tests: No results for input(s): TSH, T4TOTAL, FREET4, T3FREE, THYROIDAB in the last 72 hours. Anemia Panel: No results for input(s): VITAMINB12, FOLATE, FERRITIN, TIBC, IRON, RETICCTPCT in the last 72 hours. Sepsis Labs: Recent Labs  Lab 10/27/20 1605 10/27/20 1700 10/28/20 0101 10/28/20 0500 10/29/20 0354  PROCALCITON  --  0.54 0.75 0.65 0.48  LATICACIDVEN 1.5  --   --   --   --     Recent Results (from the past 240 hour(s))   Culture, blood (Routine x 2)     Status: None   Collection Time: 10/27/20  4:05 PM   Specimen: BLOOD  Result Value Ref Range Status   Specimen Description BLOOD LEFT UPPER ARM  Final   Special Requests   Final    BOTTLES DRAWN AEROBIC AND ANAEROBIC Blood Culture adequate volume   Culture   Final    NO GROWTH 5 DAYS Performed at [facility], [place] [address], [place], [place] [address]    Report Status 11/01/2020 FINAL  Final      Radiology Studies: No results found.  Scheduled Meds: . enoxaparin (LOVENOX) injection  40 mg Subcutaneous Daily  . feeding supplement  237 mL Oral BID BM  . memantine  10 mg Oral BID  . methylPREDNISolone (SOLU-MEDROL) injection  40 mg Intravenous Daily  . mirtazapine  7.5 mg Oral QHS  . pantoprazole  40 mg Oral Daily  . pyridostigmine  60 mg Oral TID  . QUEtiapine  12.5 mg Oral QHS   Continuous Infusions:    LOS: 6 days   Phillips Climes, MD [facility]  11/02/2020, 2:23 PM   To contact the attending provider between 7A-7P or the covering provider during after hours 7P-7A, please log into the web site [email or web address].

## 2020-11-02 NOTE — Progress Notes (Signed)
Physical Therapy Treatment Patient Details Name: [name] [name] MRN: [id number] DOB: 11-Apr-1936 Today's Date: 11/02/2020    History of Present Illness 85 y.o. female with known history of dementia and ocular myasthenia gravis was found to be increasingly confused by patient's daughter and was brought to the ER 12/29/21where she was found to be COVID positive, chest X-ray concerning for PNA. Admitted acute hypoxic respiratory failure with sepsis secondary to COVID PNA, and acute metabolic encephalopathy requiring sitter.    PT Comments    Pt slid down in recliner asleep on entry. Once awaken, pt requires total A to scoot her back in recliner to be able to safely put her legs down. Pt continues to be very confused and has difficulty with command following even with multimodal cues. Pt responds to questions appropriately about 20% of the time, at times responding with nonsensical responses with language like intonation. Pt requires maximal encouragement and totalA to come to standing in front of the recliner. Pt's Purewick found to be not working so pt required total A for standing a second time to place clean linens. Unable to convince pt to takes steps once in standing. Pt eventually sits back in recliner. Pt able to reports she is hungry. Provided set up for lunch and pt able to eat bread and chicken. D/c plans remain appropriate at this time. PT will continue to follow acutely.   Follow Up Recommendations  SNF     Equipment Recommendations  Wheelchair (measurements PT);Wheelchair cushion (measurements PT);Hospital bed       Precautions / Restrictions Precautions Precautions: Fall Restrictions Weight Bearing Restrictions: No    Mobility  Bed Mobility               General bed mobility comments: sitting in recliner on entry, slid way down in the chair requiring scooting back in chair before lowering foot of recliner  Transfers Overall transfer level: Needs assistance Equipment  used: Rolling walker (2 wheeled) Transfers: Sit to/from [employer] Transfers Sit to Stand: +2 physical assistance;Total assist;+2 safety/equipment         General transfer comment: pt is total A to come to standing, once in standing pt seat found to be wet as Purewick not working correctly, had pt stand a second time to replace linens and Purewick  Ambulation/Gait             General Gait Details: refusing to take steps away from chair and abruptly sitting back in recliner.         Balance Overall balance assessment: Needs assistance Sitting-balance support: Bilateral upper extremity supported;Feet supported Sitting balance-Leahy Scale: Fair     Standing balance support: Bilateral upper extremity supported;During functional activity Standing balance-Leahy Scale: Poor Standing balance comment: reliant on external support and staff assist                            Cognition Arousal/Alertness: Awake/alert Behavior During Therapy: WFL for tasks assessed/performed Overall Cognitive Status: No family/caregiver present to determine baseline cognitive functioning                                 General Comments: Pt with history of dementia. Very confused today and unable to follow commands without multimodal cues, providing nonsense answers to question      Exercises      General Comments General comments (skin integrity, edema, etc.): VSS on  RA      Pertinent Vitals/Pain Pain Assessment: Faces Faces Pain Scale: No hurt           PT Goals (current goals can now be found in the care plan section) Acute Rehab PT Goals PT Goal Formulation: With patient Time For Goal Achievement: 11/13/20 Potential to Achieve Goals: Fair Progress towards PT goals: Not progressing toward goals - comment (limited by cognition)    Frequency    Min 2X/week      PT Plan Current plan remains appropriate    Co-evaluation PT/OT/SLP  Co-Evaluation/Treatment: Yes Reason for Co-Treatment: For patient/therapist safety PT goals addressed during session: Mobility/safety with mobility        AM-PAC PT "6 Clicks" Mobility   Outcome Measure  Help needed turning from your back to your side while in a flat bed without using bedrails?: A Lot Help needed moving from lying on your back to sitting on the side of a flat bed without using bedrails?: A Lot Help needed moving to and from a bed to a chair (including a wheelchair)?: A Lot Help needed standing up from a chair using your arms (e.g., wheelchair or bedside chair)?: A Lot Help needed to walk in hospital room?: A Lot Help needed climbing 3-5 steps with a railing? : Total 6 Click Score: 11    End of Session   Activity Tolerance: Patient tolerated treatment well Patient left: in chair;with call bell/phone within reach;with chair alarm set Nurse Communication: Mobility status PT Visit Diagnosis: Unsteadiness on feet (R26.81);Other abnormalities of gait and mobility (R26.89);Muscle weakness (generalized) (M62.81);Difficulty in walking, not elsewhere classified (R26.2)     Time: 5732-2025 PT Time Calculation (min) (ACUTE ONLY): 27 min  Charges:  $Therapeutic Activity: 8-22 mins                     Lafawn Lenoir B. Beverely Risen PT, DPT Acute Rehabilitation Services Pager [phone] Office [phone]    Elon Alas [facility] 11/02/2020, 4:34 PM

## 2020-11-02 NOTE — Care Management Important Message (Signed)
Important Message  Patient Details  Name: [name] [name] MRN: [id number] Date of Birth: 03/30/1936   Medicare Important Message Given:  Yes - Important Message mailed due to current National Emergency   Verbal consent obtained due to current National Emergency  Relationship to patient: Spouse/Significant Other Contact Name: Laranda Burkemper Call Date: 11/02/20  Time: 1105 Phone: [phone] Outcome: No Answer/Busy Important Message mailed to: Patient address on file    Orson Aloe 11/02/2020, 11:06 AM

## 2020-11-02 NOTE — Evaluation (Addendum)
Occupational Therapy Evaluation Patient Details Name: [name] [name] MRN: [id number] DOB: 04-Nov-1935 Today's Date: 11/02/2020    History of Present Illness 85 y.o. female with known history of dementia and ocular myasthenia gravis was found to be increasingly confused by patient's daughter and was brought to the ER 12/29/21where she was found to be COVID positive, chest X-ray concerning for PNA. Admitted acute hypoxic respiratory failure with sepsis secondary to COVID PNA, and acute metabolic encephalopathy requiring sitter.   Clinical Impression   Pt with slow progress towards OT goals. She presents seated in recliner, pleasant but notably confused. Pt often with nonsensical speech and with poor command follow, requiring multimodal cues for carryover of simple commands during mobility/functional tasks. Pt tolerating x2 sit<>stand from recliner, requiring two person assist for safe completion of mobility and up to totalA (+2) for LB ADL. Unable to progress mobility beyond standing as pt impulsive/preemptively returning herself to sitting each bout of standing. Current SNF recommendation remains appropriate at this time. Will continue to follow acutely to progress pt towards established OT goals.     Follow Up Recommendations  SNF;Supervision/Assistance - 24 hour    Equipment Recommendations  Wheelchair (measurements OT);Wheelchair cushion (measurements OT);Hospital bed           Precautions / Restrictions Precautions Precautions: Fall Restrictions Weight Bearing Restrictions: No      Mobility Bed Mobility               General bed mobility comments: sitting in recliner on entry, slid way down in the chair requiring scooting back in chair before lowering foot of recliner    Transfers Overall transfer level: Needs assistance Equipment used: Rolling walker (2 wheeled) Transfers: Sit to/from Stand Sit to Stand: +2 physical assistance;Total assist;+2 safety/equipment          General transfer comment: pt is total A to come to standing, once in standing pt seat found to be wet as Purewick not working correctly, had pt stand a second time to replace linens and Purewick    Balance Overall balance assessment: Needs assistance Sitting-balance support: Bilateral upper extremity supported;Feet supported Sitting balance-Leahy Scale: Fair     Standing balance support: Bilateral upper extremity supported;During functional activity Standing balance-Leahy Scale: Poor Standing balance comment: reliant on external support and staff assist                           ADL either performed or assessed with clinical judgement   ADL Overall ADL's : Needs assistance/impaired Eating/Feeding: Set up;Sitting Eating/Feeding Details (indicate cue type and reason): once setup with lunch tray end of session pt initiating self-feeding task on her own. completing while seated in recliner                 Lower Body Dressing: +2 for physical assistance;+2 for safety/equipment;Sit to/from stand;Total assistance Lower Body Dressing Details (indicate cue type and reason): to don new pair of mesh underwear             Functional mobility during ADLs: Total assistance;Maximal assistance;+2 for physical assistance;+2 for safety/equipment;Rolling walker (sit<>stand)       Vision         Perception     Praxis      Pertinent Vitals/Pain Pain Assessment: Faces Faces Pain Scale: No hurt Pain Intervention(s): Monitored during session     Hand Dominance     Extremity/Trunk Assessment  Communication     Cognition Arousal/Alertness: Awake/alert Behavior During Therapy: WFL for tasks assessed/performed Overall Cognitive Status: No family/caregiver present to determine baseline cognitive functioning                                 General Comments: Pt with history of dementia. Very confused today and unable to follow commands  without multimodal cues, providing nonsensical answers to question and often with nonsensical speech   General Comments  VSS on RA    Exercises     Shoulder Instructions      Home Living                                          Prior Functioning/Environment                   OT Problem List:        OT Treatment/Interventions:      OT Goals(Current goals can be found in the care plan section) Acute Rehab OT Goals Patient Stated Goal: sit by the window in the sun OT Goal Formulation: With patient Time For Goal Achievement: 11/13/20 Potential to Achieve Goals: Good ADL Goals Pt Will Perform Grooming: with min assist;standing Pt Will Perform Lower Body Dressing: with min assist;sitting/lateral leans;sit to/from stand Pt Will Transfer to Toilet: with min assist;ambulating;regular height toilet;grab bars Additional ADL Goal #1: Pt will follow one step ADL commands with min VC's  OT Frequency: Min 2X/week   Barriers to D/C:            Co-evaluation PT/OT/SLP Co-Evaluation/Treatment: Yes Reason for Co-Treatment: For patient/therapist safety;To address functional/ADL transfers PT goals addressed during session: Mobility/safety with mobility OT goals addressed during session: ADL's and self-care      AM-PAC OT "6 Clicks" Daily Activity     Outcome Measure Help from another person eating meals?: A Little Help from another person taking care of personal grooming?: A Little Help from another person toileting, which includes using toliet, bedpan, or urinal?: Total Help from another person bathing (including washing, rinsing, drying)?: A Lot Help from another person to put on and taking off regular upper body clothing?: A Little Help from another person to put on and taking off regular lower body clothing?: Total 6 Click Score: 13   End of Session Equipment Utilized During Treatment: Rolling walker;Gait belt Nurse Communication: Mobility  status  Activity Tolerance: Patient tolerated treatment well Patient left: in chair;with call bell/phone within reach;with chair alarm set  OT Visit Diagnosis: Unsteadiness on feet (R26.81);Other abnormalities of gait and mobility (R26.89);Muscle weakness (generalized) (M62.81);Other symptoms and signs involving cognitive function                Time: 1434-1501 OT Time Calculation (min): 27 min Charges:  OT General Charges $OT Visit: 1 Visit OT Treatments $Self Care/Home Management : 8-22 mins  Marcy Siren, OT Acute Rehabilitation Services Pager [phone] Office [phone]  Orlando Penner 11/02/2020, 5:09 PM

## 2020-11-03 DIAGNOSIS — G309 Alzheimer's disease, unspecified: Secondary | ICD-10-CM | POA: Diagnosis not present

## 2020-11-03 DIAGNOSIS — F0281 Dementia in other diseases classified elsewhere with behavioral disturbance: Secondary | ICD-10-CM | POA: Diagnosis not present

## 2020-11-03 DIAGNOSIS — G7000 Myasthenia gravis without (acute) exacerbation: Secondary | ICD-10-CM | POA: Diagnosis not present

## 2020-11-03 LAB — GLUCOSE, CAPILLARY
Glucose-Capillary: 113 mg/dL — ABNORMAL HIGH (ref 70–99)
Glucose-Capillary: 149 mg/dL — ABNORMAL HIGH (ref 70–99)
Glucose-Capillary: 250 mg/dL — ABNORMAL HIGH (ref 70–99)
Glucose-Capillary: 194 mg/dL — ABNORMAL HIGH (ref 70–99)

## 2020-11-03 LAB — CREATININE, SERUM
Creatinine, Ser: 1.04 mg/dL — ABNORMAL HIGH (ref 0.44–1.00)
GFR, Estimated: 54 mL/min — ABNORMAL LOW (ref >60)

## 2020-11-03 NOTE — Progress Notes (Addendum)
PROGRESS NOTE    [name] [name]  [id number]:[id number] DOB: [birth year] DOA: 10/27/2020 PCP: Tonia Ghent, MD   Brief Narrative:   [name] [name] is a 85 y.o. female with known history of dementia and ocular myasthenia gravis was found to be increasingly confused by patient's daughter and was brought to the ER.  Per the report patient usually is ambulatory and able to dress herself.  Patient is lethargic, and confused on presentation, work-up for encephalopathy is negative, her work-up was significant for Covid pneumonia, and metabolic encephalopathy in the setting of Covid severe hospital delirium with underlining advanced dementia.  Subjective:  Patient noted to be pleasantly confused. Denies any complaints. Discussed with nursing staff. No overnight issues.   Assessment & Plan:  Pneumonia due to COVID-19/acute respiratory failure with hypoxia  Patient is unvaccinated against COVID-19. Patient met sepsis criteria based on the source of infection/pneumonia with tachycardia and tachypnea.  She however had minimal oxygen requirements. Currently saturating normal on room air. Patient was treated with ceftriaxone and doxycycline for presumed bacterial pneumonia. Also treated with Remdesivir and steroids. Did not require Actemra or baricitinib. CT angiogram ruled out pulmonary embolism. Currently on dexamethasone.  Respiratory status is stable.   Acute Metabolic  encephalopathy:  -Patient with underlying Alzheimer's dementia -Patient noted to have significant delirium during this hospital stay. Appears to have improved.  - MRI, CT angiogram of head and neck all are negative for any acute pathology.  Case was discussed with neurology by admitting hospitalist.  History of myasthenia gravis:  Stable. Pyridostigmine was resumed.  History of Alzheimer's dementia:  Continue home medication   DVT prophylaxis: enoxaparin (LOVENOX) injection 40 mg Start: 10/28/20 1000  Code status: Full  code Family Communication: Family being updated periodically Disposition: Waiting on SNF placement  Status is: Inpatient  Remains inpatient appropriate because:Inpatient level of care appropriate due to severity of illness   Dispo: The patient is from: Home              Anticipated d/c is to: SNF              Anticipated d/c date is: 1 day              Patient currently is medically stable to d/c.  Can go to SNF when bed is available     Consultants:   None  Procedures:   None  Antimicrobials:  Anti-infectives (From admission, onward)   Start     Dose/Rate Route Frequency Ordered Stop   10/28/20 1000  remdesivir 100 mg in sodium chloride 0.9 % 100 mL IVPB       "Followed by" Linked Group Details   100 mg 200 mL/hr over 30 Minutes Intravenous Daily 10/27/20 2244 10/31/20 1244   10/27/20 2245  remdesivir 200 mg in sodium chloride 0.9% 250 mL IVPB       "Followed by" Linked Group Details   200 mg 580 mL/hr over 30 Minutes Intravenous Once 10/27/20 2244 10/28/20 0107   10/27/20 2245  cefTRIAXone (ROCEPHIN) 2 g in sodium chloride 0.9 % 100 mL IVPB  Status:  Discontinued        2 g 200 mL/hr over 30 Minutes Intravenous Every 24 hours 10/27/20 2244 11/01/20 0931   10/27/20 2245  doxycycline (VIBRAMYCIN) 100 mg in sodium chloride 0.9 % 250 mL IVPB  Status:  Discontinued        100 mg 125 mL/hr over 120 Minutes Intravenous Every 12 hours 10/27/20 2244 11/01/20 0931  Objective: Vitals:   11/03/20 0005 11/03/20 0451 11/03/20 0755 11/03/20 1206  BP: 122/82 127/70 129/79 140/79  Pulse: 80 60 75 82  Resp: _0 Temp: 98 F (36.7 C) 98.3 F (36.8 C) 98.6 F (37 C) 99 F (37.2 C)  TempSrc: Axillary Axillary Oral Oral  SpO2: 100% 93% 92% 92%  Weight:      Height:        Intake/Output Summary (Last 24 hours) at 11/03/2020 1427 Last data filed at 11/03/2020 1335 Gross per 24 hour  Intake 460 ml  Output 1200 ml  Net -740 ml   Filed Weights   10/28/20  2122  Weight: 83.1 kg    Examination:  General appearance: Awake alert.  In no distress. Distracted Resp: Clear to auscultation bilaterally.  Normal effort Cardio: S1-S2 is normal regular.  No S3-S4.  No rubs murmurs or bruit GI: Abdomen is soft.  Nontender nondistended.  Bowel sounds are present normal.  No masses organomegaly Extremities: No edema. Moving all upper extremity Neurologic:  No focal neurological deficits.    Data Reviewed: I have personally reviewed following labs and imaging studies  CBC: Recent Labs  Lab 10/28/20 0500 10/28/20 0901 10/29/20 0354 10/30/20 0439 10/30/20 2205 10/31/20 0147 11/01/20 0332  WBC 4.9  --  6.6 6.1 8.7 7.3 6.8  NEUTROABS 4.1  --  5.7 5.2  --  6.2 5.8  HGB 15.3*   < > 14.1 13.2 13.4 13.2 13.4  HCT 48.9*   < > 43.7 40.9 41.1 41.7 39.3  MCV 92.1  --  88.6 88.5 88.6 90.3 86.8  PLT 138*  --  175 203 200 195 201   < > = values in this interval not displayed.   Basic Metabolic Panel: Recent Labs  Lab 10/29/20 0354 10/30/20 0439 10/30/20 2205 10/31/20 0147 11/01/20 0332 11/03/20 0455  NA 142 141 143 144 140  --   K 4.0 3.8 4.2 4.5 4.1  --   CL 108 110 112* 110 112*  --   CO2 20* _1 21*  --   GLUCOSE 144* 162* 129* 151* 178*  --   BUN 24* 27* 32* 31* 29*  --   CREATININE 1.26* 1.22* 1.09* 1.15* 1.01* 1.04*  CALCIUM 8.3* 8.1* 8.2* 8.3* 8.1*  --    GFR: Estimated Creatinine Clearance: 46.2 mL/min (A) (by C-G formula based on SCr of 1.04 mg/dL (H)). Liver Function Tests: Recent Labs  Lab 10/29/20 0354 10/30/20 0439 10/30/20 2205 10/31/20 0147 11/01/20 0332  AST 38 38 _2 ALT _3 ALKPHOS 62 50 52 50 53  BILITOT 0.5 0.7 0.2* 0.4 0.7  PROT 5.9* 5.3* 5.1* 5.3* 5.1*  ALBUMIN 3.1* 2.9* 2.8* 2.8* 2.6*   Coagulation Profile: Recent Labs  Lab 10/27/20 1505  INR 1.0   CBG: Recent Labs  Lab 11/02/20 1219 11/02/20 1724 11/02/20 2017 11/03/20 0755 11/03/20 1206  GLUCAP 156* 248* 215* 113* 149*    Sepsis Labs: Recent Labs  Lab 10/27/20 1605 10/27/20 1700 10/28/20 0101 10/28/20 0500 10/29/20 0354  PROCALCITON  --  0.54 0.75 0.65 0.48  LATICACIDVEN 1.5  --   --   --   --     Recent Results (from the past 240 hour(s))  Culture, blood (Routine x 2)     Status: None   Collection Time: 10/27/20  4:05 PM   Specimen: BLOOD  Result Value Ref Range Status   Specimen Description  BLOOD LEFT UPPER ARM  Final   Special Requests   Final    BOTTLES DRAWN AEROBIC AND ANAEROBIC Blood Culture adequate volume   Culture   Final    NO GROWTH 5 DAYS Performed at [facility], [address] [place], [place] [address]    Report Status 11/01/2020 FINAL  Final      Radiology Studies: No results found.  Scheduled Meds: . dexamethasone  6 mg Oral Daily  . enoxaparin (LOVENOX) injection  40 mg Subcutaneous Daily  . feeding supplement  237 mL Oral BID BM  . memantine  10 mg Oral BID  . mirtazapine  7.5 mg Oral QHS  . pantoprazole  40 mg Oral Daily  . pyridostigmine  60 mg Oral TID  . QUEtiapine  12.5 mg Oral QHS   Continuous Infusions:    LOS: 7 days   Bonnielee Haff, MD [facility]  11/03/2020, 2:27 PM   To contact the attending provider between 7A-7P or the covering provider during after hours 7P-7A, please log into the web site [email or web address].

## 2020-11-04 DIAGNOSIS — F0281 Dementia in other diseases classified elsewhere with behavioral disturbance: Secondary | ICD-10-CM | POA: Diagnosis not present

## 2020-11-04 DIAGNOSIS — G309 Alzheimer's disease, unspecified: Secondary | ICD-10-CM | POA: Diagnosis not present

## 2020-11-04 LAB — GLUCOSE, CAPILLARY
Glucose-Capillary: 143 mg/dL — ABNORMAL HIGH (ref 70–99)
Glucose-Capillary: 213 mg/dL — ABNORMAL HIGH (ref 70–99)
Glucose-Capillary: 176 mg/dL — ABNORMAL HIGH (ref 70–99)
Glucose-Capillary: 182 mg/dL — ABNORMAL HIGH (ref 70–99)

## 2020-11-04 NOTE — Progress Notes (Signed)
Dr. Rito Ehrlich aware that patient does not have IV access due to patient only waiting on SNF placement.

## 2020-11-04 NOTE — Progress Notes (Signed)
Physical Therapy Treatment Patient Details Name: [name] [name] MRN: [id number] DOB: May 15, 1936 Today's Date: 11/04/2020    History of Present Illness 85 y.o. female with known history of dementia and ocular myasthenia gravis was found to be increasingly confused by patient's daughter and was brought to the ER 12/29/21where she was found to be COVID positive, chest X-ray concerning for PNA. Admitted acute hypoxic respiratory failure with sepsis secondary to COVID PNA, and acute metabolic encephalopathy requiring sitter.    PT Comments    Pt up in recliner, with poor placement as pt had slid down onto the foot rest. Pt pleasantly unaware of poor positioning. Pt overall requires mod assist for bed mobility, repeated transfers, and short distance ambulation with use of RW. Per RN, family has expressed interest in having pt d/c home. If pt and family wish for pt to d/c home, pt will need 24/7 physical assist and HHPT. PT continuing to recommend SNF as safest d/c option.    Follow Up Recommendations  SNF     Equipment Recommendations  Wheelchair (measurements PT);Wheelchair cushion (measurements PT);Hospital bed    Recommendations for Other Services       Precautions / Restrictions Precautions Precautions: Fall Restrictions Weight Bearing Restrictions: No    Mobility  Bed Mobility Overal bed mobility: Needs Assistance Bed Mobility: Sit to Supine;Rolling Rolling: Min assist     Sit to supine: Mod assist;+2 for physical assistance   General bed mobility comments: Mod assist for return to supine for LE lifting into bed, trunk lowering, and boost up in bed.  Transfers Overall transfer level: Needs assistance Equipment used: Rolling walker (2 wheeled) Transfers: Sit to/from Stand Sit to Stand: Mod assist         General transfer comment: Mod assist for power up, rise, and steady. STS x3, from recliner x1 and from EOB x2 as intervention.  Ambulation/Gait Ambulation/Gait  assistance: Min assist Gait Distance (Feet): 10 Feet Assistive device: Rolling walker (2 wheeled) Gait Pattern/deviations: Step-through pattern;Trunk flexed;Decreased stride length Gait velocity: decr   General Gait Details: Min assist to steady, navigate pt and RW. Verbal cuing for room navigation, placement in RW .   Stairs             Wheelchair Mobility    Modified Rankin (Stroke Patients Only)       Balance Overall balance assessment: Needs assistance Sitting-balance support: Bilateral upper extremity supported;Feet supported Sitting balance-Leahy Scale: Fair     Standing balance support: Bilateral upper extremity supported;During functional activity Standing balance-Leahy Scale: Poor Standing balance comment: reliant on external assist                            Cognition Arousal/Alertness: Awake/alert Behavior During Therapy: WFL for tasks assessed/performed Overall Cognitive Status: History of cognitive impairments - at baseline                                 General Comments: Pt with history of dementia, periods of nonsensical speech but mostly understandable this session. Pt follows mobility commands when given very short, direct commands. A&O x1, unable to state where she is and asks "what is covid?" when PT orients pt to situation.      Exercises      General Comments General comments (skin integrity, edema, etc.): HRmax 140 bpm during mobility, RN aware      Pertinent Vitals/Pain Pain  Assessment: Faces Faces Pain Scale: No hurt Pain Intervention(s): Monitored during session    Home Living                      Prior Function            PT Goals (current goals can now be found in the care plan section) Acute Rehab PT Goals PT Goal Formulation: With patient Time For Goal Achievement: 11/13/20 Potential to Achieve Goals: Fair Progress towards PT goals: Progressing toward goals    Frequency    Min  2X/week      PT Plan Current plan remains appropriate    Co-evaluation              AM-PAC PT "6 Clicks" Mobility   Outcome Measure  Help needed turning from your back to your side while in a flat bed without using bedrails?: A Lot Help needed moving from lying on your back to sitting on the side of a flat bed without using bedrails?: A Lot Help needed moving to and from a bed to a chair (including a wheelchair)?: A Lot Help needed standing up from a chair using your arms (e.g., wheelchair or bedside chair)?: A Lot Help needed to walk in hospital room?: A Little Help needed climbing 3-5 steps with a railing? : Total 6 Click Score: 12    End of Session   Activity Tolerance: Patient tolerated treatment well Patient left: with call bell/phone within reach;in bed;with bed alarm set;with nursing/sitter in room Nurse Communication: Mobility status PT Visit Diagnosis: Unsteadiness on feet (R26.81);Other abnormalities of gait and mobility (R26.89);Muscle weakness (generalized) (M62.81);Difficulty in walking, not elsewhere classified (R26.2)     Time: 1140-1157 PT Time Calculation (min) (ACUTE ONLY): 17 min  Charges:  $Therapeutic Activity: 8-22 mins                    Marye Round, PT Acute Rehabilitation Services Pager [phone]  Office [phone]    Tyrone Apple E Christain [place] 11/04/2020, 12:19 PM

## 2020-11-04 NOTE — Progress Notes (Signed)
Spoke with daughter Synetta Fail to update her on her mom.

## 2020-11-04 NOTE — Progress Notes (Signed)
PROGRESS NOTE    [name] [name]  UMP:[id number] DOB: 05-13-1936 DOA: 10/27/2020 PCP: Tonia Ghent, MD   Brief Narrative:   [name] [name] is a 85 y.o. female with known history of dementia and ocular myasthenia gravis was found to be increasingly confused by patient's daughter and was brought to the ER.  Per the report patient usually is ambulatory and able to dress herself.  Patient is lethargic, and confused on presentation, work-up for encephalopathy is negative, her work-up was significant for Covid pneumonia, and metabolic encephalopathy in the setting of Covid severe hospital delirium with underlining advanced dementia.  Subjective:  Patient remains pleasantly confused.  No complaints offered.  No overnight issues noted  Assessment & Plan:  Pneumonia due to COVID-19/acute respiratory failure with hypoxia  Patient is unvaccinated against COVID-19. Patient met sepsis criteria based on the source of infection/pneumonia with tachycardia and tachypnea.  She however had minimal oxygen requirements. Patient was treated with ceftriaxone and doxycycline for presumed bacterial pneumonia. Also treated with Remdesivir and steroids. Did not require Actemra or baricitinib.  We will stop dexamethasone after 2 more days. CT angiogram ruled out pulmonary embolism. Respiratory status is stable.   Acute Metabolic  encephalopathy:  -Patient with underlying Alzheimer's dementia -Patient noted to have significant delirium during this hospital stay. Appears to have improved.  - MRI, CT angiogram of head and neck all are negative for any acute pathology.  Case was discussed with neurology by admitting hospitalist.  History of myasthenia gravis:  Stable. Pyridostigmine was resumed.  History of Alzheimer's dementia:  Continue home medication   DVT prophylaxis: enoxaparin (LOVENOX) injection 40 mg Start: 10/28/20 1000  Code status: Full code Family Communication: Family being updated  periodically Disposition: Waiting on SNF placement  Status is: Inpatient  Remains inpatient appropriate because:Inpatient level of care appropriate due to severity of illness   Dispo: The patient is from: Home              Anticipated d/c is to: SNF              Anticipated d/c date is: 1 day              Patient currently is medically stable to d/c.  Can go to SNF when bed is available     Consultants:   None  Procedures:   None  Antimicrobials:  Anti-infectives (From admission, onward)   Start     Dose/Rate Route Frequency Ordered Stop   10/28/20 1000  remdesivir 100 mg in sodium chloride 0.9 % 100 mL IVPB       "Followed by" Linked Group Details   100 mg 200 mL/hr over 30 Minutes Intravenous Daily 10/27/20 2244 10/31/20 1244   10/27/20 2245  remdesivir 200 mg in sodium chloride 0.9% 250 mL IVPB       "Followed by" Linked Group Details   200 mg 580 mL/hr over 30 Minutes Intravenous Once 10/27/20 2244 10/28/20 0107   10/27/20 2245  cefTRIAXone (ROCEPHIN) 2 g in sodium chloride 0.9 % 100 mL IVPB  Status:  Discontinued        2 g 200 mL/hr over 30 Minutes Intravenous Every 24 hours 10/27/20 2244 11/01/20 0931   10/27/20 2245  doxycycline (VIBRAMYCIN) 100 mg in sodium chloride 0.9 % 250 mL IVPB  Status:  Discontinued        100 mg 125 mL/hr over 120 Minutes Intravenous Every 12 hours 10/27/20 2244 11/01/20 0931  Objective: Vitals:   11/03/20 2003 11/04/20 0439 11/04/20 0823 11/04/20 1201  BP: (!) 142/78 102/62 108/67 116/90  Pulse: 71 60 66 (!) 102  Resp: _0 Temp: 98.7 F (37.1 C) 98.6 F (37 C) 98.5 F (36.9 C) (!) 97.3 F (36.3 C)  TempSrc: Axillary Axillary Axillary Oral  SpO2: 90% 94% 94% 90%  Weight:      Height:        Intake/Output Summary (Last 24 hours) at 11/04/2020 1220 Last data filed at 11/04/2020 1010 Gross per 24 hour  Intake 770 ml  Output 300 ml  Net 470 ml   Filed Weights   10/28/20 2122  Weight: 83.1 kg     Examination:  General appearance: Awake alert.  In no distress.  Pleasantly confused Resp: Clear to auscultation bilaterally.  Normal effort Cardio: S1-S2 is normal regular.  No S3-S4.  No rubs murmurs or bruit GI: Abdomen is soft.  Nontender nondistended.  Bowel sounds are present normal.  No masses organomegaly Extremities: No edema.  Neurologic:  No focal neurological deficits.      Data Reviewed: I have personally reviewed following labs and imaging studies  CBC: Recent Labs  Lab 10/29/20 0354 10/30/20 0439 10/30/20 2205 10/31/20 0147 11/01/20 0332  WBC 6.6 6.1 8.7 7.3 6.8  NEUTROABS 5.7 5.2  --  6.2 5.8  HGB 14.1 13.2 13.4 13.2 13.4  HCT 43.7 40.9 41.1 41.7 39.3  MCV 88.6 88.5 88.6 90.3 86.8  PLT 175 203 200 195 616   Basic Metabolic Panel: Recent Labs  Lab 10/29/20 0354 10/30/20 0439 10/30/20 2205 10/31/20 0147 11/01/20 0332 11/03/20 0455  NA 142 141 143 144 140  --   K 4.0 3.8 4.2 4.5 4.1  --   CL 108 110 112* 110 112*  --   CO2 20* _1 21*  --   GLUCOSE 144* 162* 129* 151* 178*  --   BUN 24* 27* 32* 31* 29*  --   CREATININE 1.26* 1.22* 1.09* 1.15* 1.01* 1.04*  CALCIUM 8.3* 8.1* 8.2* 8.3* 8.1*  --    GFR: Estimated Creatinine Clearance: 46.2 mL/min (A) (by C-G formula based on SCr of 1.04 mg/dL (H)). Liver Function Tests: Recent Labs  Lab 10/29/20 0354 10/30/20 0439 10/30/20 2205 10/31/20 0147 11/01/20 0332  AST 38 38 _2 ALT _3 ALKPHOS 62 50 52 50 53  BILITOT 0.5 0.7 0.2* 0.4 0.7  PROT 5.9* 5.3* 5.1* 5.3* 5.1*  ALBUMIN 3.1* 2.9* 2.8* 2.8* 2.6*   CBG: Recent Labs  Lab 11/03/20 0755 11/03/20 1206 11/03/20 1717 11/03/20 2003 11/04/20 0819  GLUCAP 113* 149* 250* 194* 143*   Sepsis Labs: Recent Labs  Lab 10/29/20 0354  PROCALCITON 0.48    Recent Results (from the past 240 hour(s))  Culture, blood (Routine x 2)     Status: None   Collection Time: 10/27/20  4:05 PM   Specimen: BLOOD  Result Value Ref  Range Status   Specimen Description BLOOD LEFT UPPER ARM  Final   Special Requests   Final    BOTTLES DRAWN AEROBIC AND ANAEROBIC Blood Culture adequate volume   Culture   Final    NO GROWTH 5 DAYS Performed at [facility], [place] [address] [place], [place] [address]    Report Status 11/01/2020 FINAL  Final      Radiology Studies: No results found.  Scheduled Meds: . dexamethasone  6 mg Oral  Daily  . enoxaparin (LOVENOX) injection  40 mg Subcutaneous Daily  . feeding supplement  237 mL Oral BID BM  . memantine  10 mg Oral BID  . mirtazapine  7.5 mg Oral QHS  . pantoprazole  40 mg Oral Daily  . pyridostigmine  60 mg Oral TID  . QUEtiapine  12.5 mg Oral QHS   Continuous Infusions:    LOS: 8 days   Bonnielee Haff, MD [facility]  11/04/2020, 12:20 PM   To contact the attending provider between 7A-7P or the covering provider during after hours 7P-7A, please log into the web site [email or web address].

## 2020-11-05 DIAGNOSIS — G309 Alzheimer's disease, unspecified: Secondary | ICD-10-CM | POA: Diagnosis not present

## 2020-11-05 DIAGNOSIS — F0281 Dementia in other diseases classified elsewhere with behavioral disturbance: Secondary | ICD-10-CM | POA: Diagnosis not present

## 2020-11-05 LAB — CBC
HCT: 45 % (ref 36.0–46.0)
Hemoglobin: 14.6 g/dL (ref 12.0–15.0)
MCH: 28.8 pg (ref 26.0–34.0)
MCHC: 32.4 g/dL (ref 30.0–36.0)
MCV: 88.8 fL (ref 80.0–100.0)
Platelets: 243 10*3/uL (ref 150–400)
RBC: 5.07 MIL/uL (ref 3.87–5.11)
RDW: 13.2 % (ref 11.5–15.5)
WBC: 9 10*3/uL (ref 4.0–10.5)
nRBC: 0 % (ref 0.0–0.2)

## 2020-11-05 LAB — BASIC METABOLIC PANEL
Anion gap: 10 (ref 5–15)
BUN: 25 mg/dL — ABNORMAL HIGH (ref 8–23)
CO2: 27 mmol/L (ref 22–32)
Calcium: 8.5 mg/dL — ABNORMAL LOW (ref 8.9–10.3)
Chloride: 104 mmol/L (ref 98–111)
Creatinine, Ser: 1.02 mg/dL — ABNORMAL HIGH (ref 0.44–1.00)
GFR, Estimated: 55 mL/min — ABNORMAL LOW (ref >60)
Glucose, Bld: 170 mg/dL — ABNORMAL HIGH (ref 70–99)
Potassium: 4.2 mmol/L (ref 3.5–5.1)
Sodium: 141 mmol/L (ref 135–145)

## 2020-11-05 LAB — GLUCOSE, CAPILLARY
Glucose-Capillary: 110 mg/dL — ABNORMAL HIGH (ref 70–99)
Glucose-Capillary: 134 mg/dL — ABNORMAL HIGH (ref 70–99)
Glucose-Capillary: 170 mg/dL — ABNORMAL HIGH (ref 70–99)
Glucose-Capillary: 151 mg/dL — ABNORMAL HIGH (ref 70–99)

## 2020-11-05 NOTE — Plan of Care (Signed)
  Problem: Education: Goal: Knowledge of General Education information will improve Description: Including pain rating scale, medication(s)/side effects and non-pharmacologic comfort measures 11/05/2020 1439 by Augusto Garbe, RN Outcome: Not Met (add Reason) 11/05/2020 1439 by Augusto Garbe, RN Outcome: Not Progressing   Problem: Health Behavior/Discharge Planning: Goal: Ability to manage health-related needs will improve 11/05/2020 1439 by Augusto Garbe, RN Outcome: Not Met (add Reason) 11/05/2020 1439 by Augusto Garbe, RN Outcome: Not Progressing   Problem: Clinical Measurements: Goal: Ability to maintain clinical measurements within normal limits will improve 11/05/2020 1439 by Augusto Garbe, RN Outcome: Not Met (add Reason) 11/05/2020 1439 by Augusto Garbe, RN Outcome: Not Progressing Goal: Will remain free from infection 11/05/2020 1439 by Augusto Garbe, RN Outcome: Not Met (add Reason) 11/05/2020 1439 by Augusto Garbe, RN Outcome: Not Progressing Goal: Diagnostic test results will improve 11/05/2020 1439 by Augusto Garbe, RN Outcome: Not Met (add Reason) 11/05/2020 1439 by Augusto Garbe, RN Outcome: Not Progressing   Problem: Activity: Goal: Risk for activity intolerance will decrease 11/05/2020 1439 by Augusto Garbe, RN Outcome: Not Met (add Reason) 11/05/2020 1439 by Augusto Garbe, RN Outcome: Not Progressing   Problem: Nutrition: Goal: Adequate nutrition will be maintained 11/05/2020 1439 by Augusto Garbe, RN Outcome: Not Met (add Reason) 11/05/2020 1439 by Augusto Garbe, RN Outcome: Not Progressing   Problem: Coping: Goal: Level of anxiety will decrease 11/05/2020 1439 by Augusto Garbe, RN Outcome: Not Met (add Reason) 11/05/2020 1439 by Augusto Garbe, RN Outcome: Not Progressing   Problem: Elimination: Goal: Will not experience complications related to bowel motility 11/05/2020 1439 by Augusto Garbe, RN Outcome: Not Met (add Reason) 11/05/2020 1439 by Augusto Garbe, RN Outcome: Not Progressing   Problem: Education: Goal: Knowledge of risk factors and  measures for prevention of condition will improve 11/05/2020 1439 by Augusto Garbe, RN Outcome: Not Met (add Reason) 11/05/2020 1439 by Augusto Garbe, RN Outcome: Not Progressing

## 2020-11-05 NOTE — Progress Notes (Signed)
PROGRESS NOTE    [name] [name]  ZOX:[id number] DOB: [birth year] DOA: 10/27/2020 PCP: Tonia Ghent, MD   Brief Narrative:   [name] [name] is a 85 y.o. female with known history of dementia and ocular myasthenia gravis was found to be increasingly confused by patient's daughter and was brought to the ER.  Per the report patient usually is ambulatory and able to dress herself.  Patient is lethargic, and confused on presentation, work-up for encephalopathy is negative, her work-up was significant for Covid pneumonia, and metabolic encephalopathy in the setting of Covid severe hospital delirium with underlining advanced dementia.   Subjective:  Patient noted to be sitting up in the chair this morning.  Does not appear to be in any discomfort.  States that she is feeling well.  Assessment & Plan:  Pneumonia due to COVID-19/acute respiratory failure with hypoxia  Patient is unvaccinated against COVID-19. Patient met sepsis criteria based on the source of infection/pneumonia with tachycardia and tachypnea.  She however had minimal oxygen requirements. Patient was treated with ceftriaxone and doxycycline for presumed bacterial pneumonia. Also treated with Remdesivir and steroids. Did not require Actemra or baricitinib.  Dexamethasone to be discontinued after 2 more days. CT angiogram ruled out pulmonary embolism. Respiratory status remains stable.  Acute Metabolic  encephalopathy:  Patient with underlying Alzheimer's dementia Patient noted to have significant delirium during this hospital stay. Appears to have improved.   MRI, CT angiogram of head and neck all are negative for any acute pathology.  Case was discussed with neurology by admitting hospitalist.  History of myasthenia gravis:  Stable. Pyridostigmine was resumed.  History of Alzheimer's dementia:  Continue home medication   DVT prophylaxis: enoxaparin (LOVENOX) injection 40 mg Start: 10/28/20 1000  Code status: Full  code Family Communication: Family being updated periodically Disposition: Waiting on SNF placement  Status is: Inpatient  Remains inpatient appropriate because:Inpatient level of care appropriate due to severity of illness   Dispo: The patient is from: Home              Anticipated d/c is to: SNF              Anticipated d/c date is: 1 day              Patient currently is medically stable to d/c.  Can go to SNF when bed is available     Consultants:   None  Procedures:   None  Antimicrobials:  Anti-infectives (From admission, onward)   Start     Dose/Rate Route Frequency Ordered Stop   10/28/20 1000  remdesivir 100 mg in sodium chloride 0.9 % 100 mL IVPB       "Followed by" Linked Group Details   100 mg 200 mL/hr over 30 Minutes Intravenous Daily 10/27/20 2244 10/31/20 1244   10/27/20 2245  remdesivir 200 mg in sodium chloride 0.9% 250 mL IVPB       "Followed by" Linked Group Details   200 mg 580 mL/hr over 30 Minutes Intravenous Once 10/27/20 2244 10/28/20 0107   10/27/20 2245  cefTRIAXone (ROCEPHIN) 2 g in sodium chloride 0.9 % 100 mL IVPB  Status:  Discontinued        2 g 200 mL/hr over 30 Minutes Intravenous Every 24 hours 10/27/20 2244 11/01/20 0931   10/27/20 2245  doxycycline (VIBRAMYCIN) 100 mg in sodium chloride 0.9 % 250 mL IVPB  Status:  Discontinued        100 mg 125 mL/hr over 120  Minutes Intravenous Every 12 hours 10/27/20 2244 11/01/20 0931          Objective: Vitals:   11/04/20 1201 11/04/20 2046 11/05/20 0455 11/05/20 0817  BP: 116/90 127/73 125/70 132/85  Pulse: (!) 102 75 70 65  Resp:  '18 18 18  ' Temp: (!) 97.3 F (36.3 C) 98.5 F (36.9 C) 98.2 F (36.8 C) 97.9 F (36.6 C)  TempSrc: Oral Oral Oral Axillary  SpO2: 90% 94% 94% 91%  Weight:      Height:        Intake/Output Summary (Last 24 hours) at 11/05/2020 1058 Last data filed at 11/05/2020 0455 Gross per 24 hour  Intake 960 ml  Output 1300 ml  Net -340 ml   Filed Weights    10/28/20 2122  Weight: 83.1 kg    Examination:  General appearance: Awake alert.  In no distress.  Pleasantly confused Resp: Clear to auscultation bilaterally.  Normal effort Cardio: S1-S2 is normal regular.  No S3-S4.  No rubs murmurs or bruit GI: Abdomen is soft.  Nontender nondistended.  Bowel sounds are present normal.  No masses organomegaly Extremities: No edema.   .      Data Reviewed: I have personally reviewed following labs and imaging studies  CBC: Recent Labs  Lab 10/30/20 0439 10/30/20 2205 10/31/20 0147 11/01/20 0332 11/05/20 0245  WBC 6.1 8.7 7.3 6.8 9.0  NEUTROABS 5.2  --  6.2 5.8  --   HGB 13.2 13.4 13.2 13.4 14.6  HCT 40.9 41.1 41.7 39.3 45.0  MCV 88.5 88.6 90.3 86.8 88.8  PLT 203 200 195 201 546   Basic Metabolic Panel: Recent Labs  Lab 10/30/20 0439 10/30/20 2205 10/31/20 0147 11/01/20 0332 11/03/20 0455 11/05/20 0245  NA 141 143 144 140  --  141  K 3.8 4.2 4.5 4.1  --  4.2  CL 110 112* 110 112*  --  104  CO2 '22 22 25 ' 21*  --  27  GLUCOSE 162* 129* 151* 178*  --  170*  BUN 27* 32* 31* 29*  --  25*  CREATININE 1.22* 1.09* 1.15* 1.01* 1.04* 1.02*  CALCIUM 8.1* 8.2* 8.3* 8.1*  --  8.5*   GFR: Estimated Creatinine Clearance: 47.1 mL/min (A) (by C-G formula based on SCr of 1.02 mg/dL (H)). Liver Function Tests: Recent Labs  Lab 10/30/20 0439 10/30/20 2205 10/31/20 0147 11/01/20 0332  AST 38 '27 25 21  ' ALT '23 22 21 21  ' ALKPHOS 50 52 50 53  BILITOT 0.7 0.2* 0.4 0.7  PROT 5.3* 5.1* 5.3* 5.1*  ALBUMIN 2.9* 2.8* 2.8* 2.6*   CBG: Recent Labs  Lab 11/04/20 0819 11/04/20 1252 11/04/20 1719 11/04/20 2057 11/05/20 0826  GLUCAP 143* 213* 176* 182* 110*   Sepsis Labs: No results for input(s): PROCALCITON, LATICACIDVEN in the last 168 hours.  Recent Results (from the past 240 hour(s))  Culture, blood (Routine x 2)     Status: None   Collection Time: 10/27/20  4:05 PM   Specimen: BLOOD  Result Value Ref Range Status   Specimen  Description BLOOD LEFT UPPER ARM  Final   Special Requests   Final    BOTTLES DRAWN AEROBIC AND ANAEROBIC Blood Culture adequate volume   Culture   Final    NO GROWTH 5 DAYS Performed at [facility], [address] [place], [place] [address]    Report Status 11/01/2020 FINAL  Final      Radiology Studies: No results found.  Scheduled  Meds: . dexamethasone  6 mg Oral Daily  . enoxaparin (LOVENOX) injection  40 mg Subcutaneous Daily  . feeding supplement  237 mL Oral BID BM  . memantine  10 mg Oral BID  . mirtazapine  7.5 mg Oral QHS  . pantoprazole  40 mg Oral Daily  . pyridostigmine  60 mg Oral TID  . QUEtiapine  12.5 mg Oral QHS   Continuous Infusions:    LOS: 9 days   Bonnielee Haff, MD [facility]  11/05/2020, 10:58 AM   To contact the attending provider between 7A-7P or the covering provider during after hours 7P-7A, please log into the web site [email or web address].

## 2020-11-05 NOTE — Care Management Important Message (Signed)
Important Message  Patient Details  Name: [name] [name] MRN: [id number] Date of Birth: October 12, 1936   Medicare Important Message Given:  Yes - Important Message mailed due to current National Emergency  Verbal consent obtained due to current National Emergency  Relationship to patient: Spouse/Significant Other Contact Name: Ellesse Antenucci Call Date: 11/05/20  Time: 1444 Phone: [phone] Outcome: No Answer/Busy Important Message mailed to: Patient address on file    Orson Aloe 11/05/2020, 2:45 PM

## 2020-11-05 NOTE — TOC Progression Note (Signed)
Transition of Care [facility]) - Progression Note    Patient Details  Name: [name] [name] MRN: [id number] Date of Birth: 1936/06/10  Transition of Care [facility]) CM/SW Contact  Mearl Latin, LCSW Phone Number: 11/05/2020, 11:36 AM  Clinical Narrative:    CSW still awaiting a COVID SNF bed for patient.    Expected Discharge Plan: Skilled Nursing Facility Barriers to Discharge: Insurance Authorization,SNF Pending bed offer  Expected Discharge Plan and Services Expected Discharge Plan: Skilled Nursing Facility In-house Referral: Clinical Social Work Discharge Planning Services: CM Consult Post Acute Care Choice: Skilled Nursing Facility Living arrangements for the past 2 months: Single Family Home                                       Social Determinants of Health (SDOH) Interventions    Readmission Risk Interventions No flowsheet data found.

## 2020-11-06 ENCOUNTER — Other Ambulatory Visit: Payer: Self-pay

## 2020-11-06 DIAGNOSIS — G309 Alzheimer's disease, unspecified: Secondary | ICD-10-CM | POA: Diagnosis not present

## 2020-11-06 DIAGNOSIS — E162 Hypoglycemia, unspecified: Secondary | ICD-10-CM | POA: Diagnosis not present

## 2020-11-06 DIAGNOSIS — F0281 Dementia in other diseases classified elsewhere with behavioral disturbance: Secondary | ICD-10-CM | POA: Diagnosis not present

## 2020-11-06 LAB — GLUCOSE, CAPILLARY
Glucose-Capillary: 63 mg/dL — ABNORMAL LOW (ref 70–99)
Glucose-Capillary: 65 mg/dL — ABNORMAL LOW (ref 70–99)
Glucose-Capillary: 98 mg/dL (ref 70–99)
Glucose-Capillary: 113 mg/dL — ABNORMAL HIGH (ref 70–99)
Glucose-Capillary: 248 mg/dL — ABNORMAL HIGH (ref 70–99)

## 2020-11-06 MED ORDER — ENSURE ENLIVE PO LIQD
237.0000 mL | Freq: Three times a day (TID) | ORAL | Status: DC
  Administered 2020-11-06 – 2020-11-12 (×8): 237 mL via ORAL

## 2020-11-06 NOTE — Progress Notes (Addendum)
PROGRESS NOTE    [name] [name]  KGU:[id number] DOB: [birth year] DOA: 10/27/2020 PCP: Tonia Ghent, MD   Brief Narrative:   [name] [name] is a 85 y.o. female with known history of dementia and ocular myasthenia gravis was found to be increasingly confused by patient's daughter and was brought to the ER.  Per the report patient usually is ambulatory and able to dress herself.  Patient is lethargic, and confused on presentation, work-up for encephalopathy is negative, her work-up was significant for Covid pneumonia, and metabolic encephalopathy in the setting of Covid severe hospital delirium with underlining advanced dementia.   Subjective:  Patient pleasantly confused. Denies any complaints. Easily arousable. Noted to have low glucose levels this morning.   Assessment & Plan:  Pneumonia due to COVID-19/acute respiratory failure with hypoxia  Patient is unvaccinated against COVID-19. Patient met sepsis criteria based on the source of infection/pneumonia with tachycardia and tachypnea.  She however had minimal oxygen requirements. Patient was treated with ceftriaxone and doxycycline for presumed bacterial pneumonia. Also treated with Remdesivir and steroids. Did not require Actemra or baricitinib. Dexamethasone being tapered off. CT angiogram ruled out pulmonary embolism. Respiratory status remains stable. Can come off of isolation for COVID-19 on November 16, 2020.  Acute Metabolic  encephalopathy:  Patient with underlying Alzheimer's dementia Patient noted to have significant delirium during this hospital stay. Appears to have improved.   MRI, CT angiogram of head and neck all are negative for any acute pathology.  Case was discussed with neurology by admitting hospitalist.  Hypoglycemia Noted to have low glucose levels this morning. Not on any glucose lowering agents. Improved after she was given snack. Monitor 3 times a day for now. Encourage oral intake. Ensure.  History of  myasthenia gravis:  Stable. Pyridostigmine was resumed.  History of Alzheimer's dementia:  Continue home medication   DVT prophylaxis: enoxaparin (LOVENOX) injection 40 mg Start: 10/28/20 1000  Code status: Full code Family Communication: Family being updated periodically Disposition: Waiting on SNF placement  Status is: Inpatient  Remains inpatient appropriate because:Inpatient level of care appropriate due to severity of illness   Dispo: The patient is from: Home              Anticipated d/c is to: SNF              Anticipated d/c date is: 1 day              Patient currently is medically stable to d/c.  Can go to SNF when bed is available     Consultants:   None  Procedures:   None  Antimicrobials:  Anti-infectives (From admission, onward)   Start     Dose/Rate Route Frequency Ordered Stop   10/28/20 1000  remdesivir 100 mg in sodium chloride 0.9 % 100 mL IVPB       "Followed by" Linked Group Details   100 mg 200 mL/hr over 30 Minutes Intravenous Daily 10/27/20 2244 10/31/20 1244   10/27/20 2245  remdesivir 200 mg in sodium chloride 0.9% 250 mL IVPB       "Followed by" Linked Group Details   200 mg 580 mL/hr over 30 Minutes Intravenous Once 10/27/20 2244 10/28/20 0107   10/27/20 2245  cefTRIAXone (ROCEPHIN) 2 g in sodium chloride 0.9 % 100 mL IVPB  Status:  Discontinued        2 g 200 mL/hr over 30 Minutes Intravenous Every 24 hours 10/27/20 2244 11/01/20 0931   10/27/20 2245  doxycycline (VIBRAMYCIN) 100 mg in sodium chloride 0.9 % 250 mL IVPB  Status:  Discontinued        100 mg 125 mL/hr over 120 Minutes Intravenous Every 12 hours 10/27/20 2244 11/01/20 0931          Objective: Vitals:   11/05/20 1320 11/05/20 2021 11/06/20 0437 11/06/20 1046  BP: 130/78 135/88 136/71   Pulse: 99 88 74   Resp: '18 18 18   ' Temp: 98.7 F (37.1 C) 98 F (36.7 C) 98 F (36.7 C)   TempSrc: Axillary     SpO2: 94% 97% 97% 94%  Weight:      Height:         Intake/Output Summary (Last 24 hours) at 11/06/2020 1142 Last data filed at 11/06/2020 0437 Gross per 24 hour  Intake --  Output 500 ml  Net -500 ml   Filed Weights   10/28/20 2122  Weight: 83.1 kg    Examination:  General appearance: Awake alert.  In no distress. Pleasantly confused Resp: Clear to auscultation bilaterally.  Normal effort Cardio: S1-S2 is normal regular.  No S3-S4.  No rubs murmurs or bruit GI: Abdomen is soft.  Nontender nondistended.  Bowel sounds are present normal.  No masses organomegaly .      Data Reviewed: I have personally reviewed following labs and imaging studies  CBC: Recent Labs  Lab 10/30/20 2205 10/31/20 0147 11/01/20 0332 11/05/20 0245  WBC 8.7 7.3 6.8 9.0  NEUTROABS  --  6.2 5.8  --   HGB 13.4 13.2 13.4 14.6  HCT 41.1 41.7 39.3 45.0  MCV 88.6 90.3 86.8 88.8  PLT 200 195 201 903   Basic Metabolic Panel: Recent Labs  Lab 10/30/20 2205 10/31/20 0147 11/01/20 0332 11/03/20 0455 11/05/20 0245  NA 143 144 140  --  141  K 4.2 4.5 4.1  --  4.2  CL 112* 110 112*  --  104  CO2 22 25 21*  --  27  GLUCOSE 129* 151* 178*  --  170*  BUN 32* 31* 29*  --  25*  CREATININE 1.09* 1.15* 1.01* 1.04* 1.02*  CALCIUM 8.2* 8.3* 8.1*  --  8.5*   GFR: Estimated Creatinine Clearance: 47.1 mL/min (A) (by C-G formula based on SCr of 1.02 mg/dL (H)). Liver Function Tests: Recent Labs  Lab 10/30/20 2205 10/31/20 0147 11/01/20 0332  AST '27 25 21  ' ALT '22 21 21  ' ALKPHOS 52 50 53  BILITOT 0.2* 0.4 0.7  PROT 5.1* 5.3* 5.1*  ALBUMIN 2.8* 2.8* 2.6*   CBG: Recent Labs  Lab 11/05/20 1631 11/05/20 2131 11/06/20 0758 11/06/20 0807 11/06/20 0942  GLUCAP 170* 151* 63* 65* 98     Recent Results (from the past 240 hour(s))  Culture, blood (Routine x 2)     Status: None   Collection Time: 10/27/20  4:05 PM   Specimen: BLOOD  Result Value Ref Range Status   Specimen Description BLOOD LEFT UPPER ARM  Final   Special Requests   Final     BOTTLES DRAWN AEROBIC AND ANAEROBIC Blood Culture adequate volume   Culture   Final    NO GROWTH 5 DAYS Performed at [facility], [address] [place], [place] [address]    Report Status 11/01/2020 FINAL  Final      Radiology Studies: No results found.  Scheduled Meds: . enoxaparin (LOVENOX) injection  40 mg Subcutaneous Daily  . feeding supplement  237 mL Oral BID BM  .  memantine  10 mg Oral BID  . mirtazapine  7.5 mg Oral QHS  . pantoprazole  40 mg Oral Daily  . pyridostigmine  60 mg Oral TID  . QUEtiapine  12.5 mg Oral QHS   Continuous Infusions:    LOS: 10 days   Bonnielee Haff, MD [facility]  11/06/2020, 11:42 AM   To contact the attending provider between 7A-7P or the covering provider during after hours 7P-7A, please log into the web site [email or web address].

## 2020-11-06 NOTE — Progress Notes (Signed)
I tried to reach spouse listed to see if I could ask questions to complete patient admission, there was no answer, unable to complete at this time.

## 2020-11-07 DIAGNOSIS — F0281 Dementia in other diseases classified elsewhere with behavioral disturbance: Secondary | ICD-10-CM | POA: Diagnosis not present

## 2020-11-07 DIAGNOSIS — G309 Alzheimer's disease, unspecified: Secondary | ICD-10-CM | POA: Diagnosis not present

## 2020-11-07 DIAGNOSIS — E162 Hypoglycemia, unspecified: Secondary | ICD-10-CM | POA: Diagnosis not present

## 2020-11-07 LAB — GLUCOSE, CAPILLARY
Glucose-Capillary: 128 mg/dL — ABNORMAL HIGH (ref 70–99)
Glucose-Capillary: 149 mg/dL — ABNORMAL HIGH (ref 70–99)
Glucose-Capillary: 132 mg/dL — ABNORMAL HIGH (ref 70–99)
Glucose-Capillary: 155 mg/dL — ABNORMAL HIGH (ref 70–99)

## 2020-11-07 NOTE — TOC Progression Note (Signed)
Transition of Care [facility]) - Progression Note    Patient Details  Name: [name] [name] MRN: [id number] Date of Birth: [birth year]  Transition of Care [facility]) CM/SW Contact  Terrial Rhodes, LCSWA Phone Number: 11/07/2020, 10:26 AM  Clinical Narrative:      CSW spoke with [place] with [facility]. They have no current Covid SNF beds at the moment. Everardo Pacific told CSW to follow up with [place] with [facility] tomorrow 1/10 to check status on SNF bed availability for patient.  CSW will continue to follow.   Expected Discharge Plan: Skilled Nursing Facility Barriers to Discharge: Insurance Authorization,SNF Pending bed offer  Expected Discharge Plan and Services Expected Discharge Plan: Skilled Nursing Facility In-house Referral: Clinical Social Work Discharge Planning Services: CM Consult Post Acute Care Choice: Skilled Nursing Facility Living arrangements for the past 2 months: Single Family Home                                       Social Determinants of Health (SDOH) Interventions    Readmission Risk Interventions No flowsheet data found.

## 2020-11-07 NOTE — Progress Notes (Signed)
PROGRESS NOTE    [name] [name]  PTW:[id number] DOB: [birth year] DOA: 10/27/2020 PCP: Tonia Ghent, MD   Brief Narrative:   [name] [name] is a 85 y.o. female with known history of dementia and ocular myasthenia gravis was found to be increasingly confused by patient's daughter and was brought to the ER.  Per the report patient usually is ambulatory and able to dress herself.  Patient is lethargic, and confused on presentation, work-up for encephalopathy is negative, her work-up was significant for Covid pneumonia, and metabolic encephalopathy in the setting of Covid severe hospital delirium with underlining advanced dementia.   Subjective:  Patient remains pleasantly confused.  Noted to be somnolent this morning but easily arousable.  No new issues per nursing staff.  Assessment & Plan:  Pneumonia due to COVID-19/acute respiratory failure with hypoxia  Patient is unvaccinated against COVID-19. Patient met sepsis criteria based on the source of infection/pneumonia with tachycardia and tachypnea.  She however had minimal oxygen requirements. Patient was treated with ceftriaxone and doxycycline for presumed bacterial pneumonia. Also treated with Remdesivir and steroids. Did not require Actemra or baricitinib. Dexamethasone being tapered off. CT angiogram ruled out pulmonary embolism. Respiratory status remained stable.  She is saturating normal on room air. Can come off of isolation for COVID-19 on November 16, 2020.  Acute Metabolic  encephalopathy:  Patient with underlying Alzheimer's dementia Patient noted to have significant delirium during this hospital stay. Appears to have improved.  MRI, CT angiogram of head and neck all are negative for any acute pathology.  Hypoglycemia To have hypoglycemia yesterday.  Most likely due to poor oral intake.  Subsequently noted to be stable.  Will not treat hypoglycemia aggressively in this elderly patient.  Encourage oral intake.  Ensure.  History of myasthenia gravis:  Stable. Pyridostigmine was resumed.  History of Alzheimer's dementia:  Continue home medication   DVT prophylaxis: enoxaparin (LOVENOX) injection 40 mg Start: 10/28/20 1000  Code status: Full code Family Communication: Family being updated periodically Disposition: Waiting on SNF placement  Status is: Inpatient  Remains inpatient appropriate because:Inpatient level of care appropriate due to severity of illness   Dispo: The patient is from: Home              Anticipated d/c is to: SNF              Anticipated d/c date is: 1 day              Patient currently is medically stable to d/c.  Can go to SNF when bed is available     Consultants:   None  Procedures:   None  Antimicrobials:  Anti-infectives (From admission, onward)   Start     Dose/Rate Route Frequency Ordered Stop   10/28/20 1000  remdesivir 100 mg in sodium chloride 0.9 % 100 mL IVPB       "Followed by" Linked Group Details   100 mg 200 mL/hr over 30 Minutes Intravenous Daily 10/27/20 2244 10/31/20 1244   10/27/20 2245  remdesivir 200 mg in sodium chloride 0.9% 250 mL IVPB       "Followed by" Linked Group Details   200 mg 580 mL/hr over 30 Minutes Intravenous Once 10/27/20 2244 10/28/20 0107   10/27/20 2245  cefTRIAXone (ROCEPHIN) 2 g in sodium chloride 0.9 % 100 mL IVPB  Status:  Discontinued        2 g 200 mL/hr over 30 Minutes Intravenous Every 24 hours 10/27/20 2244 11/01/20 0931  10/27/20 2245  doxycycline (VIBRAMYCIN) 100 mg in sodium chloride 0.9 % 250 mL IVPB  Status:  Discontinued        100 mg 125 mL/hr over 120 Minutes Intravenous Every 12 hours 10/27/20 2244 11/01/20 0931          Objective: Vitals:   11/06/20 1930 11/07/20 0000 11/07/20 0400 11/07/20 0806  BP: 122/73 122/79 115/65   Pulse: 93 76 96   Resp: '19 18 20   ' Temp: 98.2 F (36.8 C) 98.4 F (36.9 C) (!) 97.5 F (36.4 C)   TempSrc: Axillary Axillary Axillary   SpO2: 95% 94% 95%  95%  Weight:      Height:        Intake/Output Summary (Last 24 hours) at 11/07/2020 1121 Last data filed at 11/07/2020 1012 Gross per 24 hour  Intake 720 ml  Output 1300 ml  Net -580 ml   Filed Weights   10/28/20 2122  Weight: 83.1 kg    Examination:  General appearance: Awake alert.  In no distress.  Remains pleasantly confused Resp: Clear to auscultation bilaterally.  Normal effort Cardio: S1-S2 is normal regular.  No S3-S4.  No rubs murmurs or bruit GI: Abdomen is soft.  Nontender nondistended.  Bowel sounds are present normal.  No masses organomegaly      Data Reviewed: I have personally reviewed following labs and imaging studies  CBC: Recent Labs  Lab 11/01/20 0332 11/05/20 0245  WBC 6.8 9.0  NEUTROABS 5.8  --   HGB 13.4 14.6  HCT 39.3 45.0  MCV 86.8 88.8  PLT 201 197   Basic Metabolic Panel: Recent Labs  Lab 11/01/20 0332 11/03/20 0455 11/05/20 0245  NA 140  --  141  K 4.1  --  4.2  CL 112*  --  104  CO2 21*  --  27  GLUCOSE 178*  --  170*  BUN 29*  --  25*  CREATININE 1.01* 1.04* 1.02*  CALCIUM 8.1*  --  8.5*   GFR: Estimated Creatinine Clearance: 47.1 mL/min (A) (by C-G formula based on SCr of 1.02 mg/dL (H)). Liver Function Tests: Recent Labs  Lab 11/01/20 0332  AST 21  ALT 21  ALKPHOS 53  BILITOT 0.7  PROT 5.1*  ALBUMIN 2.6*   CBG: Recent Labs  Lab 11/06/20 0807 11/06/20 0942 11/06/20 1210 11/06/20 1646 11/07/20 0822  GLUCAP 65* 98 113* 248* 128*     No results found for this or any previous visit (from the past 240 hour(s)).    Radiology Studies: No results found.  Scheduled Meds: . enoxaparin (LOVENOX) injection  40 mg Subcutaneous Daily  . feeding supplement  237 mL Oral TID BM  . memantine  10 mg Oral BID  . mirtazapine  7.5 mg Oral QHS  . pantoprazole  40 mg Oral Daily  . pyridostigmine  60 mg Oral TID  . QUEtiapine  12.5 mg Oral QHS   Continuous Infusions:    LOS: 11 days   Bonnielee Haff, MD [facility]  11/07/2020, 11:21 AM   To contact the attending provider between 7A-7P or the covering provider during after hours 7P-7A, please log into the web site [email or web address].

## 2020-11-08 ENCOUNTER — Inpatient Hospital Stay (HOSPITAL_COMMUNITY): Payer: Medicare PPO

## 2020-11-08 DIAGNOSIS — G309 Alzheimer's disease, unspecified: Secondary | ICD-10-CM | POA: Diagnosis not present

## 2020-11-08 DIAGNOSIS — F0281 Dementia in other diseases classified elsewhere with behavioral disturbance: Secondary | ICD-10-CM | POA: Diagnosis not present

## 2020-11-08 DIAGNOSIS — G9340 Encephalopathy, unspecified: Secondary | ICD-10-CM | POA: Diagnosis not present

## 2020-11-08 DIAGNOSIS — R4182 Altered mental status, unspecified: Secondary | ICD-10-CM | POA: Diagnosis not present

## 2020-11-08 LAB — BASIC METABOLIC PANEL
Anion gap: 9 (ref 5–15)
BUN: 26 mg/dL — ABNORMAL HIGH (ref 8–23)
CO2: 25 mmol/L (ref 22–32)
Calcium: 8.2 mg/dL — ABNORMAL LOW (ref 8.9–10.3)
Chloride: 102 mmol/L (ref 98–111)
Creatinine, Ser: 1.01 mg/dL — ABNORMAL HIGH (ref 0.44–1.00)
GFR, Estimated: 56 mL/min — ABNORMAL LOW (ref >60)
Glucose, Bld: 142 mg/dL — ABNORMAL HIGH (ref 70–99)
Potassium: 4 mmol/L (ref 3.5–5.1)
Sodium: 136 mmol/L (ref 135–145)

## 2020-11-08 LAB — BLOOD GAS, ARTERIAL
Acid-Base Excess: 1.3 mmol/L (ref 0.0–2.0)
Bicarbonate: 25.6 mmol/L (ref 20.0–28.0)
Drawn by: 36277
FIO2: 36
O2 Saturation: 83.8 %
Patient temperature: 37.6
pCO2 arterial: 43.3 mmHg (ref 32.0–48.0)
pH, Arterial: 7.393 (ref 7.350–7.450)
pO2, Arterial: 52.5 mmHg — ABNORMAL LOW (ref 83.0–108.0)

## 2020-11-08 LAB — HEPATIC FUNCTION PANEL
ALT: 26 U/L (ref 0–44)
AST: 22 U/L (ref 15–41)
Albumin: 2.3 g/dL — ABNORMAL LOW (ref 3.5–5.0)
Alkaline Phosphatase: 53 U/L (ref 38–126)
Bilirubin, Direct: 0.1 mg/dL (ref 0.0–0.2)
Indirect Bilirubin: 0.5 mg/dL (ref 0.3–0.9)
Total Bilirubin: 0.6 mg/dL (ref 0.3–1.2)
Total Protein: 5.3 g/dL — ABNORMAL LOW (ref 6.5–8.1)

## 2020-11-08 LAB — CBC
HCT: 44.3 % (ref 36.0–46.0)
Hemoglobin: 14.3 g/dL (ref 12.0–15.0)
MCH: 28.7 pg (ref 26.0–34.0)
MCHC: 32.3 g/dL (ref 30.0–36.0)
MCV: 88.8 fL (ref 80.0–100.0)
Platelets: 203 10*3/uL (ref 150–400)
RBC: 4.99 MIL/uL (ref 3.87–5.11)
RDW: 13.7 % (ref 11.5–15.5)
WBC: 9.5 10*3/uL (ref 4.0–10.5)
nRBC: 0 % (ref 0.0–0.2)

## 2020-11-08 LAB — GLUCOSE, CAPILLARY
Glucose-Capillary: 145 mg/dL — ABNORMAL HIGH (ref 70–99)
Glucose-Capillary: 110 mg/dL — ABNORMAL HIGH (ref 70–99)
Glucose-Capillary: 132 mg/dL — ABNORMAL HIGH (ref 70–99)
Glucose-Capillary: 169 mg/dL — ABNORMAL HIGH (ref 70–99)

## 2020-11-08 LAB — AMMONIA: Ammonia: 19 umol/L (ref 9–35)

## 2020-11-08 MED ORDER — SODIUM CHLORIDE 0.45 % IV SOLN: INTRAVENOUS | Status: AC

## 2020-11-08 MED ORDER — PREDNISONE 5 MG PO TABS
7.5000 mg | ORAL_TABLET | Freq: Every day | ORAL | Status: DC
  Administered 2020-11-11 – 2020-11-12 (×2): 7.5 mg via ORAL
  Filled 2020-11-08 (×2): qty 2

## 2020-11-08 MED ORDER — METHYLPREDNISOLONE SODIUM SUCC 40 MG IJ SOLR
40.0000 mg | Freq: Once | INTRAMUSCULAR | Status: AC
  Administered 2020-11-08: 40 mg via INTRAVENOUS
  Filled 2020-11-08: qty 1

## 2020-11-08 NOTE — Progress Notes (Signed)
I provided daughter [name] with further updates and received approval from my charge nurse Rodman Pickle for Tammy and her sister to come and visit patient for 2 hours today

## 2020-11-08 NOTE — Progress Notes (Signed)
Spoke to attending at bedside Dr Georgiana Spinner, patient is less responsive today, than previous 2 days, Dr Georgiana Spinner is going to order a CT scan

## 2020-11-08 NOTE — Procedures (Signed)
Patient Name: [name] [name]  MRN: [id number]  Epilepsy Attending: Charlsie Quest  Referring Physician/Provider: Dr Osvaldo Shipper Date: 11/08/2020 Duration: 25.01 mins  Patient history: 85yo F with ams. EEG to evaluate for seizure.   Level of alertness: Awake, asleep  AEDs during EEG study: None  Technical aspects: This EEG study was done with scalp electrodes positioned according to the 10-20 International system of electrode placement. Electrical activity was acquired at a sampling rate of 500Hz  and reviewed with a high frequency filter of 70Hz  and a low frequency filter of 1Hz . EEG data were recorded continuously and digitally stored.   Description: The posterior dominant rhythm consists of 8-9 Hz activity of moderate voltage (25-35 uV) seen predominantly in posterior head regions, symmetric and reactive to eye opening and eye closing. Sleep was characterized by vertex waves, sleep spindles (12 to 14 Hz), maximal frontocentral region. EEG showed continuous generalized 5 to 7 Hz theta slowing. Triphasic waves, generalized were also noted. Hyperventilation and photic stimulation were not performed.     ABNORMALITY -Continuous slow, generalized -Triphasic waves, generalized  IMPRESSION: This study is suggestive of mild diffuse encephalopathy, nonspecific etiology but likely related to toxic-metabolic etiology.  No seizures or epileptiform discharges were seen throughout the recording.  Sharrod Achille 

## 2020-11-08 NOTE — Progress Notes (Signed)
patient's daughter Hosie Poisson called to request Rapid Covid test.  Synetta Fail said they are working to get home health for patient due to her deteriorating status. The H/H nurse requires a negative Covid test before they can accept patient that is why she wants Covid test on patient.  Dr. Loney Loh was informed about this request and Dr. Loney Loh said to communicate this with Day shift doctor in [place].

## 2020-11-08 NOTE — Progress Notes (Signed)
RT called RRT on patient why RT came to draw ABG.

## 2020-11-08 NOTE — Progress Notes (Signed)
Awaiting straight catheter supplies to come to the floor they were ordered in order to collect urine specimen per Dr order.

## 2020-11-08 NOTE — Progress Notes (Signed)
Patients daughter [name] called for updates, I explained patient is not responding today and the Dr has ordered a CT scan and a MRI, Tammy wants to know if they can come see patient, I explained I would find out from the Dr and let her know

## 2020-11-08 NOTE — Significant Event (Addendum)
Rapid Response Event Note   Reason for Call :  Call by RT d/t decreased pt responsiveness and SpO2 dropping intermittently to upper 80s.   Initial Focused Assessment:  Pt lying in bed with eyes closed. Pt will only respond to painful stimuli. She will open her eyes, will not make eye contact, and will say "ow." Pt withdraw all extremities equally to pain. Pupils 4, equal, and brisk. Pt goes back to sleep quickly when not stimulated. Lungs diminished t/o. Skin warm and dry  T-99.7, HR-93, BP-102/64, RR-22, SpO2-93% on 7L [place]. SpO2 intermittently dropping to 85%.    Interventions:  Ammonia ABG-7.39/43.3/52.5/25.6-Pt placed on 10L HFNC  Plan of Care:  Pt decreased mental status addressed on day shift with CT, MRI, labs, and U/A. Further follow-up of her decreased LOC done with Ammonia level and ABG. PO2-52.5 low on ABG-pt FiO2 increased. Pt is currently protecting airway and responding to painful stimuli. ?? Whether pt has OSA d/t intermittent desating?? Continue to monitor pt closely. Call RRT if further assistance needed.    Event Summary:   MD Notified: Dr. Loney Loh notified PTA RRT Call [phone] Arrival [phone] End Time:2200  Terrilyn Saver, RN

## 2020-11-08 NOTE — TOC Progression Note (Signed)
Transition of Care [facility]) - Progression Note    Patient Details  Name: [name] [name] MRN: [id number] Date of Birth: [birth year]  [facility]) CM/SW Contact  Mearl Latin, LCSW Phone Number: 11/08/2020, 3:17 PM  Clinical Narrative:    Per Sheliah Hatch, a bed may be opening up soon. Will need updated PT note for insurance approval once patient able to participate.    Expected Discharge Plan: Skilled Nursing Facility Barriers to Discharge: Insurance Authorization,SNF Pending bed offer  Expected Discharge Plan and Services Expected Discharge Plan: Skilled Nursing Facility In-house Referral: Clinical Social Work Discharge Planning Services: CM Consult Post Acute Care Choice: Skilled Nursing Facility Living arrangements for the past 2 months: Single Family Home                                       Social Determinants of Health (SDOH) Interventions    Readmission Risk Interventions No flowsheet data found.

## 2020-11-08 NOTE — Progress Notes (Signed)
ABG result received and pt placed on 10l HFNC. Will continue to monitor

## 2020-11-08 NOTE — Progress Notes (Signed)
Patients daughters just left and they took patients clothing home that she came in with to wash, the did not take her glasses

## 2020-11-08 NOTE — Progress Notes (Addendum)
PROGRESS NOTE    [name] [name]  QGB:[id number] DOB: [birth year] DOA: 10/27/2020 PCP: Tonia Ghent, MD   Brief Narrative:   [name] [name] is a 85 y.o. female with known history of dementia and ocular myasthenia gravis was found to be increasingly confused by patient's daughter and was brought to the ER.  Per the report patient usually is ambulatory and able to dress herself.  Patient is lethargic, and confused on presentation, work-up for encephalopathy is negative, her work-up was significant for Covid pneumonia, and metabolic encephalopathy in the setting of Covid severe hospital delirium with underlining advanced dementia.   Subjective:  Patient noted to be poorly responsive this morning.  This is a new change compared to the last few days.  No hypoglycemia noted this morning.  Assessment & Plan:  Acute change mental status And acute change in mentation noted this morning.  CT scan of the head raises concern for an acute stroke.  Radiology also feels it could be an artifact.  However her mentation is quite different from yesterday.  Not noted to be hypoglycemic.  Rest of the other blood work seems to be stable.  Vital signs are stable.  She is afebrile.  We will order MRI.  Check a UA.  MRI without any acute stroke.  UA still pending.  Will order EEG.  Pneumonia due to COVID-19/acute respiratory failure with hypoxia  Patient is unvaccinated against COVID-19. Patient met sepsis criteria based on the source of infection/pneumonia with tachycardia and tachypnea.  She however had minimal oxygen requirements. Patient was treated with ceftriaxone and doxycycline for presumed bacterial pneumonia. Also treated with Remdesivir and steroids. Did not require Actemra or baricitinib. Dexamethasone being tapered off. CT angiogram ruled out pulmonary embolism. Can come off of isolation for COVID-19 on November 16, 2020. Had to be placed on 2 L of oxygen by nasal cannula due to mild hypoxia  overnight.  Acute Metabolic  encephalopathy:  Patient with underlying Alzheimer's dementia Patient noted to have significant delirium during this hospital stay.  Delirium had improved.  However as discussed above mentation appears to be worse today.  Previously done MRI, CT angiogram of head and neck were all are negative for any acute pathology.  Hypoglycemia No further episodes of hypoglycemia noted.  Continue to monitor CBGs.    History of myasthenia gravis:  Stable. Pyridostigmine was resumed.  History of Alzheimer's dementia:  Continue home medication   DVT prophylaxis: enoxaparin (LOVENOX) injection 40 mg Start: 10/28/20 1000  Code status: Full code Family Communication: Family being updated periodically Disposition: Waiting on SNF placement  Status is: Inpatient  Remains inpatient appropriate because:Inpatient level of care appropriate due to severity of illness   Dispo: The patient is from: Home              Anticipated d/c is to: SNF              Anticipated d/c date is: 1 day              Patient currently is medically stable to d/c.  Can go to SNF when bed is available     Consultants:   None  Procedures:   None  Antimicrobials:  Anti-infectives (From admission, onward)   Start     Dose/Rate Route Frequency Ordered Stop   10/28/20 1000  remdesivir 100 mg in sodium chloride 0.9 % 100 mL IVPB       "Followed by" Linked Group Details   100 mg  200 mL/hr over 30 Minutes Intravenous Daily 10/27/20 2244 10/31/20 1244   10/27/20 2245  remdesivir 200 mg in sodium chloride 0.9% 250 mL IVPB       "Followed by" Linked Group Details   200 mg 580 mL/hr over 30 Minutes Intravenous Once 10/27/20 2244 10/28/20 0107   10/27/20 2245  cefTRIAXone (ROCEPHIN) 2 g in sodium chloride 0.9 % 100 mL IVPB  Status:  Discontinued        2 g 200 mL/hr over 30 Minutes Intravenous Every 24 hours 10/27/20 2244 11/01/20 0931   10/27/20 2245  doxycycline (VIBRAMYCIN) 100 mg in sodium  chloride 0.9 % 250 mL IVPB  Status:  Discontinued        100 mg 125 mL/hr over 120 Minutes Intravenous Every 12 hours 10/27/20 2244 11/01/20 0931          Objective: Vitals:   11/08/20 0400 11/08/20 0748 11/08/20 0937 11/08/20 1055  BP: (!) 98/58  102/75 109/61  Pulse:   99 94  Resp: '19  20 20  ' Temp: 100.2 F (37.9 C)  99 F (37.2 C) 98.5 F (36.9 C)  TempSrc: Axillary  Axillary Axillary  SpO2: 94% 94% 98% 93%  Weight: 80.9 kg     Height:        Intake/Output Summary (Last 24 hours) at 11/08/2020 1117 Last data filed at 11/08/2020 0909 Gross per 24 hour  Intake 700 ml  Output 700 ml  Net 0 ml   Filed Weights   10/28/20 2122 11/08/20 0400  Weight: 83.1 kg 80.9 kg    Examination:  General appearance: Poorly responsive today.  Grimaces with painful stimuli Resp: Diminished air entry at the bases.  No wheezing or rhonchi. Cardio: S1-S2 is normal regular.  No S3-S4.  No rubs murmurs or bruit GI: Abdomen is soft.  Nontender nondistended.  Bowel sounds are present normal.  No masses organomegaly Extremities: No edema.   Neurologic: Not responsive today to verbal stimuli.  No obvious focal deficits noted.     Data Reviewed: I have personally reviewed following labs and imaging studies  CBC: Recent Labs  Lab 11/05/20 0245 11/08/20 0207  WBC 9.0 9.5  HGB 14.6 14.3  HCT 45.0 44.3  MCV 88.8 88.8  PLT 243 659   Basic Metabolic Panel: Recent Labs  Lab 11/03/20 0455 11/05/20 0245 11/08/20 0207  NA  --  141 136  K  --  4.2 4.0  CL  --  104 102  CO2  --  27 25  GLUCOSE  --  170* 142*  BUN  --  25* 26*  CREATININE 1.04* 1.02* 1.01*  CALCIUM  --  8.5* 8.2*   GFR: Estimated Creatinine Clearance: 47 mL/min (A) (by C-G formula based on SCr of 1.01 mg/dL (H)).  CBG: Recent Labs  Lab 11/07/20 0822 11/07/20 1245 11/07/20 1759 11/07/20 2118 11/08/20 0804  GLUCAP 128* 149* 132* 155* 145*       Radiology Studies: CT HEAD WO CONTRAST  Result Date:  11/08/2020 CLINICAL DATA:  Mental status change with unknown cause. EXAM: CT HEAD WITHOUT CONTRAST TECHNIQUE: Contiguous axial images were obtained from the base of the skull through the vertex without intravenous contrast. COMPARISON:  10/27/2020 FINDINGS: Brain: The right pons appears low-density on a few slices, but limited by typical posterior fossa artifact with band of low-density seen on sagittal reformats. Cerebral atrophy and chronic small vessel ischemia. Volume loss is especially notable in the temporal lobes. 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 Confluent chronic small vessel ischemia in the cerebral white matter. Vascular: Atheromatous calcification. Skull: No acute or aggressive finding. Sinuses/Orbits: Mild generalized mucosal thickening in the paranasal sinuses. Bilateral cataract resection. IMPRESSION: 1. Artifact versus infarct in the right pons, favor artifact. Please correlate for bulbar symptoms. 2. Brain atrophy and chronic small vessel ischemia. Electronically Signed   By: Monte Fantasia M.D.   On: 11/08/2020 10:49    Scheduled Meds: . enoxaparin (LOVENOX) injection  40 mg Subcutaneous Daily  . feeding supplement  237 mL Oral TID BM  . memantine  10 mg Oral BID  . mirtazapine  7.5 mg Oral QHS  . pantoprazole  40 mg Oral Daily  . pyridostigmine  60 mg Oral TID  . QUEtiapine  12.5 mg Oral QHS   Continuous Infusions:    LOS: 12 days   Bonnielee Haff, MD [facility]  11/08/2020, 11:17 AM   To contact the attending provider between 7A-7P or the covering provider during after hours 7P-7A, please log into the web site [email or web address].

## 2020-11-08 NOTE — Progress Notes (Signed)
EEG completed, results pending. 

## 2020-11-09 ENCOUNTER — Inpatient Hospital Stay (HOSPITAL_COMMUNITY): Payer: Medicare PPO

## 2020-11-09 ENCOUNTER — Encounter (HOSPITAL_COMMUNITY): Payer: Self-pay | Admitting: Internal Medicine

## 2020-11-09 ENCOUNTER — Encounter: Payer: Self-pay | Admitting: Family Medicine

## 2020-11-09 DIAGNOSIS — F0281 Dementia in other diseases classified elsewhere with behavioral disturbance: Secondary | ICD-10-CM | POA: Diagnosis not present

## 2020-11-09 DIAGNOSIS — R4182 Altered mental status, unspecified: Secondary | ICD-10-CM | POA: Diagnosis not present

## 2020-11-09 DIAGNOSIS — G309 Alzheimer's disease, unspecified: Secondary | ICD-10-CM | POA: Diagnosis not present

## 2020-11-09 LAB — URINALYSIS, ROUTINE W REFLEX MICROSCOPIC
Bilirubin Urine: NEGATIVE
Glucose, UA: 50 mg/dL — AB
Hgb urine dipstick: NEGATIVE
Ketones, ur: 5 mg/dL — AB
Leukocytes,Ua: NEGATIVE
Nitrite: NEGATIVE
Protein, ur: 100 mg/dL — AB
Specific Gravity, Urine: 1.025 (ref 1.005–1.030)
pH: 5 (ref 5.0–8.0)

## 2020-11-09 LAB — COMPREHENSIVE METABOLIC PANEL
ALT: 31 U/L (ref 0–44)
AST: 27 U/L (ref 15–41)
Albumin: 2 g/dL — ABNORMAL LOW (ref 3.5–5.0)
Alkaline Phosphatase: 47 U/L (ref 38–126)
Anion gap: 12 (ref 5–15)
BUN: 30 mg/dL — ABNORMAL HIGH (ref 8–23)
CO2: 23 mmol/L (ref 22–32)
Calcium: 7.8 mg/dL — ABNORMAL LOW (ref 8.9–10.3)
Chloride: 101 mmol/L (ref 98–111)
Creatinine, Ser: 1.05 mg/dL — ABNORMAL HIGH (ref 0.44–1.00)
GFR, Estimated: 53 mL/min — ABNORMAL LOW (ref >60)
Glucose, Bld: 189 mg/dL — ABNORMAL HIGH (ref 70–99)
Potassium: 3.8 mmol/L (ref 3.5–5.1)
Sodium: 136 mmol/L (ref 135–145)
Total Bilirubin: 0.5 mg/dL (ref 0.3–1.2)
Total Protein: 5.2 g/dL — ABNORMAL LOW (ref 6.5–8.1)

## 2020-11-09 LAB — CBC
HCT: 41.1 % (ref 36.0–46.0)
Hemoglobin: 13.8 g/dL (ref 12.0–15.0)
MCH: 29.9 pg (ref 26.0–34.0)
MCHC: 33.6 g/dL (ref 30.0–36.0)
MCV: 89 fL (ref 80.0–100.0)
Platelets: 175 10*3/uL (ref 150–400)
RBC: 4.62 MIL/uL (ref 3.87–5.11)
RDW: 13.5 % (ref 11.5–15.5)
WBC: 9.4 10*3/uL (ref 4.0–10.5)
nRBC: 0 % (ref 0.0–0.2)

## 2020-11-09 LAB — BRAIN NATRIURETIC PEPTIDE: B Natriuretic Peptide: 59.9 pg/mL (ref 0.0–100.0)

## 2020-11-09 LAB — GLUCOSE, CAPILLARY
Glucose-Capillary: 176 mg/dL — ABNORMAL HIGH (ref 70–99)
Glucose-Capillary: 185 mg/dL — ABNORMAL HIGH (ref 70–99)
Glucose-Capillary: 130 mg/dL — ABNORMAL HIGH (ref 70–99)
Glucose-Capillary: 127 mg/dL — ABNORMAL HIGH (ref 70–99)
Glucose-Capillary: 143 mg/dL — ABNORMAL HIGH (ref 70–99)

## 2020-11-09 LAB — SARS CORONAVIRUS 2 (TAT 6-24 HRS): SARS Coronavirus 2: POSITIVE — AB

## 2020-11-09 MED ORDER — SODIUM CHLORIDE 0.45 % IV SOLN: INTRAVENOUS | Status: AC

## 2020-11-09 MED ORDER — METHYLPREDNISOLONE SODIUM SUCC 40 MG IJ SOLR
40.0000 mg | Freq: Once | INTRAMUSCULAR | Status: AC
  Administered 2020-11-09: 40 mg via INTRAVENOUS
  Filled 2020-11-09: qty 1

## 2020-11-09 MED ORDER — SODIUM CHLORIDE 0.9 % IV SOLN
3.0000 g | Freq: Three times a day (TID) | INTRAVENOUS | Status: DC
  Administered 2020-11-09 – 2020-11-12 (×9): 3 g via INTRAVENOUS
  Filled 2020-11-09 (×3): qty 8
  Filled 2020-11-09: qty 3
  Filled 2020-11-09 (×4): qty 8
  Filled 2020-11-09: qty 3
  Filled 2020-11-09 (×2): qty 8

## 2020-11-09 MED ORDER — METHYLPREDNISOLONE SODIUM SUCC 40 MG IJ SOLR
40.0000 mg | INTRAMUSCULAR | Status: DC
  Administered 2020-11-10 – 2020-11-11 (×2): 40 mg via INTRAVENOUS
  Filled 2020-11-09 (×2): qty 1

## 2020-11-09 MED ORDER — IOHEXOL 350 MG/ML SOLN
75.0000 mL | Freq: Once | INTRAVENOUS | Status: AC | PRN
  Administered 2020-11-09: 75 mL via INTRAVENOUS

## 2020-11-09 NOTE — Progress Notes (Signed)
Physical Therapy Treatment Patient Details Name: [name] [name] MRN: [id number] DOB: [birth year] Today's Date: 11/09/2020    History of Present Illness Pt is an 85 y.o. female admitted 10/27/20 with AMS; workup for acute metabolic encephalopathy, acute hypoxic respiratory failure secondary to COVID-19 PNA. Course complicated by AMS and hypoxia. EEG 1/10 suggestive of mild diffuse encephalpathy; no seizures. Repeat head CT/MRI 1/10 negative for acute injury. CTA negative for PE. PMH includes dementia, myasthenia gravis.   PT Comments    Pt progressing with mobility. Pt alert and interactive this session, motivated to get out of bed; pleasantly confused throughout session. Pt requiring up to modA for standing activity with RW this session. Pt unaware of bladder/bowel incontinence, dependent for pericare while standing. Pt remains limited by generalized weakness, decreased activity tolerance and cognitive impairment. Per chart, family plans to have pt return home; pt will require 24/7 assist for safety and mobility, also recommend [facility] services.  SpO2 86-93% on 9L O2 HFNC   Follow Up Recommendations  SNF;Supervision/Assistance - 24 hour     Equipment Recommendations  Wheelchair (measurements PT);Wheelchair cushion (measurements PT);Hospital bed    Recommendations for Other Services       Precautions / Restrictions Precautions Precautions: Fall;Other (comment) Precaution Comments: Bowel/bladder incontinence Restrictions Weight Bearing Restrictions: No    Mobility  Bed Mobility Overal bed mobility: Needs Assistance Bed Mobility: Supine to Sit     Supine to sit: Mod assist     General bed mobility comments: Frequent cues to initiate and attend to task; modA for trunk elevation and scooting hips to EOB  Transfers Overall transfer level: Needs assistance Equipment used: Rolling walker (2 wheeled) Transfers: Sit to/from Stand Sit to Stand: Mod assist         General transfer  comment: Multiple sit<>stands from EOB and recliner with RW, initial modA for trunk elevation, progression to minA during subsequent trials; pt unaware of bladder/bowel incontinence. Repeated cues for hand placement and sequencing  Ambulation/Gait Ambulation/Gait assistance: Min assist Gait Distance (Feet): 2 Feet Assistive device: Rolling walker (2 wheeled) Gait Pattern/deviations: Step-to pattern;Trunk flexed Gait velocity: Decreased   General Gait Details: Slow, unsteady gait with RW and intermittent minA for stability and RW navigation; further mobility limited by fatigue after multiple stands for Manufacturing engineer    Modified Rankin (Stroke Patients Only)       Balance Overall balance assessment: Needs assistance Sitting-balance support: Bilateral upper extremity supported;Feet supported Sitting balance-Leahy Scale: Fair     Standing balance support: Bilateral upper extremity supported;During functional activity Standing balance-Leahy Scale: Poor Standing balance comment: Reliant on UE support and external assist; dependent for pericare                            Cognition Arousal/Alertness: Awake/alert Behavior During Therapy: WFL for tasks assessed/performed Overall Cognitive Status: History of cognitive impairments - at baseline Area of Impairment: Orientation;Attention;Memory;Following commands;Safety/judgement;Awareness;Problem solving                 Orientation Level: Disoriented to;Place;Time;Situation Current Attention Level: Sustained Memory: Decreased short-term memory Following Commands: Follows one step commands inconsistently;Follows one step commands with increased time Safety/Judgement: Decreased awareness of safety;Decreased awareness of deficits Awareness: Intellectual Problem Solving: Difficulty sequencing;Requires verbal cues;Decreased initiation General Comments: Pt pleasantly  confused throughout session; requires frequent redirection to task, frequent repetition, max cues for safety  Exercises      General Comments General comments (skin integrity, edema, etc.): SpO2 93% on 9L O2 HFNC at rest; down to 86% with activity (unreliable pleth), DOE 2-3/4      Pertinent Vitals/Pain Pain Assessment: No/denies pain Pain Intervention(s): Monitored during session    Home Living                      Prior Function            PT Goals (current goals can now be found in the care plan section) Progress towards PT goals: Progressing toward goals    Frequency    Min 2X/week      PT Plan Current plan remains appropriate    Co-evaluation              AM-PAC PT "6 Clicks" Mobility   Outcome Measure  Help needed turning from your back to your side while in a flat bed without using bedrails?: A Lot Help needed moving from lying on your back to sitting on the side of a flat bed without using bedrails?: A Lot Help needed moving to and from a bed to a chair (including a wheelchair)?: A Lot Help needed standing up from a chair using your arms (e.g., wheelchair or bedside chair)?: A Lot Help needed to walk in hospital room?: A Little Help needed climbing 3-5 steps with a railing? : Total 6 Click Score: 12    End of Session Equipment Utilized During Treatment: Gait belt;Oxygen Activity Tolerance: Patient tolerated treatment well Patient left: in chair;with call bell/phone within reach;with chair alarm set Nurse Communication: Mobility status (purewick soiled, will need new one due to incontinence) PT Visit Diagnosis: Unsteadiness on feet (R26.81);Other abnormalities of gait and mobility (R26.89);Muscle weakness (generalized) (M62.81);Difficulty in walking, not elsewhere classified (R26.2)     Time: 9935-7017 PT Time Calculation (min) (ACUTE ONLY): 26 min  Charges:  $Therapeutic Activity: 23-37 mins                    Ina Homes, PT,  DPT Acute Rehabilitation Services  Pager [phone] Office [phone]  Malachy Chamber 11/09/2020, 4:51 PM

## 2020-11-09 NOTE — Plan of Care (Signed)
  Problem: Clinical Measurements: Goal: Will remain free from infection Outcome: Progressing Goal: Diagnostic test results will improve Outcome: Progressing   Problem: Activity: Goal: Risk for activity intolerance will decrease Outcome: Progressing   Problem: Nutrition: Goal: Adequate nutrition will be maintained Outcome: Progressing   Problem: Education: Goal: Knowledge of risk factors and measures for prevention of condition will improve Outcome: Progressing

## 2020-11-09 NOTE — TOC Progression Note (Signed)
Transition of Care [facility]) - Progression Note    Patient Details  Name: LAILAH MARCELLI MRN: [id number] Date of Birth: 1936-06-20  Transition of [facility]) CM/SW Contact  Mearl Latin, LCSW Phone Number: 11/09/2020, 9:17 AM  Clinical Narrative:    CSW received call from patient's daughter, Synetta Fail. CSW returned call and left voicemail.    Expected Discharge Plan: Skilled Nursing Facility Barriers to Discharge: Insurance Authorization,SNF Pending bed offer  Expected Discharge Plan and Services Expected Discharge Plan: Skilled Nursing Facility In-house Referral: Clinical Social Work Discharge Planning Services: CM Consult Post Acute Care Choice: Skilled Nursing Facility Living arrangements for the past 2 months: Single Family Home                                       Social Determinants of Health (SDOH) Interventions    Readmission Risk Interventions No flowsheet data found.

## 2020-11-09 NOTE — Progress Notes (Addendum)
PROGRESS NOTE    [name] [name]  TFT:[id number] DOB: 1936-01-18 DOA: 10/27/2020 PCP: Tonia Ghent, MD   Brief Narrative:   [name] [name] is a 85 y.o. female with known history of dementia and ocular myasthenia gravis was found to be increasingly confused by patient's daughter and was brought to the ER.  Per the report patient usually is ambulatory and able to dress herself.  Patient is lethargic, and confused on presentation, work-up for encephalopathy is negative, her work-up was significant for Covid pneumonia, and metabolic encephalopathy in the setting of Covid severe hospital delirium with underlining advanced dementia.   Subjective:  Patient noted to be a little bit more responsive today compared to yesterday.  Still not communicative like she was a few days ago.  Assessment & Plan:  Acute change mental status Patient noted to have an acute change in her mental status yesterday morning.  She underwent CT scan head followed by MRI brain which did not show any acute infarct.  EEG was done which did not show any epileptiform activity.  UA did not suggest infection.  Findings on chest x-ray could be reflective of her recent COVID-19 infection.  It was brought to my attention yesterday that patient takes prednisone on a regular basis at home.  Patient was getting dexamethasone for COVID-19 up until 1/8.  She did not get any steroids on 1/9.  She was given Solu-Medrol yesterday and will be given again today.  Unlikely that 1 day of not getting steroids caused her to have alteration in mental status. Discussed with daughter who is concerned about her mentation.  She would like to get her home as quickly as possible.    Acute respiratory failure with hypoxia in the setting of recent COVID-19 Patient presented with COVID-19 infection and pneumonia.  She was treated with remdesivir and steroids.  She was also given antibacterials.  She underwent CT angiogram at the time of admission which  ruled out a pulmonary embolism.  However overnight her oxygenation worsened.  She is currently on 10 L of oxygen.  Saturations noted to be in the late 90s.  Cut down to 8 L.  Hopefully will continue to wean down.  Reason for sudden hypoxia is not clear.  We will do a CT angiogram to rule out PE.  CTA was negative for PE. Findings of Covid pneumonia noted. It is possible she may have aspirated. We will keep her on schedule steroids for now.  After several hours of observation we have been unable to wean her down.  May not be unreasonable to start her on Unasyn.  Pneumonia due to COVID-19 Patient is unvaccinated against COVID-19. Patient met sepsis criteria based on the source of infection/pneumonia with tachycardia and tachypnea.  She however had minimal oxygen requirements. Patient was treated with ceftriaxone and doxycycline for presumed bacterial pneumonia. Also treated with Remdesivir and steroids. Did not require Actemra or baricitinib. Dexamethasone being tapered off. Can come off of isolation for COVID-19 on November 16, 2020.  Acute Metabolic  encephalopathy:  Patient with underlying Alzheimer's dementia Patient noted to have significant delirium during this hospital stay.  Delirium had improved.   See discussion above as well.   Previously done MRI, CT angiogram of head and neck were all are negative for any acute pathology.  Hypoglycemia No further episodes of hypoglycemia noted.  Continue to monitor CBGs.    History of myasthenia gravis:  Stable. Pyridostigmine was resumed.  Patient apparently also on prednisone 7.28m  daily prior to admission.  Currently on Solu-Medrol as oral intake is poor due to alteration in mental status.  History of Alzheimer's dementia:  Continue home medication  Goals of care Long discussion with Tammy, one of the patient's daughter.  Prognosis is guarded.  Reason for patient's altered mental status is not clear.  Probably related to her underlying  dementia.  Work-up negative so far.  Daughter wants the patient home as soon as possible.  She was told about patient's medical instability due to high oxygen requirements.  She was also asked about patient's advanced directives.  She would like her to be changed to DNR.  She is interested in hospice services.  Social worker will be notified.  Patient's daughters seemed conflicted about how they want to proceed.  They would still like for the patient to be treated for her acute condition.  I told them that while patient is under hospice care the main focus will be treat patient's symptoms rather than managing underlying condition.  Active intravenous treatments will not be provided when she is at home.  They are going to visit the patient this evening and they will decide further course of action.  DVT prophylaxis: enoxaparin (LOVENOX) injection 40 mg Start: 10/28/20 1000  Code status: DNR Family Communication: Discussed with patient's daughter today.  She would like for the patient to come home. Disposition: Family wants patient home now instead of SNF.  Hospice services will be involved.  Status is: Inpatient  Remains inpatient appropriate because:Altered mental status   Dispo:  Patient From: Home  Planned Disposition: Home Hospice  Expected discharge date: 11/11/2020  Medically stable for discharge: No      Consultants:   None  Procedures:   None  Antimicrobials:  Anti-infectives (From admission, onward)   Start     Dose/Rate Route Frequency Ordered Stop   10/28/20 1000  remdesivir 100 mg in sodium chloride 0.9 % 100 mL IVPB       "Followed by" Linked Group Details   100 mg 200 mL/hr over 30 Minutes Intravenous Daily 10/27/20 2244 10/31/20 1244   10/27/20 2245  remdesivir 200 mg in sodium chloride 0.9% 250 mL IVPB       "Followed by" Linked Group Details   200 mg 580 mL/hr over 30 Minutes Intravenous Once 10/27/20 2244 10/28/20 0107   10/27/20 2245  cefTRIAXone (ROCEPHIN)  2 g in sodium chloride 0.9 % 100 mL IVPB  Status:  Discontinued        2 g 200 mL/hr over 30 Minutes Intravenous Every 24 hours 10/27/20 2244 11/01/20 0931   10/27/20 2245  doxycycline (VIBRAMYCIN) 100 mg in sodium chloride 0.9 % 250 mL IVPB  Status:  Discontinued        100 mg 125 mL/hr over 120 Minutes Intravenous Every 12 hours 10/27/20 2244 11/01/20 0931          Objective: Vitals:   11/08/20 2300 11/09/20 0003 11/09/20 0400 11/09/20 0741  BP: 120/71 99/80 103/63 (!) 97/59  Pulse: 89 84 70 60  Resp: (!) 23 20 (!) 21 20  Temp: 98.2 F (36.8 C) 97.6 F (36.4 C) 97.9 F (36.6 C)   TempSrc: Axillary Axillary Axillary Axillary  SpO2: 95% 96% 96% 96%  Weight:      Height:        Intake/Output Summary (Last 24 hours) at 11/09/2020 1051 Last data filed at 11/09/2020 0549 Gross per 24 hour  Intake 947.31 ml  Output 401 ml  Net  546.31 ml   Filed Weights   10/28/20 2122 11/08/20 0400  Weight: 83.1 kg 80.9 kg    Examination:  General appearance: Remains poorly responsive.  But better than yesterday. Resp: Diminished air entry at the bases.  No wheezing or rhonchi.  No crackles appreciated. Cardio: S1-S2 is normal regular.  No S3-S4.  No rubs murmurs or bruit GI: Abdomen is soft.  Nontender nondistended.  Bowel sounds are present normal.  No masses organomegaly Extremities: No edema.  Noted to be moving all of her extremities Neurologic: More responsive today compared to yesterday.  No obvious focal deficits for     Data Reviewed: I have personally reviewed following labs and imaging studies  CBC: Recent Labs  Lab 11/05/20 0245 11/08/20 0207 11/09/20 0246  WBC 9.0 9.5 9.4  HGB 14.6 14.3 13.8  HCT 45.0 44.3 41.1  MCV 88.8 88.8 89.0  PLT 243 203 837   Basic Metabolic Panel: Recent Labs  Lab 11/03/20 0455 11/05/20 0245 11/08/20 0207 11/09/20 0640  NA  --  141 136 136  K  --  4.2 4.0 3.8  CL  --  104 102 101  CO2  --  _0 GLUCOSE  --  170* 142* 189*   BUN  --  25* 26* 30*  CREATININE 1.04* 1.02* 1.01* 1.05*  CALCIUM  --  8.5* 8.2* 7.8*   GFR: Estimated Creatinine Clearance: 45.2 mL/min (A) (by C-G formula based on SCr of 1.05 mg/dL (H)).  CBG: Recent Labs  Lab 11/08/20 1342 11/08/20 1753 11/08/20 2045 11/09/20 0640 11/09/20 0747  GLUCAP 110* 132* 169* 176* 185*       Radiology Studies: CT HEAD WO CONTRAST  Result Date: 11/08/2020 CLINICAL DATA:  Mental status change with unknown cause. EXAM: CT HEAD WITHOUT CONTRAST TECHNIQUE: Contiguous axial images were obtained from the base of the skull through the vertex without intravenous contrast. COMPARISON:  10/27/2020 FINDINGS: Brain: The right pons appears low-density on a few slices, but limited by typical posterior fossa artifact with band of low-density seen on sagittal reformats. Cerebral atrophy and chronic small vessel ischemia. Volume loss is especially notable in the temporal lobes. 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 Confluent chronic small vessel ischemia in the cerebral white matter. Vascular: Atheromatous calcification. Skull: No acute or aggressive finding. Sinuses/Orbits: Mild generalized mucosal thickening in the paranasal sinuses. Bilateral cataract resection. IMPRESSION: 1. Artifact versus infarct in the right pons, favor artifact. Please correlate for bulbar symptoms. 2. Brain atrophy and chronic small vessel ischemia. Electronically Signed   By: Monte Fantasia M.D.   On: 11/08/2020 10:49   MR BRAIN WO CONTRAST  Result Date: 11/08/2020 CLINICAL DATA:  Acute neuro deficit rule out stroke. EXAM: MRI HEAD WITHOUT CONTRAST TECHNIQUE: Multiplanar, multiecho pulse sequences of the brain and surrounding structures were obtained without intravenous contrast. COMPARISON:  CT head 11/08/2020.  MRI head 10/27/2020 FINDINGS: Brain: Negative for acute infarct Moderate to advanced atrophy most prominent in the frontal and temporal  lobes. Ventricular enlargement consistent with atrophy. Moderate white matter changes most consistent with chronic ischemia. Negative for hemorrhage or mass. Vascular: Normal arterial flow voids. Skull and upper cervical spine: No focal skeletal lesion. Sinuses/Orbits: Mild mucosal edema paranasal sinuses. Bilateral cataract extraction Other: None IMPRESSION: Negative for acute infarct. Stable atrophy and chronic white matter changes. Electronically Signed   By: Franchot Gallo M.D.   On: 11/08/2020 12:44   DG CHEST PORT 1 VIEW  Result Date: 11/08/2020 CLINICAL DATA:  Acute hypoxic respiratory failure EXAM: PORTABLE CHEST 1 VIEW COMPARISON:  October 27, 2020 FINDINGS: Heart size is stable but enlarged. There are aortic calcifications. There are small bilateral pleural effusions. There are streaky bibasilar airspace opacities. There are prominent interstitial lung markings. There is no pneumothorax. IMPRESSION: 1. Prominent interstitial lung markings with small bilateral pleural effusions. Findings are suggestive of interstitial edema. However, a developing atypical infectious process is not excluded. 2. Stable cardiac silhouette with atherosclerotic changes of the thoracic aorta. Electronically Signed   By: Constance Holster M.D.   On: 11/08/2020 23:11   EEG adult  Result Date: 11/08/2020 Lora Havens, MD     11/08/2020  5:40 PM Patient Name: TOIA MICALE MRN: [id number] Epilepsy Attending: Lora Havens Referring Physician/Provider: Dr Bonnielee Haff Date: 11/08/2020 Duration: 25.01 mins Patient history: 85yo F with ams. EEG to evaluate for seizure. Level of alertness: Awake, asleep AEDs during EEG study: None Technical aspects: This EEG study was done with scalp electrodes positioned according to the 10-20 International system of electrode placement. Electrical activity was acquired at a sampling rate of 500Hz and reviewed with a high frequency filter of 70Hz and a low frequency filter of 1Hz. EEG  data were recorded continuously and digitally stored. Description: The posterior dominant rhythm consists of 8-9 Hz activity of moderate voltage (25-35 uV) seen predominantly in posterior head regions, symmetric and reactive to eye opening and eye closing. Sleep was characterized by vertex waves, sleep spindles (12 to 14 Hz), maximal frontocentral region. EEG showed continuous generalized 5 to 7 Hz theta slowing. Triphasic waves, generalized were also noted. Hyperventilation and photic stimulation were not performed.   ABNORMALITY -Continuous slow, generalized -Triphasic waves, generalized IMPRESSION: This study is suggestive of mild diffuse encephalopathy, nonspecific etiology but likely related to toxic-metabolic etiology. No seizures or epileptiform discharges were seen throughout the recording. Priyanka Barbra Sarks    Scheduled Meds: . enoxaparin (LOVENOX) injection  40 mg Subcutaneous Daily  . feeding supplement  237 mL Oral TID BM  . memantine  10 mg Oral BID  . methylPREDNISolone (SOLU-MEDROL) injection  40 mg Intravenous Once  . mirtazapine  7.5 mg Oral QHS  . pantoprazole  40 mg Oral Daily  . predniSONE  7.5 mg Oral Q breakfast  . pyridostigmine  60 mg Oral TID  . QUEtiapine  12.5 mg Oral QHS   Continuous Infusions: . sodium chloride 75 mL/hr at 11/09/20 0601     LOS: 13 days   Bonnielee Haff, MD [facility]  11/09/2020, 10:51 AM   To contact the attending provider between 7A-7P or the covering provider during after hours 7P-7A, please log into the web site [email or web address].

## 2020-11-09 NOTE — Progress Notes (Signed)
Pharmacy Antibiotic Note  KAMERIN AXFORD is a 85 y.o. female admitted on 10/27/2020 with AMS.  Pharmacy has been consulted for Unasyn dosing for aspiration pneumonia. CTA chest suggests a multifocal pneumonia. Tm 99.7, WBC are normal, and renal function is stable.  Plan: Unasyn 3 g IV q8h Monitor renal function, clinical progress, and C/S GOC discussion ongoing   Height: 5\' 7"  (170.2 cm) Weight: 80.9 kg (178 lb 5.6 oz) IBW/kg (Calculated) : 61.6  Temp (24hrs), Avg:98.4 F (36.9 C), Min:97.6 F (36.4 C), Max:99.7 F (37.6 C)  Recent Labs  Lab 11/03/20 0455 11/05/20 0245 11/08/20 0207 11/09/20 0246 11/09/20 0640  WBC  --  9.0 9.5 9.4  --   CREATININE 1.04* 1.02* 1.01*  --  1.05*    Estimated Creatinine Clearance: 45.2 mL/min (A) (by C-G formula based on SCr of 1.05 mg/dL (H)).    Allergies  Allergen Reactions  . Alendronate Sodium Other (See Comments)    Upset stomach  . Donepezil Hcl Other (See Comments)    Intolerant  . Vesicare [Solifenacin] Other (See Comments)    dysuria  . Sulfa Antibiotics Rash  . Sulfasalazine Hives and Rash  . Tetanus Toxoids Rash    Thank you for involving pharmacy in this patient's care.  01/07/21, PharmD, BCPS Clinical Pharmacist Clinical phone for 11/09/2020 until 10p is [phone] 11/09/2020 5:43 PM  **Pharmacist phone directory can be found on [email or web address] listed under [facility]**

## 2020-11-10 DIAGNOSIS — U071 COVID-19: Secondary | ICD-10-CM | POA: Diagnosis not present

## 2020-11-10 DIAGNOSIS — J9600 Acute respiratory failure, unspecified whether with hypoxia or hypercapnia: Secondary | ICD-10-CM | POA: Diagnosis not present

## 2020-11-10 LAB — COMPREHENSIVE METABOLIC PANEL
ALT: 44 U/L (ref 0–44)
AST: 39 U/L (ref 15–41)
Albumin: 2.2 g/dL — ABNORMAL LOW (ref 3.5–5.0)
Alkaline Phosphatase: 49 U/L (ref 38–126)
Anion gap: 10 (ref 5–15)
BUN: 32 mg/dL — ABNORMAL HIGH (ref 8–23)
CO2: 27 mmol/L (ref 22–32)
Calcium: 8.1 mg/dL — ABNORMAL LOW (ref 8.9–10.3)
Chloride: 103 mmol/L (ref 98–111)
Creatinine, Ser: 1.08 mg/dL — ABNORMAL HIGH (ref 0.44–1.00)
GFR, Estimated: 51 mL/min — ABNORMAL LOW (ref >60)
Glucose, Bld: 122 mg/dL — ABNORMAL HIGH (ref 70–99)
Potassium: 3.8 mmol/L (ref 3.5–5.1)
Sodium: 140 mmol/L (ref 135–145)
Total Bilirubin: 0.4 mg/dL (ref 0.3–1.2)
Total Protein: 5.4 g/dL — ABNORMAL LOW (ref 6.5–8.1)

## 2020-11-10 LAB — CBC
HCT: 40.4 % (ref 36.0–46.0)
Hemoglobin: 12.9 g/dL (ref 12.0–15.0)
MCH: 28.4 pg (ref 26.0–34.0)
MCHC: 31.9 g/dL (ref 30.0–36.0)
MCV: 89 fL (ref 80.0–100.0)
Platelets: 196 10*3/uL (ref 150–400)
RBC: 4.54 MIL/uL (ref 3.87–5.11)
RDW: 12.9 % (ref 11.5–15.5)
WBC: 11.1 10*3/uL — ABNORMAL HIGH (ref 4.0–10.5)
nRBC: 0 % (ref 0.0–0.2)

## 2020-11-10 LAB — URINE CULTURE: Culture: NO GROWTH

## 2020-11-10 LAB — GLUCOSE, CAPILLARY
Glucose-Capillary: 115 mg/dL — ABNORMAL HIGH (ref 70–99)
Glucose-Capillary: 129 mg/dL — ABNORMAL HIGH (ref 70–99)

## 2020-11-10 LAB — D-DIMER, QUANTITATIVE: D-Dimer, Quant: 0.94 ug/mL-FEU — ABNORMAL HIGH (ref 0.00–0.50)

## 2020-11-10 NOTE — Plan of Care (Signed)
  Problem: Clinical Measurements: Goal: Ability to maintain clinical measurements within normal limits will improve Outcome: Progressing Goal: Will remain free from infection Outcome: Progressing   Problem: Coping: Goal: Level of anxiety will decrease Outcome: Progressing   Problem: Elimination: Goal: Will not experience complications related to bowel motility Outcome: Progressing

## 2020-11-10 NOTE — Progress Notes (Signed)
PROGRESS NOTE    [name] [name]  MRN:[id number] DOB: 03/04/1936 DOA: 10/27/2020 PCP: Duncan, Graham S, MD  Brief Narrative:  [name] [name] is a 84 y.o. female with known history of dementia and ocular myasthenia gravis was found to be increasingly confused by patient's daughter and was brought to the ER. Per the report patient usually is ambulatory and able to dress herself. Patient is lethargic, and confused on presentation, work-up for encephalopathy is negative, her work-up was significant for Covid pneumonia, and metabolic encephalopathy in the setting of Covid severe hospital delirium with underlining advanced dementia.  Subjective: No acute issues or events overnight, much more awake and alert this morning, review of systems somewhat limited given patient's confusion and poor conversation/somewhat rambling speech.  Assessment & Plan:  Acute respiratory failure with hypoxia in the setting of acute aspiration pneumonia/pneumonitis with recent COVID-19 PNA Recent likely acute aspiration event 11/08/20 - unasyn to cover for 5 days Patient presented with COVID-19 infection and pneumonia.  Completed remdesivir  Continue to wean steroids - on 7.5mg prednisone at baseline.  CTA negative for PE Continue to wean - currently 8 L  Pneumonia due to COVID-19 Patient is unvaccinated against COVID-19. Patient met sepsis criteria based on the source of bacterial infection/pneumonia with tachycardia and tachypnea.  She however had minimal oxygen requirements. Patient was treated with ceftriaxone and doxycycline for presumed concurrent bacterial pneumonia. Also treated with Remdesivir and steroids. Did not receive Actemra or baricitinib given ?bacterial infection Continue steroid taper Can come off of isolation for COVID-19 on November 16, 2020  Acute metabolic encephalopathy on baseline dementia:  Patient with underlying Alzheimer's dementia Patient noted to have significant delirium during this  hospital stay.  Delirium had improved.   Baseline alert to person and distant/remote events only. Has trouble with current affairs and even recognizing her own children/family. Previously done MRI, CT angiogram of head and neck were all are negative for any acute pathology.  Hypoglycemia No further episodes of hypoglycemia noted.  Continue to monitor CBGs.    History of myasthenia gravis:  Stable. Pyridostigmine was resumed.  Patient apparently also on prednisone 7.5mg daily prior to admission.  Currently on Solu-Medrol as oral intake is poor due to alteration in mental status.  History of Alzheimer's dementia:  Continue home medication  Goals of care Given rapid improvement over the past 48 hours patient is no longer considered for hospice, essentially back to baseline mental status although ongoing hypoxia. Will reinitiate disposition plan to SNF given ongoing need for the physical therapy and ongoing hypoxia..  DVT prophylaxis: enoxaparin (LOVENOX) injection 40 mg Start: 10/28/20 1000  Code status: DNR Family Communication: No answer from [name] [name] - spoke with [name] [name] at length - now want SNF again Disposition: Family now back on board for SNF given rapid improvement Status is: Inpatient  Remains inpatient appropriate because: Altered mental status  Dispo:  Patient From: Home  Planned Disposition: Home Hospice  Expected discharge date: 11/11/2020  Medically stable for discharge: No  Consultants:   None  Procedures:   None  Antimicrobials:  Anti-infectives (From admission, onward)   Start     Dose/Rate Route Frequency Ordered Stop   11/09/20 1830  Ampicillin-Sulbactam (UNASYN) 3 g in sodium chloride 0.9 % 100 mL IVPB        3 g 200 mL/hr over 30 Minutes Intravenous Every 8 hours 11/09/20 1744     10/28/20 1000  remdesivir 100 mg in sodium chloride 0.9 % 100 mL IVPB       "  Followed by" Linked Group Details   100 mg 200 mL/hr over 30 Minutes Intravenous Daily 10/27/20 2244  10/31/20 1244   10/27/20 2245  remdesivir 200 mg in sodium chloride 0.9% 250 mL IVPB       "Followed by" Linked Group Details   200 mg 580 mL/hr over 30 Minutes Intravenous Once 10/27/20 2244 10/28/20 0107   10/27/20 2245  cefTRIAXone (ROCEPHIN) 2 g in sodium chloride 0.9 % 100 mL IVPB  Status:  Discontinued        2 g 200 mL/hr over 30 Minutes Intravenous Every 24 hours 10/27/20 2244 11/01/20 0931   10/27/20 2245  doxycycline (VIBRAMYCIN) 100 mg in sodium chloride 0.9 % 250 mL IVPB  Status:  Discontinued        100 mg 125 mL/hr over 120 Minutes Intravenous Every 12 hours 10/27/20 2244 11/01/20 0931          Objective: Vitals:   11/09/20 2200 11/10/20 0000 11/10/20 0200 11/10/20 0417  BP: 109/76 106/75 110/80 105/70  Pulse: 80 75 80 76  Resp: _0 Temp: 98 F (36.7 C) 98.1 F (36.7 C) 98 F (36.7 C) 98.1 F (36.7 C)  TempSrc: Oral Axillary Axillary Axillary  SpO2: 92% 92% 91% 90%  Weight:      Height:        Intake/Output Summary (Last 24 hours) at 11/10/2020 0711 Last data filed at 11/10/2020 0500 Gross per 24 hour  Intake 200 ml  Output --  Net 200 ml   Filed Weights   10/28/20 2122 11/08/20 0400  Weight: 83.1 kg 80.9 kg    Examination:  General appearance: Awake alert oriented to person only. Resp: Diminished air entry at the bases.  No wheezing or rhonchi.  No crackles appreciated. Cardio: S1-S2 is normal regular.  No S3-S4.  No rubs murmurs or bruit GI: Abdomen is soft.  Nontender nondistended.  Bowel sounds are present normal.  No masses organomegaly Extremities: No edema.  Noted to be moving all of her extremities Neurologic: Follows simple commands, alert to person only and no focal deficits  Data Reviewed: I have personally reviewed following labs and imaging studies  CBC: Recent Labs  Lab 11/05/20 0245 11/08/20 0207 11/09/20 0246  WBC 9.0 9.5 9.4  HGB 14.6 14.3 13.8  HCT 45.0 44.3 41.1  MCV 88.8 88.8 89.0  PLT 243 203 343   Basic  Metabolic Panel: Recent Labs  Lab 11/05/20 0245 11/08/20 0207 11/09/20 0640  NA 141 136 136  K 4.2 4.0 3.8  CL 104 102 101  CO2 _1 GLUCOSE 170* 142* 189*  BUN 25* 26* 30*  CREATININE 1.02* 1.01* 1.05*  CALCIUM 8.5* 8.2* 7.8*   GFR: Estimated Creatinine Clearance: 45.2 mL/min (A) (by C-G formula based on SCr of 1.05 mg/dL (H)).  CBG: Recent Labs  Lab 11/09/20 0640 11/09/20 0747 11/09/20 1218 11/09/20 1729 11/09/20 2102  GLUCAP 176* 185* 130* 127* 143*       Radiology Studies: CT HEAD WO CONTRAST  Result Date: 11/08/2020 CLINICAL DATA:  Mental status change with unknown cause. EXAM: CT HEAD WITHOUT CONTRAST TECHNIQUE: Contiguous axial images were obtained from the base of the skull through the vertex without intravenous contrast. COMPARISON:  10/27/2020 FINDINGS: Brain: The right pons appears low-density on a few slices, but limited by typical posterior fossa artifact with band of low-density seen on sagittal reformats. Cerebral atrophy and chronic small vessel ischemia. Volume loss is especially notable in the temporal  lobes. 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 Confluent chronic small vessel ischemia in the cerebral white matter. Vascular: Atheromatous calcification. Skull: No acute or aggressive finding. Sinuses/Orbits: Mild generalized mucosal thickening in the paranasal sinuses. Bilateral cataract resection. IMPRESSION: 1. Artifact versus infarct in the right pons, favor artifact. Please correlate for bulbar symptoms. 2. Brain atrophy and chronic small vessel ischemia. Electronically Signed   By: Monte Fantasia M.D.   On: 11/08/2020 10:49   CT ANGIO CHEST PE W OR WO CONTRAST  Result Date: 11/09/2020 CLINICAL DATA:  85 year old female with concern for pulmonary embolism. Positive COVID test. EXAM: CT ANGIOGRAPHY CHEST WITH CONTRAST TECHNIQUE: Multidetector CT imaging of the chest was performed using the standard protocol  during bolus administration of intravenous contrast. Multiplanar CT image reconstructions and MIPs were obtained to evaluate the vascular anatomy. CONTRAST:  Seventy-five mL Omnipaque 350, intravenous COMPARISON:  10/27/2020 FINDINGS: Cardiovascular: Satisfactory opacification of the pulmonary arteries to the segmental level. No evidence of pulmonary embolism. Normal heart size. No pericardial effusion. Atherosclerotic calcification of the normal sized thoracic aorta, most prominent in the arch. Mediastinum/Nodes: No enlarged mediastinal, hilar, or axillary lymph nodes. Thyroid gland, trachea, and esophagus demonstrate no significant findings. Lungs/Pleura: Peripherally predominant ground-glass, streaky opacities in the right upper, bilateral middle, and bilateral lower lobes. There is subsegmental dependent atelectasis and trace bilateral pleural effusions. No suspicious pulmonary nodules. No pneumothorax. Upper Abdomen: Small hiatal hernia. The remaining visualized upper abdomen is within normal limits. Musculoskeletal: Similar appearing anterior wedge compression deformity of the T4 vertebral body with associated sclerotic appearance and associated mild kyphosis at this level. Sigmoid curvature of the thoracic spine. No acute osseous abnormality. Review of the MIP images confirms the above findings. IMPRESSION: Vascular: No evidence of acute pulmonary embolism. Non-Vascular: Bilateral midlung and lower lobe predominant streaky ground-glass opacifications with bibasilar subsegmental atelectasis. In the setting of positive COVID 19 test, these findings most likely represent multifocal pneumonia, however asymmetric pulmonary edema could appear similarly. Ruthann Cancer, MD [facility] Electronically Signed   By: Ruthann Cancer MD   On: 11/09/2020 13:23   MR BRAIN WO CONTRAST  Result Date: 11/08/2020 CLINICAL DATA:  Acute neuro deficit rule out stroke. EXAM:  MRI HEAD WITHOUT CONTRAST TECHNIQUE: Multiplanar, multiecho pulse sequences of the brain and surrounding structures were obtained without intravenous contrast. COMPARISON:  CT head 11/08/2020.  MRI head 10/27/2020 FINDINGS: Brain: Negative for acute infarct Moderate to advanced atrophy most prominent in the frontal and temporal lobes. Ventricular enlargement consistent with atrophy. Moderate white matter changes most consistent with chronic ischemia. Negative for hemorrhage or mass. Vascular: Normal arterial flow voids. Skull and upper cervical spine: No focal skeletal lesion. Sinuses/Orbits: Mild mucosal edema paranasal sinuses. Bilateral cataract extraction Other: None IMPRESSION: Negative for acute infarct. Stable atrophy and chronic white matter changes. Electronically Signed   By: Franchot Gallo M.D.   On: 11/08/2020 12:44   DG CHEST PORT 1 VIEW  Result Date: 11/08/2020 CLINICAL DATA:  Acute hypoxic respiratory failure EXAM: PORTABLE CHEST 1 VIEW COMPARISON:  October 27, 2020 FINDINGS: Heart size is stable but enlarged. There are aortic calcifications. There are small bilateral pleural effusions. There are streaky bibasilar airspace opacities. There are prominent interstitial lung markings. There is no pneumothorax. IMPRESSION: 1. Prominent interstitial lung markings with small bilateral pleural effusions. Findings are suggestive of interstitial edema. However, a developing atypical infectious process is not excluded. 2. Stable cardiac silhouette with  atherosclerotic changes of the thoracic aorta. Electronically Signed   By: Constance Holster M.D.   On: 11/08/2020 23:11   EEG adult  Result Date: 11/08/2020 Lora Havens, MD     11/08/2020  5:40 PM Patient Name: [name] [name] MRN: [id number] Epilepsy Attending: Lora Havens Referring Physician/Provider: Dr Bonnielee Haff Date: 11/08/2020 Duration: 25.01 mins Patient history: 85yo F with ams. EEG to evaluate for seizure. Level of alertness: Awake,  asleep AEDs during EEG study: None Technical aspects: This EEG study was done with scalp electrodes positioned according to the 10-20 International system of electrode placement. Electrical activity was acquired at a sampling rate of 500Hz and reviewed with a high frequency filter of 70Hz and a low frequency filter of 1Hz. EEG data were recorded continuously and digitally stored. Description: The posterior dominant rhythm consists of 8-9 Hz activity of moderate voltage (25-35 uV) seen predominantly in posterior head regions, symmetric and reactive to eye opening and eye closing. Sleep was characterized by vertex waves, sleep spindles (12 to 14 Hz), maximal frontocentral region. EEG showed continuous generalized 5 to 7 Hz theta slowing. Triphasic waves, generalized were also noted. Hyperventilation and photic stimulation were not performed.   ABNORMALITY -Continuous slow, generalized -Triphasic waves, generalized IMPRESSION: This study is suggestive of mild diffuse encephalopathy, nonspecific etiology but likely related to toxic-metabolic etiology. No seizures or epileptiform discharges were seen throughout the recording. [name] [name]    Scheduled Meds: . enoxaparin (LOVENOX) injection  40 mg Subcutaneous Daily  . feeding supplement  237 mL Oral TID BM  . memantine  10 mg Oral BID  . methylPREDNISolone (SOLU-MEDROL) injection  40 mg Intravenous Q24H  . mirtazapine  7.5 mg Oral QHS  . pantoprazole  40 mg Oral Daily  . predniSONE  7.5 mg Oral Q breakfast  . pyridostigmine  60 mg Oral TID  . QUEtiapine  12.5 mg Oral QHS   Continuous Infusions: . sodium chloride 75 mL/hr at 11/09/20 1523  . ampicillin-sulbactam (UNASYN) IV 3 g (11/10/20 0151)     LOS: 14 days   Little Ishikawa, DO [facility]  11/10/2020, 7:11 AM   To contact the attending provider between 7A-7P or the covering provider during after hours 7P-7A, please log into the web site [email or web address].

## 2020-11-10 NOTE — Progress Notes (Signed)
Occupational Therapy Treatment Patient Details Name: [name] [name] MRN: [id number] DOB: 12-08-1935 Today's Date: 11/10/2020    History of present illness Pt is an 85 y.o. female admitted 10/27/20 with AMS; workup for acute metabolic encephalopathy, acute hypoxic respiratory failure secondary to COVID-19 PNA. Course complicated by AMS and hypoxia. EEG 1/10 suggestive of mild diffuse encephalpathy; no seizures. Repeat head CT/MRI 1/10 negative for acute injury. CTA negative for PE. PMH includes dementia, myasthenia gravis.   OT comments  Pt seated in recliner upon arrival. She remains pleasantly confused today and requiring max multimodal cues for command follow and for overall safety throughout session. Pt engaging in seated grooming ADL during session, requiring minA for task completion but requiring max cues to identify ADL item and to initiate using. Pt requiring up to modA for functional transfers to RW (sit<>stand only today). HR up to 130s with static standing, SpO2 maintaining >90% on 9L HFNC throughout. Continue to recommend SNF at time of discharge however noted family may prefer for pt to return. If family refuses SNF recommend max HH services and hands on assist at time of discharge. Acute OT to follow.    Follow Up Recommendations  SNF;Supervision/Assistance - 24 hour    Equipment Recommendations  Wheelchair (measurements OT);Wheelchair cushion (measurements OT);3 in 1 bedside commode          Precautions / Restrictions Precautions Precautions: Fall;Other (comment) Precaution Comments: Bowel/bladder incontinence Restrictions Weight Bearing Restrictions: No       Mobility Bed Mobility               General bed mobility comments: OOB in recliner upon arrival  Transfers Overall transfer level: Needs assistance Equipment used: Rolling walker (2 wheeled) Transfers: Sit to/from Stand Sit to Stand: Min assist;Mod assist         General transfer comment: completed  x2 from recliner, minA initially but requiring increased boosting assist on second stand. max multimodal cues to follow instruction and initiate performing task    Balance Overall balance assessment: Needs assistance Sitting-balance support: Bilateral upper extremity supported;Feet supported Sitting balance-Leahy Scale: Fair     Standing balance support: Bilateral upper extremity supported;During functional activity Standing balance-Leahy Scale: Poor Standing balance comment: Reliant on UE support and external assist; dependent for pericare                           ADL either performed or assessed with clinical judgement   ADL Overall ADL's : Needs assistance/impaired     Grooming: Brushing hair;Minimal assistance;Sitting Grooming Details (indicate cue type and reason): max cues to recognize that she was holding a comb and to initiate performing task                             Functional mobility during ADLs: Moderate assistance;Rolling walker (sit<>Stand only)                         Cognition Arousal/Alertness: Awake/alert Behavior During Therapy: WFL for tasks assessed/performed Overall Cognitive Status: History of cognitive impairments - at baseline Area of Impairment: Orientation;Attention;Memory;Following commands;Safety/judgement;Awareness;Problem solving                 Orientation Level: Disoriented to;Place;Time;Situation Current Attention Level: Sustained Memory: Decreased short-term memory Following Commands: Follows one step commands inconsistently;Follows one step commands with increased time Safety/Judgement: Decreased awareness of safety;Decreased awareness of deficits Awareness: Intellectual  Problem Solving: Difficulty sequencing;Requires verbal cues;Decreased initiation General Comments: Pt pleasantly confused throughout session; requires frequent redirection to task, frequent repetition, max cues for safety         Exercises     Shoulder Instructions       General Comments noted pt's IV leaking at start of session. RN notified and in to address start of session    Pertinent Vitals/ Pain       Pain Assessment: No/denies pain  Home Living                                          Prior Functioning/Environment              Frequency  Min 2X/week        Progress Toward Goals  OT Goals(current goals can now be found in the care plan section)  Progress towards OT goals: Progressing toward goals  Acute Rehab OT Goals Patient Stated Goal: none stated, agreeable to working with therapy OT Goal Formulation: With patient Time For Goal Achievement: 11/13/20 Potential to Achieve Goals: Good ADL Goals Pt Will Perform Grooming: with min assist;standing Pt Will Perform Lower Body Dressing: with min assist;sitting/lateral leans;sit to/from stand Pt Will Transfer to Toilet: with min assist;ambulating;regular height toilet;grab bars Additional ADL Goal #1: Pt will follow one step ADL commands with min VC's  Plan Discharge plan remains appropriate    Co-evaluation                 AM-PAC OT "6 Clicks" Daily Activity     Outcome Measure   Help from another person eating meals?: A Little Help from another person taking care of personal grooming?: A Little Help from another person toileting, which includes using toliet, bedpan, or urinal?: Total Help from another person bathing (including washing, rinsing, drying)?: A Lot Help from another person to put on and taking off regular upper body clothing?: A Little Help from another person to put on and taking off regular lower body clothing?: Total 6 Click Score: 13    End of Session Equipment Utilized During Treatment: Rolling walker;Gait belt;Oxygen  OT Visit Diagnosis: Unsteadiness on feet (R26.81);Other abnormalities of gait and mobility (R26.89);Muscle weakness (generalized) (M62.81);Other symptoms and signs  involving cognitive function   Activity Tolerance Patient tolerated treatment well   Patient Left     Nurse Communication          Time: 8563-1497 OT Time Calculation (min): 29 min  Charges: OT General Charges $OT Visit: 1 Visit OT Treatments $Self Care/Home Management : 23-37 mins  Marcy Siren, OT Acute Rehabilitation Services Pager [phone] Office [phone]    Orlando Penner 11/10/2020, 1:01 PM

## 2020-11-11 DIAGNOSIS — J9600 Acute respiratory failure, unspecified whether with hypoxia or hypercapnia: Secondary | ICD-10-CM | POA: Diagnosis not present

## 2020-11-11 DIAGNOSIS — U071 COVID-19: Secondary | ICD-10-CM | POA: Diagnosis not present

## 2020-11-11 LAB — COMPREHENSIVE METABOLIC PANEL
ALT: 40 U/L (ref 0–44)
AST: 33 U/L (ref 15–41)
Albumin: 2.1 g/dL — ABNORMAL LOW (ref 3.5–5.0)
Alkaline Phosphatase: 45 U/L (ref 38–126)
Anion gap: 11 (ref 5–15)
BUN: 36 mg/dL — ABNORMAL HIGH (ref 8–23)
CO2: 24 mmol/L (ref 22–32)
Calcium: 8 mg/dL — ABNORMAL LOW (ref 8.9–10.3)
Chloride: 106 mmol/L (ref 98–111)
Creatinine, Ser: 1.04 mg/dL — ABNORMAL HIGH (ref 0.44–1.00)
GFR, Estimated: 54 mL/min — ABNORMAL LOW (ref >60)
Glucose, Bld: 118 mg/dL — ABNORMAL HIGH (ref 70–99)
Potassium: 4 mmol/L (ref 3.5–5.1)
Sodium: 141 mmol/L (ref 135–145)
Total Bilirubin: 0.5 mg/dL (ref 0.3–1.2)
Total Protein: 5 g/dL — ABNORMAL LOW (ref 6.5–8.1)

## 2020-11-11 LAB — GLUCOSE, CAPILLARY
Glucose-Capillary: 96 mg/dL (ref 70–99)
Glucose-Capillary: 115 mg/dL — ABNORMAL HIGH (ref 70–99)
Glucose-Capillary: 204 mg/dL — ABNORMAL HIGH (ref 70–99)
Glucose-Capillary: 293 mg/dL — ABNORMAL HIGH (ref 70–99)

## 2020-11-11 LAB — CBC
HCT: 38.3 % (ref 36.0–46.0)
Hemoglobin: 12.2 g/dL (ref 12.0–15.0)
MCH: 28.5 pg (ref 26.0–34.0)
MCHC: 31.9 g/dL (ref 30.0–36.0)
MCV: 89.5 fL (ref 80.0–100.0)
Platelets: 157 10*3/uL (ref 150–400)
RBC: 4.28 MIL/uL (ref 3.87–5.11)
RDW: 13.1 % (ref 11.5–15.5)
WBC: 8.9 10*3/uL (ref 4.0–10.5)
nRBC: 0 % (ref 0.0–0.2)

## 2020-11-11 MED ORDER — ORAL CARE MOUTH RINSE
15.0000 mL | Freq: Two times a day (BID) | OROMUCOSAL | Status: DC
  Administered 2020-11-11: 15 mL via OROMUCOSAL

## 2020-11-11 NOTE — Plan of Care (Signed)
  Problem: Clinical Measurements: Goal: Diagnostic test results will improve Outcome: Progressing   Problem: Activity: Goal: Risk for activity intolerance will decrease Outcome: Progressing   Problem: Coping: Goal: Level of anxiety will decrease Outcome: Progressing   Problem: Elimination: Goal: Will not experience complications related to bowel motility Outcome: Progressing

## 2020-11-11 NOTE — Progress Notes (Signed)
Physical Therapy Treatment Patient Details Name: [name] [name] MRN: [id number] DOB: November 21, 1935 Today's Date: 11/11/2020    History of Present Illness Pt is an 85 y.o. female admitted 10/27/20 with AMS; workup for acute metabolic encephalopathy, acute hypoxic respiratory failure secondary to COVID-19 PNA. Course complicated by AMS and hypoxia. EEG 1/10 suggestive of mild diffuse encephalopathy; no seizures. Repeat head CT/MRI 1/10 negative for acute injury. CTA negative for PE. PMH includes dementia, myasthenia gravis.   PT Comments    Pt with fatigue/lethargy(?) this session and decreased participation. Pt unaware of incontinence and not interested in washup, requiring maxA+2 for transfers and dependent for ADL tasks at chair/bed-level. Pt opening eyes and carrying conversation at times, but keeping eyes closed majority of session. Continue to recommend SNF-level therapies to maximize functional mobility and independence, as well as decrease caregiver burden.   Follow Up Recommendations  SNF;Supervision/Assistance - 24 hour     Equipment Recommendations  Wheelchair (measurements PT);Wheelchair cushion (measurements PT);Hospital bed    Recommendations for Other Services       Precautions / Restrictions Precautions Precautions: Fall;Other (comment) Precaution Comments: Bowel/bladder incontinence Restrictions Weight Bearing Restrictions: No    Mobility  Bed Mobility Overal bed mobility: Needs Assistance Bed Mobility: Sit to Supine       Sit to supine: Mod assist;Max assist   General bed mobility comments: Pt received almost flat/scooted far down in recliner requiring maxA to lean forward and scoot hips back minimally, ultimately requiring maxA+2 to totalA with chuck pad to scoot back to prevent sliding off edge of seat. ModA to assist BLEs into bed; maxA to assist trunk with repositioning as pt laying head back on arm rail and did not care to correct  Transfers Overall transfer  level: Needs assistance   Transfers: Stand Pivot Transfers   Stand pivot transfers: Max assist;+2 physical assistance       General transfer comment: Pt not assisting with attempts to stand or partially stand in order to scoot hips back in recliner; maxA+2 to pivot from recliner to bed; pt unaware of bladder/bowel incontinence  Ambulation/Gait                 Stairs             Wheelchair Mobility    Modified Rankin (Stroke Patients Only)       Balance Overall balance assessment: Needs assistance Sitting-balance support: Bilateral upper extremity supported;Feet supported Sitting balance-Leahy Scale: Fair Sitting balance - Comments: Unable to maintain static sitting at edge of recliner, but able to maintain while seated EOB, close min guard   Standing balance support: Bilateral upper extremity supported;During functional activity Standing balance-Leahy Scale: Zero Standing balance comment: MaxA+2 to maintain standing                            Cognition Arousal/Alertness: Lethargic;Awake/alert Behavior During Therapy: Flat affect Overall Cognitive Status: History of cognitive impairments - at baseline                                 General Comments: Pt would open eyes and carry brief conversation, then closing eyes again; minimal command following, little to no participation in attempts at mobility; not aware of bowel/bladder incontinence      Exercises      General Comments General comments (skin integrity, edema, etc.): SpO2 >/94% on O2 [place]; NT present for +  2 assist; pt dependent for pericare/washup. Very different cognition/participation level compared to Tuesday's session; sounds like pt's mental status has been fluctuating since admission      Pertinent Vitals/Pain Pain Assessment: Faces Faces Pain Scale: No hurt Pain Intervention(s): Monitored during session    Home Living                      Prior Function             PT Goals (current goals can now be found in the care plan section) Progress towards PT goals: Not progressing toward goals - comment (limited by AMS/lethargy(?))    Frequency    Min 2X/week      PT Plan Frequency needs to be updated    Co-evaluation              AM-PAC PT "6 Clicks" Mobility   Outcome Measure  Help needed turning from your back to your side while in a flat bed without using bedrails?: A Lot Help needed moving from lying on your back to sitting on the side of a flat bed without using bedrails?: A Lot Help needed moving to and from a bed to a chair (including a wheelchair)?: A Lot Help needed standing up from a chair using your arms (e.g., wheelchair or bedside chair)?: A Lot Help needed to walk in hospital room?: Total Help needed climbing 3-5 steps with a railing? : Total 6 Click Score: 10    End of Session Equipment Utilized During Treatment: Gait belt;Oxygen Activity Tolerance: Patient limited by fatigue;Patient limited by lethargy Patient left: in bed;with call bell/phone within reach;with nursing/sitter in room Nurse Communication: Mobility status PT Visit Diagnosis: Unsteadiness on feet (R26.81);Other abnormalities of gait and mobility (R26.89);Muscle weakness (generalized) (M62.81);Difficulty in walking, not elsewhere classified (R26.2)     Time: 1211-1232 PT Time Calculation (min) (ACUTE ONLY): 21 min  Charges:  $Therapeutic Activity: 8-22 mins                     Ina Homes, PT, DPT Acute Rehabilitation Services  Pager [phone] Office [phone]  Malachy Chamber 11/11/2020, 1:56 PM

## 2020-11-11 NOTE — Progress Notes (Signed)
PROGRESS NOTE    [name] [name]  UTM:[id number] DOB: January 30, 1936 DOA: 10/27/2020 PCP: Tonia Ghent, MD  Brief Narrative:  [name] [name] is a 85 y.o. female with known history of dementia and ocular myasthenia gravis was found to be increasingly confused by patient's daughter and was brought to the ER. Per the report patient usually is ambulatory and able to dress herself. Patient is lethargic, and confused on presentation, work-up for encephalopathy is negative, her work-up was significant for Covid pneumonia, and metabolic encephalopathy in the setting of Covid severe hospital delirium with underlining advanced dementia.  Subjective: No acute issues or events overnight, much more awake and alert this morning, review of systems somewhat limited given patient's confusion and poor conversation/somewhat rambling speech.  Assessment & Plan:  Acute respiratory failure with hypoxia in the setting of acute aspiration pneumonia/pneumonitis with recent COVID-19 PNA - Recent likely acute aspiration event 11/08/20 - unasyn to cover for 5 days - Speech cleared patient for regular diet thin liquids - Patient presented with COVID-19 infection and pneumonia.  - Completed remdesivir  - Continue to wean steroids - on 7.74m prednisone at baseline.  - CTA negative for PE - Continue to wean - currently 4L [place]  Pneumonia due to COVID-19 - Patient is unvaccinated against COVID-19. Patient met sepsis criteria based on the source of bacterial infection/pneumonia with tachycardia and tachypnea.  - She however had minimal oxygen requirements. Patient was treated with ceftriaxone and doxycycline for presumed concurrent bacterial pneumonia. - Also treated with Remdesivir and steroids. Did not receive Actemra or baricitinib given bacterial infection - Continue steroid taper - Can come off of isolation for COVID-19 on January 18, 25681 Acute metabolic encephalopathy on baseline dementia:  - Patient with underlying  Alzheimer's dementia. - Patient noted to have significant delirium during this hospital stay.  - Delirium had improved.   - Baseline alert to person and distant/remote events only. Has trouble with current affairs and even recognizing her own children/family. - Previously done MRI, CT angiogram of head and neck were all are negative for any acute pathology.  Hypoglycemia - No further episodes of hypoglycemia noted.  Continue to monitor CBGs.    History of myasthenia gravis - Stable. Pyridostigmine was resumed.  Patient apparently also on prednisone 7.575mdaily prior to admission.  Currently on Solu-Medrol as oral intake is poor due to alteration in mental status.  History of Alzheimer's dementia:  - Continue home medication  Goals of care - Given rapid improvement over the past 48 hours patient is no longer considered for hospice, essentially back to baseline mental status although ongoing hypoxia. - Will reinitiate disposition plan to SNF given ongoing need for the physical therapy and ongoing hypoxia.  DVT prophylaxis: enoxaparin (LOVENOX) injection 40 mg Start: 10/28/20 1000  Code status: DNR Family Communication: [facility] SNF again Disposition: Family now back on board for SNF given rapid improvement Status is: Inpatient  Remains inpatient appropriate because: Altered mental status  Dispo:  Patient From: Home  Planned Disposition: SNF  Expected discharge date: 11/12/20  Medically stable for discharge: No  Consultants:   None  Procedures:   None  Antimicrobials:  Anti-infectives (From admission, onward)   Start     Dose/Rate Route Frequency Ordered Stop   11/09/20 1830  Ampicillin-Sulbactam (UNASYN) 3 g in sodium chloride 0.9 % 100 mL IVPB        3 g 200 mL/hr over 30 Minutes Intravenous Every 8 hours 11/09/20 1744  10/28/20 1000  remdesivir 100 mg in sodium chloride 0.9 % 100 mL IVPB       "Followed by" Linked Group Details   100 mg 200 mL/hr over 30 Minutes  Intravenous Daily 10/27/20 2244 10/31/20 1244   10/27/20 2245  remdesivir 200 mg in sodium chloride 0.9% 250 mL IVPB       "Followed by" Linked Group Details   200 mg 580 mL/hr over 30 Minutes Intravenous Once 10/27/20 2244 10/28/20 0107   10/27/20 2245  cefTRIAXone (ROCEPHIN) 2 g in sodium chloride 0.9 % 100 mL IVPB  Status:  Discontinued        2 g 200 mL/hr over 30 Minutes Intravenous Every 24 hours 10/27/20 2244 11/01/20 0931   10/27/20 2245  doxycycline (VIBRAMYCIN) 100 mg in sodium chloride 0.9 % 250 mL IVPB  Status:  Discontinued        100 mg 125 mL/hr over 120 Minutes Intravenous Every 12 hours 10/27/20 2244 11/01/20 0931          Objective: Vitals:   11/10/20 0417 11/10/20 1405 11/10/20 2118 11/11/20 0411  BP: 105/70 111/64 (!) 103/54 123/68  Pulse: 76 (!) 104 86 76  Resp: _0 Temp: 98.1 F (36.7 C) 99.7 F (37.6 C) 98.2 F (36.8 C) 98 F (36.7 C)  TempSrc: Axillary Axillary Axillary Axillary  SpO2: 90% 91% 90% 96%  Weight:      Height:       No intake or output data in the 24 hours ending 11/11/20 0810 Filed Weights   10/28/20 2122 11/08/20 0400  Weight: 83.1 kg 80.9 kg    Examination:  General appearance: Awake alert oriented to person only. Resp: Diminished air entry at the bases.  No wheezing or rhonchi.  No crackles appreciated. Cardio: S1-S2 is normal regular.  No S3-S4.  No rubs murmurs or bruit GI: Abdomen is soft.  Nontender nondistended.  Bowel sounds are present normal.  No masses organomegaly Extremities: No edema.  Noted to be moving all of her extremities Neurologic: Follows simple commands, alert to person only and no focal deficits  Data Reviewed: I have personally reviewed following labs and imaging studies  CBC: Recent Labs  Lab 11/05/20 0245 11/08/20 0207 11/09/20 0246 11/10/20 0848 11/11/20 0316  WBC 9.0 9.5 9.4 11.1* 8.9  HGB 14.6 14.3 13.8 12.9 12.2  HCT 45.0 44.3 41.1 40.4 38.3  MCV 88.8 88.8 89.0 89.0 89.5  PLT  243 203 175 196 914   Basic Metabolic Panel: Recent Labs  Lab 11/05/20 0245 11/08/20 0207 11/09/20 0640 11/10/20 0848 11/11/20 0316  NA 141 136 136 140 141  K 4.2 4.0 3.8 3.8 4.0  CL 104 102 101 103 106  CO2 _1 GLUCOSE 170* 142* 189* 122* 118*  BUN 25* 26* 30* 32* 36*  CREATININE 1.02* 1.01* 1.05* 1.08* 1.04*  CALCIUM 8.5* 8.2* 7.8* 8.1* 8.0*   GFR: Estimated Creatinine Clearance: 45.6 mL/min (A) (by C-G formula based on SCr of 1.04 mg/dL (H)).  CBG: Recent Labs  Lab 11/09/20 1729 11/09/20 2102 11/10/20 0758 11/10/20 1211 11/11/20 0713  GLUCAP 127* 143* 115* 129* 96    Radiology Studies: CT ANGIO CHEST PE W OR WO CONTRAST  Result Date: 11/09/2020 CLINICAL DATA:  85 year old female with concern for pulmonary embolism. Positive COVID test. EXAM: CT ANGIOGRAPHY CHEST WITH CONTRAST TECHNIQUE: Multidetector CT imaging of the chest was performed using the standard protocol during bolus administration of intravenous contrast. Multiplanar  CT image reconstructions and MIPs were obtained to evaluate the vascular anatomy. CONTRAST:  Seventy-five mL Omnipaque 350, intravenous COMPARISON:  10/27/2020 FINDINGS: Cardiovascular: Satisfactory opacification of the pulmonary arteries to the segmental level. No evidence of pulmonary embolism. Normal heart size. No pericardial effusion. Atherosclerotic calcification of the normal sized thoracic aorta, most prominent in the arch. Mediastinum/Nodes: No enlarged mediastinal, hilar, or axillary lymph nodes. Thyroid gland, trachea, and esophagus demonstrate no significant findings. Lungs/Pleura: Peripherally predominant ground-glass, streaky opacities in the right upper, bilateral middle, and bilateral lower lobes. There is subsegmental dependent atelectasis and trace bilateral pleural effusions. No suspicious pulmonary nodules. No pneumothorax. Upper Abdomen: Small hiatal hernia. The remaining visualized upper abdomen is within normal limits.  Musculoskeletal: Similar appearing anterior wedge compression deformity of the T4 vertebral body with associated sclerotic appearance and associated mild kyphosis at this level. Sigmoid curvature of the thoracic spine. No acute osseous abnormality. Review of the MIP images confirms the above findings. IMPRESSION: Vascular: No evidence of acute pulmonary embolism. Non-Vascular: Bilateral midlung and lower lobe predominant streaky ground-glass opacifications with bibasilar subsegmental atelectasis. In the setting of positive COVID 19 test, these findings most likely represent multifocal pneumonia, however asymmetric pulmonary edema could appear similarly. Ruthann Cancer, MD [facility] Electronically Signed   By: Ruthann Cancer MD   On: 11/09/2020 13:23   Scheduled Meds: . enoxaparin (LOVENOX) injection  40 mg Subcutaneous Daily  . feeding supplement  237 mL Oral TID BM  . memantine  10 mg Oral BID  . methylPREDNISolone (SOLU-MEDROL) injection  40 mg Intravenous Q24H  . mirtazapine  7.5 mg Oral QHS  . pantoprazole  40 mg Oral Daily  . predniSONE  7.5 mg Oral Q breakfast  . pyridostigmine  60 mg Oral TID  . QUEtiapine  12.5 mg Oral QHS   Continuous Infusions: . ampicillin-sulbactam (UNASYN) IV 3 g (11/11/20 0219)    LOS: 15 days   Little Ishikawa, DO [facility]  11/11/2020, 8:10 AM   To contact the attending provider between 7A-7P or the covering provider during after hours 7P-7A, please log into the web site [email or web address].

## 2020-11-11 NOTE — TOC Progression Note (Addendum)
Transition of Care [facility]) - Progression Note    Patient Details  Name: [name] [name] MRN: [id number] Date of Birth: 1936/07/19  [facility] CM/SW Contact  Mearl Latin, LCSW Phone Number: 11/11/2020, 8:49 AM  Clinical Narrative:    8:40am-CSW notes family's request for SNF again. No COVID beds available at this time; CSW expanded search since 14 days past first positive covid test.  12pm-CSW contacted patient's daughters and made them aware of bed offer from [facility]. They stated their grandma had been there and had a horrible experience and they do not want patient to go there.   3:30pm-CSW awaiting last responses from [facility] and [facility] on bed availability. Daughter, Synetta Fail, is confirming with her sister that either of those options would be fine. CSW submitted for insurance authorization and will update facility choice when confirmed.       Expected Discharge Plan: Skilled Nursing Facility Barriers to Discharge: Insurance Authorization,SNF Pending bed offer  Expected Discharge Plan and Services Expected Discharge Plan: Skilled Nursing Facility In-house Referral: Clinical Social Work Discharge Planning Services: CM Consult Post Acute Care Choice: Skilled Nursing Facility Living arrangements for the past 2 months: Single Family Home                                       Social Determinants of Health (SDOH) Interventions    Readmission Risk Interventions No flowsheet data found.

## 2020-11-11 NOTE — Evaluation (Signed)
Clinical/Bedside Swallow Evaluation Patient Details  Name: [name] [name] MRN: [id number] Date of Birth: 1936/05/30  Today's Date: 11/11/2020 Time: SLP Start Time (ACUTE ONLY): 1117 SLP Stop Time (ACUTE ONLY): 1127 SLP Time Calculation (min) (ACUTE ONLY): 10 min  Past Medical History:  Past Medical History:  Diagnosis Date  . Achilles tendonitis 10/28/2012  . Alzheimer's type dementia (HCC) 06/10/2013  . Anxiety   . Depression   . Fibromyalgia   . GERD (gastroesophageal reflux disease)   . Memory loss   . Myasthenia gravis   . Nocardia infection    h/o brain abscess  . Osteoporosis    Past Surgical History:  Past Surgical History:  Procedure Laterality Date  . ABDOMINAL HYSTERECTOMY    . APPENDECTOMY    . BLADDER REPAIR    . BREAST BIOPSY    . CHOLECYSTECTOMY    . TONSILLECTOMY AND ADENOIDECTOMY     HPI:  85 y.o. female admitted 10/27/20 with AMS; workup for acute metabolic encephalopathy, acute hypoxic respiratory failure secondary to COVID-19 PNA. Course complicated by AMS and hypoxia. EEG 1/10 suggestive of mild diffuse encephalpathy; no seizures. Repeat head CT/MRI 1/10 negative for acute injury. CTA negative for PE. PMH includes dementia, myasthenia gravis. SLP consulted for swallow assessment on 12/30; results indicated normal swallow function.  Rapid response event on 1/10 with poor responsiveness and  desaturations.  There were concerns for a possible aspiration event per chart review. Pt required higher levels of 02, has started to wean and her MS has improved.   Assessment / Plan / Recommendation Clinical Impression  Pt presents with normal oropharyngeal swallow with no signs of dysphagia nor aspiration.  She was quite confused, but fed herself crackers, applesauce, and 5 oz of water with no concerns.  Coordination of swallowing /respiration was WNL.  Recommend resuming prior diet.  No SLP f/u is needed. Communicated with pt's RN via Secure Chat.  Our service will sign  off. SLP Visit Diagnosis: Dysphagia, unspecified (R13.10)    Aspiration Risk  No limitations    Diet Recommendation   regular solids, thin liquids  Medication Administration: Whole meds with liquid    Other  Recommendations Oral Care Recommendations: Oral care BID   Follow up Recommendations None        Swallow Study   General HPI: 85 y.o. female admitted 10/27/20 with AMS; workup for acute metabolic encephalopathy, acute hypoxic respiratory failure secondary to COVID-19 PNA. Course complicated by AMS and hypoxia. EEG 1/10 suggestive of mild diffuse encephalpathy; no seizures. Repeat head CT/MRI 1/10 negative for acute injury. CTA negative for PE. PMH includes dementia, myasthenia gravis. SLP consulted for swallow assessment on 12/30; results indicated normal swallow function.  Rapid response event on 1/10 with poor responsiveness and  desaturations.  There were concerns for a possible aspiration event per chart review. Pt required higher levels of 02, has started to wean and her MS has improved. Type of Study: Bedside Swallow Evaluation Previous Swallow Assessment: see HPI Diet Prior to this Study: NPO Temperature Spikes Noted: No Respiratory Status: Nasal cannula History of Recent Intubation: No Behavior/Cognition: Alert;Cooperative;Pleasant mood;Confused Oral Cavity Assessment: Within Functional Limits Oral Care Completed by SLP: No Oral Cavity - Dentition: Adequate natural dentition Vision: Functional for self-feeding Self-Feeding Abilities: Able to feed self Patient Positioning: Upright in chair Baseline Vocal Quality: Normal Volitional Cough: Strong Volitional Swallow: Able to elicit    Oral/Motor/Sensory Function Overall Oral Motor/Sensory Function: Within functional limits   Ice Chips Ice chips: Within functional  limits   Thin Liquid Thin Liquid: Within functional limits    Nectar Thick Nectar Thick Liquid: Not tested   Honey Thick Honey Thick Liquid: Not tested    Puree Puree: Within functional limits   Solid     Solid: Within functional limits      Blenda Mounts Laurice 11/11/2020,11:41 AM Marchelle Folks L. Samson Frederic, MA CCC/SLP Acute Rehabilitation Services Office number [phone] Pager [phone]

## 2020-11-11 NOTE — Consult Note (Signed)
   [facility] CM Inpatient Consult   11/11/2020  [name] October 18, 1936 [id number]   [facility] [ACO] Patient: [name] [name] Medicare PPO   Patient screened for high score for unplanned readmission risk and with long length of stay for potential [place] [facility] post hospital follow up needs.  Review of patient's medical record [TOC team, PT, OT] reveals patient is being recommended for a skilled nursing facility level of care for transition.   Primary Care Provider is listed as Tonia Ghent, MD  this provider is listed to provide the transition of care [TOC] for post hospital follow up.  Plan: Continue to follow progress and disposition to assess for post hospital care management needs.  If patient transition to a skilled facility her transition of care needs are to be met at that level of care post hospital.  Please place a [place] [facility] consult as appropriate and for questions contact:   Natividad Brood, RN BSN [facility] Liaison  [phone] business mobile phone Toll free office [phone]  Fax number: [phone] [email or web address]

## 2020-11-12 DIAGNOSIS — M2550 Pain in unspecified joint: Secondary | ICD-10-CM | POA: Diagnosis not present

## 2020-11-12 DIAGNOSIS — F339 Major depressive disorder, recurrent, unspecified: Secondary | ICD-10-CM | POA: Diagnosis not present

## 2020-11-12 DIAGNOSIS — G9341 Metabolic encephalopathy: Secondary | ICD-10-CM | POA: Diagnosis not present

## 2020-11-12 DIAGNOSIS — R0902 Hypoxemia: Secondary | ICD-10-CM | POA: Diagnosis not present

## 2020-11-12 DIAGNOSIS — F0390 Unspecified dementia without behavioral disturbance: Secondary | ICD-10-CM | POA: Diagnosis not present

## 2020-11-12 DIAGNOSIS — J9600 Acute respiratory failure, unspecified whether with hypoxia or hypercapnia: Secondary | ICD-10-CM | POA: Diagnosis not present

## 2020-11-12 DIAGNOSIS — E559 Vitamin D deficiency, unspecified: Secondary | ICD-10-CM | POA: Diagnosis not present

## 2020-11-12 DIAGNOSIS — U071 COVID-19: Secondary | ICD-10-CM | POA: Diagnosis not present

## 2020-11-12 DIAGNOSIS — G7000 Myasthenia gravis without (acute) exacerbation: Secondary | ICD-10-CM | POA: Diagnosis not present

## 2020-11-12 DIAGNOSIS — Z7401 Bed confinement status: Secondary | ICD-10-CM | POA: Diagnosis not present

## 2020-11-12 DIAGNOSIS — F419 Anxiety disorder, unspecified: Secondary | ICD-10-CM | POA: Diagnosis not present

## 2020-11-12 DIAGNOSIS — J9601 Acute respiratory failure with hypoxia: Secondary | ICD-10-CM | POA: Diagnosis not present

## 2020-11-12 DIAGNOSIS — G9340 Encephalopathy, unspecified: Secondary | ICD-10-CM | POA: Diagnosis not present

## 2020-11-12 DIAGNOSIS — Z79899 Other long term (current) drug therapy: Secondary | ICD-10-CM | POA: Diagnosis not present

## 2020-11-12 DIAGNOSIS — D649 Anemia, unspecified: Secondary | ICD-10-CM | POA: Diagnosis not present

## 2020-11-12 DIAGNOSIS — R404 Transient alteration of awareness: Secondary | ICD-10-CM | POA: Diagnosis not present

## 2020-11-12 DIAGNOSIS — M797 Fibromyalgia: Secondary | ICD-10-CM | POA: Diagnosis not present

## 2020-11-12 DIAGNOSIS — E538 Deficiency of other specified B group vitamins: Secondary | ICD-10-CM | POA: Diagnosis not present

## 2020-11-12 DIAGNOSIS — G309 Alzheimer's disease, unspecified: Secondary | ICD-10-CM | POA: Diagnosis not present

## 2020-11-12 LAB — COMPREHENSIVE METABOLIC PANEL
ALT: 41 U/L (ref 0–44)
AST: 31 U/L (ref 15–41)
Albumin: 2.2 g/dL — ABNORMAL LOW (ref 3.5–5.0)
Alkaline Phosphatase: 50 U/L (ref 38–126)
Anion gap: 10 (ref 5–15)
BUN: 33 mg/dL — ABNORMAL HIGH (ref 8–23)
CO2: 24 mmol/L (ref 22–32)
Calcium: 8 mg/dL — ABNORMAL LOW (ref 8.9–10.3)
Chloride: 106 mmol/L (ref 98–111)
Creatinine, Ser: 0.89 mg/dL (ref 0.44–1.00)
GFR, Estimated: >60 mL/min (ref >60)
Glucose, Bld: 133 mg/dL — ABNORMAL HIGH (ref 70–99)
Potassium: 4 mmol/L (ref 3.5–5.1)
Sodium: 140 mmol/L (ref 135–145)
Total Bilirubin: 0.6 mg/dL (ref 0.3–1.2)
Total Protein: 5.5 g/dL — ABNORMAL LOW (ref 6.5–8.1)

## 2020-11-12 LAB — CBC
HCT: 36.9 % (ref 36.0–46.0)
Hemoglobin: 12.8 g/dL (ref 12.0–15.0)
MCH: 30.3 pg (ref 26.0–34.0)
MCHC: 34.7 g/dL (ref 30.0–36.0)
MCV: 87.2 fL (ref 80.0–100.0)
Platelets: 161 10*3/uL (ref 150–400)
RBC: 4.23 MIL/uL (ref 3.87–5.11)
RDW: 12.9 % (ref 11.5–15.5)
WBC: 10 10*3/uL (ref 4.0–10.5)
nRBC: 0 % (ref 0.0–0.2)

## 2020-11-12 LAB — GLUCOSE, CAPILLARY
Glucose-Capillary: 110 mg/dL — ABNORMAL HIGH (ref 70–99)
Glucose-Capillary: 184 mg/dL — ABNORMAL HIGH (ref 70–99)

## 2020-11-12 MED ORDER — AMOXICILLIN-POT CLAVULANATE 875-125 MG PO TABS: 1.0000 | ORAL_TABLET | Freq: Two times a day (BID) | ORAL | 0 refills | Status: AC

## 2020-11-12 NOTE — Discharge Summary (Signed)
Physician Discharge Summary  [name] BryantHilda A [name] NUU:[id number] DOB: 1935/12/12 DOA: 10/27/2020  PCP: Joaquim Namuncan, Graham S, MD  Admit date: 10/27/2020 Discharge date: 11/12/2020  Disposition: SNF  Recommendations for Outpatient Follow-up:  1. Follow up with PCP in 1-2 weeks 2. Please obtain BMP/CBC in one week  Discharge Condition: Stable CODE STATUS: DNR Diet recommendation: Regular diet, thin liquid  Brief/Interim Summary: [name] A Cobleis a 85 y.o.femalewithknown history of dementia and ocular myasthenia gravis was found to be increasingly confused by patient's daughter and was brought to the ER.Per the report patient usually is ambulatory and able to dress herself. Patient is lethargic, and confused on presentation, work-up for encephalopathy is negative, her work-up was significant for Covid pneumonia, and metabolic encephalopathy in the setting of Covid severe hospital delirium with underlining advanced dementia.  Patient admitted as above with acute respiratory failure and hypoxia in the setting of COVID-19 pneumonia which improved drastically over her initial hospital stay, patient was subsequently on room air and then had episode of aspiration pneumonia with concurrent pneumonitis.  For 24 to 48 hours post aspiration patient status was tenuous and family had discussed and was likely leaning towards hospice and bring the patient home, however, thankfully the patient continued to improve drastically and is now back to baseline mentally, oxygen continues to be weaned down and patient and family are otherwise agreeable to previous plan of discharge to skilled nursing facility for ongoing PT evaluation and treatment and oxygen weaning in the setting of pneumonia.  Patient will need close follow-up with PCP as scheduled, will discharge with two additional days of antibiotics to cover aspiration pneumonia otherwise to continue daily prednisone at previous dosage.  Discharge Diagnoses:  Principal  Problem:   Acute respiratory failure due to COVID-19 [facility](HCC) Active Problems:   Alzheimer's type dementia (HCC)   Myasthenia gravis (HCC)   Acute encephalopathy    Discharge Instructions  Discharge Instructions    Call MD for:  difficulty breathing, headache or visual disturbances   Complete by: As directed    Call MD for:  extreme fatigue   Complete by: As directed    Call MD for:  persistant dizziness or light-headedness   Complete by: As directed    Call MD for:  persistant nausea and vomiting   Complete by: As directed    Call MD for:  severe uncontrolled pain   Complete by: As directed    Call MD for:  temperature >100.4   Complete by: As directed    Diet - low sodium heart healthy   Complete by: As directed    Discharge instructions   Complete by: As directed    Patient to go home with hospice services.  You were cared for by a hospitalist during your hospital stay. If you have any questions about your discharge medications or the care you received while you were in the hospital after you are discharged, you can call the unit and asked to speak with the hospitalist on call if the hospitalist that took care of you is not available. Once you are discharged, your primary care physician will handle any further medical issues. Please note that NO REFILLS for any discharge medications will be authorized once you are discharged, as it is imperative that you return to your primary care physician (or establish a relationship with a primary care physician if you do not have one) for your aftercare needs so that they can reassess your need for medications and monitor your lab values. If  you do not have a primary care physician, you can call [phone] for a physician referral.   Increase activity slowly   Complete by: As directed    Increase activity slowly   Complete by: As directed      Allergies as of 11/12/2020      Reactions   Alendronate Sodium Other (See Comments)   Upset stomach    Donepezil Hcl Other (See Comments)   Intolerant   Vesicare [solifenacin] Other (See Comments)   dysuria   Sulfa Antibiotics Rash   Sulfasalazine Hives, Rash   Tetanus Toxoids Rash      Medication List    STOP taking these medications   acetaminophen 500 MG tablet Commonly known as: TYLENOL   hydrOXYzine 10 MG tablet Commonly known as: ATARAX/VISTARIL     TAKE these medications   amoxicillin-clavulanate 875-125 MG tablet Commonly known as: Augmentin Take 1 tablet by mouth 2 (two) times daily for 2 days.   CALCIUM 500 PO Take 1 tablet by mouth daily.   cholecalciferol 1000 units tablet Commonly known as: VITAMIN D Take 1,000 Units by mouth daily.   cyanocobalamin 1000 MCG tablet Take 1,000 mcg by mouth daily.   memantine 10 MG tablet Commonly known as: NAMENDA TAKE 1 TABLET BY MOUTH 2 TIMES DAILY.   mirtazapine 7.5 MG tablet Commonly known as: REMERON TAKE 1 TABLET BY MOUTH AT BEDTIME.   omeprazole 20 MG capsule Commonly known as: PRILOSEC Take 1 capsule (20 mg total) by mouth daily.   predniSONE 5 MG tablet Commonly known as: DELTASONE Take 1.5 tablets (7.5 mg total) by mouth daily with breakfast.   pyridostigmine 60 MG tablet Commonly known as: MESTINON Take 1 tablet (60 mg total) by mouth 3 (three) times daily.   QUEtiapine 25 MG tablet Commonly known as: SEROQUEL TAKE 1/2 TABLET BY MOUTH AT BEDTIME.       Contact information for follow-up providers    Joaquim Nam, MD. Schedule an appointment as soon as possible for a visit in 1 week(s).   Specialty: Family Medicine Contact information: [address] [place] [address] [phone]            Contact information for after-discharge care    Destination    [facility] .   Service: Skilled Nursing Contact information: [address] [place] [address] [phone]                 Allergies  Allergen Reactions  . Alendronate  Sodium Other (See Comments)    Upset stomach  . Donepezil Hcl Other (See Comments)    Intolerant  . Vesicare [Solifenacin] Other (See Comments)    dysuria  . Sulfa Antibiotics Rash  . Sulfasalazine Hives and Rash  . Tetanus Toxoids Rash    Consultations:  None   Procedures/Studies: CT Angio Head W or Wo Contrast  Result Date: 10/27/2020 CLINICAL DATA:  Unsteady gait with slurred speech EXAM: CT ANGIOGRAPHY HEAD AND NECK TECHNIQUE: Multidetector CT imaging of the head and neck was performed using the standard protocol during bolus administration of intravenous contrast. Multiplanar CT image reconstructions and MIPs were obtained to evaluate the vascular anatomy. Carotid stenosis measurements (when applicable) are obtained utilizing NASCET criteria, using the distal internal carotid diameter as the denominator. CONTRAST:  75mL OMNIPAQUE IOHEXOL 350 MG/ML SOLN COMPARISON:  None. FINDINGS: CTA NECK FINDINGS SKELETON: There is no bony spinal canal stenosis. No lytic or blastic lesion. OTHER NECK: Normal pharynx, larynx and  major salivary glands. No cervical lymphadenopathy. Unremarkable thyroid gland. UPPER CHEST: Areas of consolidation in the right upper lobe AORTIC ARCH: There is calcific atherosclerosis of the aortic arch. There is no aneurysm, dissection or hemodynamically significant stenosis of the visualized portion of the aorta. Conventional 3 vessel aortic branching pattern. The visualized proximal subclavian arteries are widely patent. RIGHT CAROTID SYSTEM: Normal without aneurysm, dissection or stenosis. LEFT CAROTID SYSTEM: Normal without aneurysm, dissection or stenosis. VERTEBRAL ARTERIES: Codominant configuration. Both origins are clearly patent. There is no dissection, occlusion or flow-limiting stenosis to the skull base (V1-V3 segments). CTA HEAD FINDINGS POSTERIOR CIRCULATION: --Vertebral arteries: Normal V4 segments. --Inferior cerebellar arteries: Normal. --Basilar artery: Normal.  --Superior cerebellar arteries: Normal. --Posterior cerebral arteries (PCA): Normal. ANTERIOR CIRCULATION: --Intracranial internal carotid arteries: Normal. --Anterior cerebral arteries (ACA): Normal. Both A1 segments are present. Patent anterior communicating artery (a-comm). --Middle cerebral arteries (MCA): Normal. VENOUS SINUSES: As permitted by contrast timing, patent. ANATOMIC VARIANTS: None Review of the MIP images confirms the above findings. IMPRESSION: 1. No emergent large vessel occlusion or high-grade stenosis of the intracranial arteries. 2. Areas of consolidation in the right upper lobe, concerning for pneumonia. Aortic Atherosclerosis (ICD10-I70.0). Electronically Signed   By: Deatra Robinson M.D.   On: 10/27/2020 21:31   CT HEAD WO CONTRAST  Result Date: 11/08/2020 CLINICAL DATA:  Mental status change with unknown cause. EXAM: CT HEAD WITHOUT CONTRAST TECHNIQUE: Contiguous axial images were obtained from the base of the skull through the vertex without intravenous contrast. COMPARISON:  10/27/2020 FINDINGS: Brain: The right pons appears low-density on a few slices, but limited by typical posterior fossa artifact with band of low-density seen on sagittal reformats. Cerebral atrophy and chronic small vessel ischemia. Volume loss is especially notable in the temporal lobes. 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 Confluent chronic small vessel ischemia in the cerebral white matter. Vascular: Atheromatous calcification. Skull: No acute or aggressive finding. Sinuses/Orbits: Mild generalized mucosal thickening in the paranasal sinuses. Bilateral cataract resection. IMPRESSION: 1. Artifact versus infarct in the right pons, favor artifact. Please correlate for bulbar symptoms. 2. Brain atrophy and chronic small vessel ischemia. Electronically Signed   By: Marnee Spring M.D.   On: 11/08/2020 10:49   CT HEAD WO CONTRAST  Result Date: 10/27/2020 CLINICAL  DATA:  Slurred speech, unsteady gait EXAM: CT HEAD WITHOUT CONTRAST TECHNIQUE: Contiguous axial images were obtained from the base of the skull through the vertex without intravenous contrast. COMPARISON:  05/14/2010 FINDINGS: Brain: No evidence of acute infarction, hemorrhage, extra-axial collection, ventriculomegaly, or mass effect. Generalized cerebral atrophy. Periventricular white matter low attenuation likely secondary to microangiopathy. Vascular: Cerebrovascular atherosclerotic calcifications are noted. Skull: Negative for fracture or focal lesion. Sinuses/Orbits: Visualized portions of the orbits are unremarkable. Visualized portions of the paranasal sinuses are unremarkable. Visualized portions of the mastoid air cells are unremarkable. Other: None. IMPRESSION: 1. No acute intracranial pathology. 2. Chronic microvascular disease and cerebral atrophy. Electronically Signed   By: Elige Ko   On: 10/27/2020 14:26   CT Angio Neck W and/or Wo Contrast  Result Date: 10/27/2020 CLINICAL DATA:  Unsteady gait with slurred speech EXAM: CT ANGIOGRAPHY HEAD AND NECK TECHNIQUE: Multidetector CT imaging of the head and neck was performed using the standard protocol during bolus administration of intravenous contrast. Multiplanar CT image reconstructions and MIPs were obtained to evaluate the vascular anatomy. Carotid stenosis measurements (when applicable) are obtained utilizing NASCET criteria, using the distal internal carotid diameter  as the denominator. CONTRAST:  3mL OMNIPAQUE IOHEXOL 350 MG/ML SOLN COMPARISON:  None. FINDINGS: CTA NECK FINDINGS SKELETON: There is no bony spinal canal stenosis. No lytic or blastic lesion. OTHER NECK: Normal pharynx, larynx and major salivary glands. No cervical lymphadenopathy. Unremarkable thyroid gland. UPPER CHEST: Areas of consolidation in the right upper lobe AORTIC ARCH: There is calcific atherosclerosis of the aortic arch. There is no aneurysm, dissection or  hemodynamically significant stenosis of the visualized portion of the aorta. Conventional 3 vessel aortic branching pattern. The visualized proximal subclavian arteries are widely patent. RIGHT CAROTID SYSTEM: Normal without aneurysm, dissection or stenosis. LEFT CAROTID SYSTEM: Normal without aneurysm, dissection or stenosis. VERTEBRAL ARTERIES: Codominant configuration. Both origins are clearly patent. There is no dissection, occlusion or flow-limiting stenosis to the skull base (V1-V3 segments). CTA HEAD FINDINGS POSTERIOR CIRCULATION: --Vertebral arteries: Normal V4 segments. --Inferior cerebellar arteries: Normal. --Basilar artery: Normal. --Superior cerebellar arteries: Normal. --Posterior cerebral arteries (PCA): Normal. ANTERIOR CIRCULATION: --Intracranial internal carotid arteries: Normal. --Anterior cerebral arteries (ACA): Normal. Both A1 segments are present. Patent anterior communicating artery (a-comm). --Middle cerebral arteries (MCA): Normal. VENOUS SINUSES: As permitted by contrast timing, patent. ANATOMIC VARIANTS: None Review of the MIP images confirms the above findings. IMPRESSION: 1. No emergent large vessel occlusion or high-grade stenosis of the intracranial arteries. 2. Areas of consolidation in the right upper lobe, concerning for pneumonia. Aortic Atherosclerosis (ICD10-I70.0). Electronically Signed   By: Deatra Robinson M.D.   On: 10/27/2020 21:31   CT ANGIO CHEST PE W OR WO CONTRAST  Result Date: 11/09/2020 CLINICAL DATA:  85 year old female with concern for pulmonary embolism. Positive COVID test. EXAM: CT ANGIOGRAPHY CHEST WITH CONTRAST TECHNIQUE: Multidetector CT imaging of the chest was performed using the standard protocol during bolus administration of intravenous contrast. Multiplanar CT image reconstructions and MIPs were obtained to evaluate the vascular anatomy. CONTRAST:  Seventy-five mL Omnipaque 350, intravenous COMPARISON:  10/27/2020 FINDINGS: Cardiovascular:  Satisfactory opacification of the pulmonary arteries to the segmental level. No evidence of pulmonary embolism. Normal heart size. No pericardial effusion. Atherosclerotic calcification of the normal sized thoracic aorta, most prominent in the arch. Mediastinum/Nodes: No enlarged mediastinal, hilar, or axillary lymph nodes. Thyroid gland, trachea, and esophagus demonstrate no significant findings. Lungs/Pleura: Peripherally predominant ground-glass, streaky opacities in the right upper, bilateral middle, and bilateral lower lobes. There is subsegmental dependent atelectasis and trace bilateral pleural effusions. No suspicious pulmonary nodules. No pneumothorax. Upper Abdomen: Small hiatal hernia. The remaining visualized upper abdomen is within normal limits. Musculoskeletal: Similar appearing anterior wedge compression deformity of the T4 vertebral body with associated sclerotic appearance and associated mild kyphosis at this level. Sigmoid curvature of the thoracic spine. No acute osseous abnormality. Review of the MIP images confirms the above findings. IMPRESSION: Vascular: No evidence of acute pulmonary embolism. Non-Vascular: Bilateral midlung and lower lobe predominant streaky ground-glass opacifications with bibasilar subsegmental atelectasis. In the setting of positive COVID 19 test, these findings most likely represent multifocal pneumonia, however asymmetric pulmonary edema could appear similarly. Marliss Coots, MD [facility] Electronically Signed   By: Marliss Coots MD   On: 11/09/2020 13:23   CT ANGIO CHEST PE W OR WO CONTRAST  Result Date: 10/27/2020 CLINICAL DATA:  Hypoxia, elevated D-dimer. EXAM: CT ANGIOGRAPHY CHEST WITH CONTRAST TECHNIQUE: Multidetector CT imaging of the chest was performed using the standard protocol during bolus administration of intravenous contrast. Multiplanar CT image reconstructions and MIPs were obtained to  evaluate the vascular anatomy. CONTRAST:  60mL OMNIPAQUE IOHEXOL 350 MG/ML SOLN COMPARISON:  None. FINDINGS: Cardiovascular: Satisfactory opacification of the pulmonary arteries to the segmental level. No evidence of pulmonary embolism. Normal heart size. No pericardial effusion. Atherosclerosis of thoracic aorta is noted without aneurysm or dissection. Mediastinum/Nodes: No enlarged mediastinal, hilar, or axillary lymph nodes. Thyroid gland, trachea, and esophagus demonstrate no significant findings. Lungs/Pleura: No pneumothorax or pleural effusion is noted. Minimal bilateral posterior basilar subsegmental atelectasis is noted. Right upper lobe airspace opacity is noted concerning for pneumonia. Upper Abdomen: No acute abnormality. Musculoskeletal: No chest wall abnormality. No acute or significant osseous findings. Review of the MIP images confirms the above findings. IMPRESSION: 1. No definite evidence of pulmonary embolus. 2. Right upper lobe airspace opacity is noted concerning for pneumonia. 3. Aortic atherosclerosis. Aortic Atherosclerosis (ICD10-I70.0). Electronically Signed   By: Lupita Raider M.D.   On: 10/27/2020 20:47   MR BRAIN WO CONTRAST  Result Date: 11/08/2020 CLINICAL DATA:  Acute neuro deficit rule out stroke. EXAM: MRI HEAD WITHOUT CONTRAST TECHNIQUE: Multiplanar, multiecho pulse sequences of the brain and surrounding structures were obtained without intravenous contrast. COMPARISON:  CT head 11/08/2020.  MRI head 10/27/2020 FINDINGS: Brain: Negative for acute infarct Moderate to advanced atrophy most prominent in the frontal and temporal lobes. Ventricular enlargement consistent with atrophy. Moderate white matter changes most consistent with chronic ischemia. Negative for hemorrhage or mass. Vascular: Normal arterial flow voids. Skull and upper cervical spine: No focal skeletal lesion. Sinuses/Orbits: Mild mucosal edema paranasal sinuses. Bilateral cataract extraction Other: None  IMPRESSION: Negative for acute infarct. Stable atrophy and chronic white matter changes. Electronically Signed   By: Marlan Palau M.D.   On: 11/08/2020 12:44   MR BRAIN WO CONTRAST  Result Date: 10/28/2020 CLINICAL DATA:  Slurred speech and encephalopathy EXAM: MRI HEAD WITHOUT CONTRAST TECHNIQUE: Multiplanar, multiecho pulse sequences of the brain and surrounding structures were obtained without intravenous contrast. COMPARISON:  None. FINDINGS: Brain: No acute infarct, mass effect or extra-axial collection. No acute or chronic hemorrhage. There is multifocal hyperintense T2-weighted signal within the white matter. Generalized volume loss without a clear lobar predilection. The midline structures are normal. Vascular: Major flow voids are preserved. Skull and upper cervical spine: Normal calvarium and skull base. Visualized upper cervical spine and soft tissues are normal. Sinuses/Orbits:No paranasal sinus fluid levels or advanced mucosal thickening. No mastoid or middle ear effusion. Normal orbits. IMPRESSION: 1. No acute intracranial abnormality. 2. Generalized volume loss and findings of chronic small vessel disease. Electronically Signed   By: Deatra Robinson M.D.   On: 10/28/2020 00:09   DG CHEST PORT 1 VIEW  Result Date: 11/08/2020 CLINICAL DATA:  Acute hypoxic respiratory failure EXAM: PORTABLE CHEST 1 VIEW COMPARISON:  October 27, 2020 FINDINGS: Heart size is stable but enlarged. There are aortic calcifications. There are small bilateral pleural effusions. There are streaky bibasilar airspace opacities. There are prominent interstitial lung markings. There is no pneumothorax. IMPRESSION: 1. Prominent interstitial lung markings with small bilateral pleural effusions. Findings are suggestive of interstitial edema. However, a developing atypical infectious process is not excluded. 2. Stable cardiac silhouette with atherosclerotic changes of the thoracic aorta. Electronically Signed   By: Katherine Mantle M.D.   On: 11/08/2020 23:11   DG Chest Port 1 View  Result Date: 10/27/2020 CLINICAL DATA:  COVID-19, hypoxia, CVA symptoms, confusion EXAM: PORTABLE CHEST 1 VIEW COMPARISON:  None. FINDINGS: Single frontal view of the chest demonstrates an unremarkable cardiac silhouette. The lung volumes are diminished, with patchy  areas of bibasilar consolidation that could reflect atelectasis. There is a wedge-shaped area of ground-glass attenuation in the right lateral midlung zone. Given clinical presentation, aspiration could be considered, as well as infection. No effusion or pneumothorax. Prominent skin fold overlies the left lateral costophrenic angle. No acute bony abnormalities. IMPRESSION: 1. Wedge-shaped area of ground-glass attenuation within the lateral right midlung zone. Given clinical presentation of confusion and CVA symptoms, aspiration could be considered. Alternatively, this could reflect pneumonia. 2. Low lung volumes with bibasilar atelectasis. Electronically Signed   By: Sharlet Salina M.D.   On: 10/27/2020 17:34   EEG adult  Result Date: 11/08/2020 Charlsie Quest, MD     11/08/2020  5:40 PM Patient Name: JODY AGUINAGA MRN: [id number] Epilepsy Attending: Charlsie Quest Referring Physician/Provider: Dr Osvaldo Shipper Date: 11/08/2020 Duration: 25.01 mins Patient history: 85yo F with ams. EEG to evaluate for seizure. Level of alertness: Awake, asleep AEDs during EEG study: None Technical aspects: This EEG study was done with scalp electrodes positioned according to the 10-20 International system of electrode placement. Electrical activity was acquired at a sampling rate of 500Hz  and reviewed with a high frequency filter of 70Hz  and a low frequency filter of 1Hz . EEG data were recorded continuously and digitally stored. Description: The posterior dominant rhythm consists of 8-9 Hz activity of moderate voltage (25-35 uV) seen predominantly in posterior head regions, symmetric and reactive to  eye opening and eye closing. Sleep was characterized by vertex waves, sleep spindles (12 to 14 Hz), maximal frontocentral region. EEG showed continuous generalized 5 to 7 Hz theta slowing. Triphasic waves, generalized were also noted. Hyperventilation and photic stimulation were not performed.   ABNORMALITY -Continuous slow, generalized -Triphasic waves, generalized IMPRESSION: This study is suggestive of mild diffuse encephalopathy, nonspecific etiology but likely related to toxic-metabolic etiology. No seizures or epileptiform discharges were seen throughout the recording. Priyanka Annabelle Harman     Subjective: No acute issues or events overnight, remains pleasantly demented otherwise excited for discharge   Discharge Exam: Vitals:   11/11/20 2107 11/12/20 0544  BP: 136/74 122/69  Pulse: 62 60  Resp: 18 18  Temp: 98.4 F (36.9 C) 98 F (36.7 C)  SpO2: 96% (!) 86%   Vitals:   11/11/20 0411 11/11/20 1337 11/11/20 2107 11/12/20 0544  BP: 123/68 119/67 136/74 122/69  Pulse: 76 61 62 60  Resp: 17 16 18 18   Temp: 98 F (36.7 C) 98.6 F (37 C) 98.4 F (36.9 C) 98 F (36.7 C)  TempSrc: Axillary Axillary Oral   SpO2: 96% 94% 96% (!) 86%  Weight:    87.2 kg  Height:        General appearance: Awake alert oriented to person only. Resp: Diminished air entry at the bases.  No wheezing or rhonchi.  No crackles appreciated. Cardio: S1-S2 is normal regular.  No S3-S4.  No rubs murmurs or bruit GI: Abdomen is soft.  Nontender nondistended.  Bowel sounds are present normal.  No masses organomegaly Extremities: No edema.  Noted to be moving all of her extremities Neurologic: Follows simple commands, alert to person only and no focal deficits  The results of significant diagnostics from this hospitalization (including imaging, microbiology, ancillary and laboratory) are listed below for reference.     Microbiology: Recent Results (from the past 240 hour(s))  Culture, Urine     Status: None    Collection Time: 11/08/20 12:55 AM   Specimen: Urine, Random  Result Value Ref Range Status  Specimen Description URINE, RANDOM  Final   Special Requests NONE  Final   Culture   Final    NO GROWTH Performed at [facility], [address] [place], [place] [address]    Report Status 11/10/2020 FINAL  Final  SARS CORONAVIRUS 2 (TAT 6-24 HRS) Nasopharyngeal Nasopharyngeal Swab     Status: Abnormal   Collection Time: 11/09/20 11:56 AM   Specimen: Nasopharyngeal Swab  Result Value Ref Range Status   SARS Coronavirus 2 POSITIVE (A) NEGATIVE Final    Comment: (NOTE) SARS-CoV-2 target nucleic acids are DETECTED.  The SARS-CoV-2 RNA is generally detectable in upper and lower respiratory specimens during the acute phase of infection. Positive results are indicative of the presence of SARS-CoV-2 RNA. Clinical correlation with patient history and other diagnostic information is  necessary to determine patient infection status. Positive results do not rule out bacterial infection or co-infection with other viruses.  The expected result is Negative.  Fact Sheet for Patients: HairSlick.nohttps://www.fda.gov/media/138098/download  Fact Sheet for Healthcare Providers: quierodirigir.comhttps://www.fda.gov/media/138095/download  This test is not yet approved or cleared by the [place] FDA and  has been authorized for detection and/or diagnosis of SARS-CoV-2 by FDA under an Emergency Use Authorization (EUA). This EUA will remain  in effect (meaning this test can be used) for the duration of the COVID-19 declaration under Section 564(b)(1) of the Act, 21 U. S.C. section 360bbb-3(b)(1), unless the authorization is terminated or revoked sooner.   Performed at [facility], [address] [place], [place] [id number]      Labs: BNP (last 3 results) Recent Labs    11/09/20 0246  BNP 59.9   Basic Metabolic Panel: Recent Labs  Lab 11/08/20 0207 11/09/20 0640 11/10/20 0848 11/11/20 0316  11/12/20 0238  NA 136 136 140 141 140  K 4.0 3.8 3.8 4.0 4.0  CL 102 101 103 106 106  CO2 25 23 27 24 24   GLUCOSE 142* 189* 122* 118* 133*  BUN 26* 30* 32* 36* 33*  CREATININE 1.01* 1.05* 1.08* 1.04* 0.89  CALCIUM 8.2* 7.8* 8.1* 8.0* 8.0*   Liver Function Tests: Recent Labs  Lab 11/08/20 0207 11/09/20 0640 11/10/20 0848 11/11/20 0316 11/12/20 0238  AST 22 27 39 33 31  ALT 26 31 44 40 41  ALKPHOS 53 47 49 45 50  BILITOT 0.6 0.5 0.4 0.5 0.6  PROT 5.3* 5.2* 5.4* 5.0* 5.5*  ALBUMIN 2.3* 2.0* 2.2* 2.1* 2.2*   No results for input(s): LIPASE, AMYLASE in the last 168 hours. Recent Labs  Lab 11/08/20 2135  AMMONIA 19   CBC: Recent Labs  Lab 11/08/20 0207 11/09/20 0246 11/10/20 0848 11/11/20 0316 11/12/20 0238  WBC 9.5 9.4 11.1* 8.9 10.0  HGB 14.3 13.8 12.9 12.2 12.8  HCT 44.3 41.1 40.4 38.3 36.9  MCV 88.8 89.0 89.0 89.5 87.2  PLT 203 175 196 157 161   Cardiac Enzymes: No results for input(s): CKTOTAL, CKMB, CKMBINDEX, TROPONINI in the last 168 hours. BNP: Invalid input(s): POCBNP CBG: Recent Labs  Lab 11/11/20 0713 11/11/20 1207 11/11/20 1655 11/11/20 2107 11/12/20 0718  GLUCAP 96 115* 204* 293* 110*   D-Dimer Recent Labs    11/10/20 0848  DDIMER 0.94*   Hgb A1c No results for input(s): HGBA1C in the last 72 hours. Lipid Profile No results for input(s): CHOL, HDL, LDLCALC, TRIG, CHOLHDL, LDLDIRECT in the last 72 hours. Thyroid function studies No results for input(s): TSH, T4TOTAL, T3FREE, THYROIDAB in the last 72 hours.  Invalid input(s): FREET3 Anemia work up No results for input(s): VITAMINB12, FOLATE, FERRITIN, TIBC, IRON, RETICCTPCT in the last 72 hours. Urinalysis    Component Value Date/Time   COLORURINE YELLOW 11/09/2020 0036   APPEARANCEUR HAZY (A) 11/09/2020 0036   LABSPEC 1.025 11/09/2020 0036   PHURINE 5.0 11/09/2020 0036   GLUCOSEU 50 (A) 11/09/2020 0036   HGBUR NEGATIVE 11/09/2020 0036   HGBUR negative 06/06/2010 1359    BILIRUBINUR NEGATIVE 11/09/2020 0036   BILIRUBINUR Neg 07/02/2014 1421   KETONESUR 5 (A) 11/09/2020 0036   PROTEINUR 100 (A) 11/09/2020 0036   UROBILINOGEN 0.2 07/02/2014 1421   UROBILINOGEN 0.2 06/06/2010 1359   NITRITE NEGATIVE 11/09/2020 0036   LEUKOCYTESUR NEGATIVE 11/09/2020 0036   Sepsis Labs Invalid input(s): PROCALCITONIN,  WBC,  LACTICIDVEN Microbiology Recent Results (from the past 240 hour(s))  Culture, Urine     Status: None   Collection Time: 11/08/20 12:55 AM   Specimen: Urine, Random  Result Value Ref Range Status   Specimen Description URINE, RANDOM  Final   Special Requests NONE  Final   Culture   Final    NO GROWTH Performed at [facility], [address], [place], [place] [address]    Report Status 11/10/2020 FINAL  Final  SARS CORONAVIRUS 2 (TAT 6-24 HRS) Nasopharyngeal Nasopharyngeal Swab     Status: Abnormal   Collection Time: 11/09/20 11:56 AM   Specimen: Nasopharyngeal Swab  Result Value Ref Range Status   SARS Coronavirus 2 POSITIVE (A) NEGATIVE Final    Comment: (NOTE) SARS-CoV-2 target nucleic acids are DETECTED.  The SARS-CoV-2 RNA is generally detectable in upper and lower respiratory specimens during the acute phase of infection. Positive results are indicative of the presence of SARS-CoV-2 RNA. Clinical correlation with patient history and other diagnostic information is  necessary to determine patient infection status. Positive results do not rule out bacterial infection or co-infection with other viruses.  The expected result is Negative.  Fact Sheet for Patients: HairSlick.no  Fact Sheet for Healthcare Providers: quierodirigir.com  This test is not yet approved or cleared by the [place] FDA and  has been authorized for detection and/or diagnosis of SARS-CoV-2 by FDA under an Emergency Use Authorization (EUA). This EUA will remain  in effect (meaning this test can be  used) for the duration of the COVID-19 declaration under Section 564(b)(1) of the Act, 21 U. S.C. section 360bbb-3(b)(1), unless the authorization is terminated or revoked sooner.   Performed at [facility], [address] [place], [place] [address]      Time coordinating discharge: Over 30 minutes  SIGNED:   Azucena Fallen, DO [facility] 11/12/2020, 11:37 AM Pager   If 7PM-7AM, please contact night-coverage [email or web address]

## 2020-11-12 NOTE — TOC Progression Note (Signed)
Transition of Care [facility]) - Progression Note    Patient Details  Name: [name] [name] MRN: [id number] Date of Birth: January 22, 1936  [facility]) CM/SW Contact  Mearl Latin, LCSW Phone Number: 11/12/2020, 8:48 AM  Clinical Narrative:    CSW received call from patient's daughter, Synetta Fail. She requested that CSW move forward with [facility] even if [facility] could take patient (which they cannot due to patient being unvaccinated). CSW alerted [employer] of facility choice.   Approval received for patient to discharge today: #[id number], effective from 11/12/20-11/16/20.  Expected Discharge Plan: Skilled Nursing Facility Barriers to Discharge: Barriers Resolved  Expected Discharge Plan and Services Expected Discharge Plan: Skilled Nursing Facility In-house Referral: Clinical Social Work Discharge Planning Services: CM Consult Post Acute Care Choice: Skilled Nursing Facility Living arrangements for the past 2 months: Single Family Home Expected Discharge Date: 11/12/20                                     Social Determinants of Health (SDOH) Interventions    Readmission Risk Interventions No flowsheet data found.

## 2020-11-12 NOTE — Progress Notes (Signed)
Attempted report phone call x5 to [facility] for transfer. [facility] will arrive soon to transport patient.

## 2020-11-12 NOTE — Progress Notes (Signed)
Patient was picked up by [facility] for transport to [facility]. IV d/c and skin intact. Purwick removed. Vitals stable prior to transport. Patient's glasses were taken with her. D/C paperwork given to [facility] staff. Patient on 4L O2 [place].

## 2020-11-12 NOTE — TOC Transition Note (Signed)
Transition of Care [facility]) - CM/SW Discharge Note   Patient Details  Name: [name] [name] MRN: [id number] Date of Birth: May 24, 1936  Transition of Care [facility]) CM/SW Contact:  Mearl Latin, LCSW Phone Number: 11/12/2020, 11:53 AM   Clinical Narrative:    Patient will DC to: [facility] Anticipated DC date: 11/12/20 Family notified: Daughters, Synetta Fail and Tammy Transport by: Sharin Mons    Per MD patient ready for DC to [facility]. RN to call report prior to discharge [phone]). RN, patient, patient's family, and facility notified of DC. Discharge Summary and FL2 sent to facility. DC packet on chart. Ambulance transport requested for patient.   CSW will sign off for now as social work intervention is no longer needed. Please consult [name] again if new needs arise.      Final next level of care: Skilled Nursing Facility Barriers to Discharge: Barriers Resolved   Patient Goals and CMS Choice Patient states their goals for this hospitalization and ongoing recovery are:: Rehab CMS Medicare.gov Compare Post Acute Care list provided to:: Patient Represenative (must comment) Choice offered to / list presented to : Adult Children  Discharge Placement   Existing PASRR number confirmed : 11/12/20          Patient chooses bed at: [facility] Patient to be transferred to facility by: [facility] Name of family member notified: Daughters Patient and family notified of of transfer: 11/12/20  Discharge Plan and Services In-house Referral: Clinical Social Work Discharge Planning Services: CM Consult Post Acute Care Choice: Skilled Nursing Facility                               Social Determinants of Health (SDOH) Interventions     Readmission Risk Interventions No flowsheet data found.

## 2020-11-12 NOTE — Care Management Important Message (Signed)
Important Message  Patient Details  Name: [name] [name] MRN: [id number] Date of Birth: [birth year]   Medicare Important Message Given:  Yes - Important Message mailed due to current National Emergency  Verbal consent obtained due to current National Emergency  Relationship to patient: Spouse/Significant Other Contact Name: Djuana Littleton Call Date: 11/12/20  Time: 1249 Phone: [phone] Outcome: No Answer/Busy Important Message mailed to: Patient address on file    Orson Aloe 11/12/2020, 12:49 PM

## 2020-11-19 DIAGNOSIS — E559 Vitamin D deficiency, unspecified: Secondary | ICD-10-CM | POA: Diagnosis not present

## 2020-11-19 DIAGNOSIS — F0390 Unspecified dementia without behavioral disturbance: Secondary | ICD-10-CM | POA: Diagnosis not present

## 2020-11-19 DIAGNOSIS — E538 Deficiency of other specified B group vitamins: Secondary | ICD-10-CM | POA: Diagnosis not present

## 2020-11-19 DIAGNOSIS — G7000 Myasthenia gravis without (acute) exacerbation: Secondary | ICD-10-CM | POA: Diagnosis not present

## 2020-11-19 DIAGNOSIS — J9600 Acute respiratory failure, unspecified whether with hypoxia or hypercapnia: Secondary | ICD-10-CM | POA: Diagnosis not present

## 2020-11-19 DIAGNOSIS — U071 COVID-19: Secondary | ICD-10-CM | POA: Diagnosis not present

## 2020-11-26 DIAGNOSIS — G9340 Encephalopathy, unspecified: Secondary | ICD-10-CM | POA: Diagnosis not present

## 2020-11-26 DIAGNOSIS — E559 Vitamin D deficiency, unspecified: Secondary | ICD-10-CM | POA: Diagnosis not present

## 2020-11-26 DIAGNOSIS — E538 Deficiency of other specified B group vitamins: Secondary | ICD-10-CM | POA: Diagnosis not present

## 2020-11-26 DIAGNOSIS — G7000 Myasthenia gravis without (acute) exacerbation: Secondary | ICD-10-CM | POA: Diagnosis not present

## 2020-11-26 DIAGNOSIS — U071 COVID-19: Secondary | ICD-10-CM | POA: Diagnosis not present

## 2020-11-26 DIAGNOSIS — J9600 Acute respiratory failure, unspecified whether with hypoxia or hypercapnia: Secondary | ICD-10-CM | POA: Diagnosis not present

## 2020-11-26 DIAGNOSIS — F0390 Unspecified dementia without behavioral disturbance: Secondary | ICD-10-CM | POA: Diagnosis not present

## 2020-11-29 ENCOUNTER — Other Ambulatory Visit: Payer: Self-pay

## 2020-11-29 NOTE — Patient Outreach (Signed)
[facility]) Care Management  11/29/2020  [name] [name] November 10, 1935 [id number]     Transition of Care Referral  Referral Date: 11/29/2020  Referral Source: [facility] Discharge Report Date of Discharge: 11/28/2020 Facility: [facility] Insurance: [facility]   Referral received. Transition of care calls being completed via EMMI-automated calls. RN CM will outreach patient for any red flags received.      Plan: RN CM will close case.    Antionette Fairy, RN,BSN,CCM [facility] Care Management Telephonic Care Management Coordinator Direct Phone: [phone] Toll Free: [phone] Fax: [phone]

## 2020-11-30 ENCOUNTER — Telehealth: Payer: Self-pay | Admitting: *Deleted

## 2020-11-30 DIAGNOSIS — R0602 Shortness of breath: Secondary | ICD-10-CM | POA: Diagnosis not present

## 2020-11-30 DIAGNOSIS — I499 Cardiac arrhythmia, unspecified: Secondary | ICD-10-CM | POA: Diagnosis not present

## 2020-11-30 DIAGNOSIS — R4020 Unspecified coma: Secondary | ICD-10-CM | POA: Diagnosis not present

## 2020-11-30 DIAGNOSIS — R404 Transient alteration of awareness: Secondary | ICD-10-CM | POA: Diagnosis not present

## 2020-12-01 NOTE — Telephone Encounter (Signed)
Agreed, with the presumption that the family/patient agreed to this.  Thanks.

## 2020-12-02 NOTE — Telephone Encounter (Signed)
Called and left message for Angelique Blonder that Dr. Para March was okay with patient getting palliative care

## 2020-12-06 ENCOUNTER — Telehealth: Payer: Self-pay

## 2020-12-06 NOTE — Telephone Encounter (Signed)
Called husband and offered condolences.  She was a kind lady.  He thanked me for the call.  Please process chart.

## 2020-12-06 NOTE — Telephone Encounter (Signed)
Attempted to contact patient's daughter Synetta Fail to schedule a Palliative Care consult appointment. No answer left a message to return call.

## 2020-12-06 NOTE — Telephone Encounter (Signed)
Spoke with patient's daughter Synetta Fail to schedule Palliative care consult. Synetta Fail states patient passed away on 12/30/20. Updated teams, and canceled referral.

## 2020-12-07 NOTE — Telephone Encounter (Signed)
Chart has been processed. °

## 2020-12-08 ENCOUNTER — Telehealth: Payer: Self-pay | Admitting: Family Medicine

## 2020-12-08 NOTE — Telephone Encounter (Signed)
[name] [name],[facility],called.  He's requesting Dr.Duncan go in to [facility] and sign death certificate. It was put in on 12/03/20.

## 2020-12-08 NOTE — Telephone Encounter (Signed)
Done.  Thanks.  I printed a working copy for [name] to scan.

## 2020-12-28 DIAGNOSIS — 419620001 Death: Secondary | SNOMED CT | POA: Diagnosis not present

## 2020-12-28 NOTE — Telephone Encounter (Signed)
[name] [name] with [facility] left a voicemail stating that patient is being discharged from [facility]. [name] [name] stated that the family is requesting palliative care for the patient. Please call back to advised if Dr. Para March will okay these services for the patient. When calling back press option 2

## 2020-12-28 DEATH — deceased

## 2021-04-23 IMAGING — CT CT HEAD W/O CM
4 series · 16 of 47 positions shown, 18 images · non-contrast
Comparison: 10/27/2020

CLINICAL DATA: Mental status change with unknown cause.

EXAM:
CT HEAD WITHOUT CONTRAST
TECHNIQUE: Contiguous axial images were obtained from the base of the skull
through the vertex without intravenous contrast.

[Series 3: head bone · axial · 0.45mm/px · z∈[-150,-114]mm · 3 of 88 slices shown]
[im 9/88  bone]
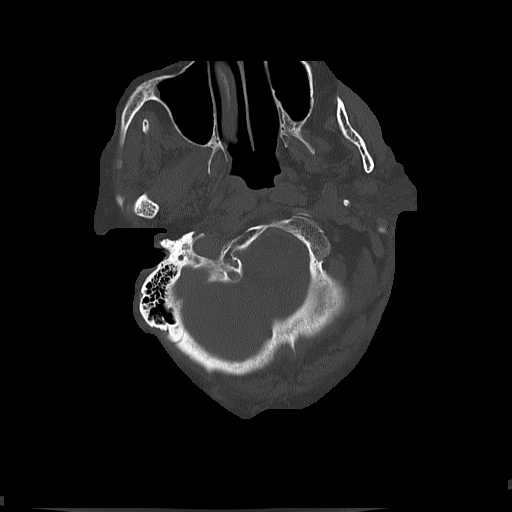
[im 18/88  bone]
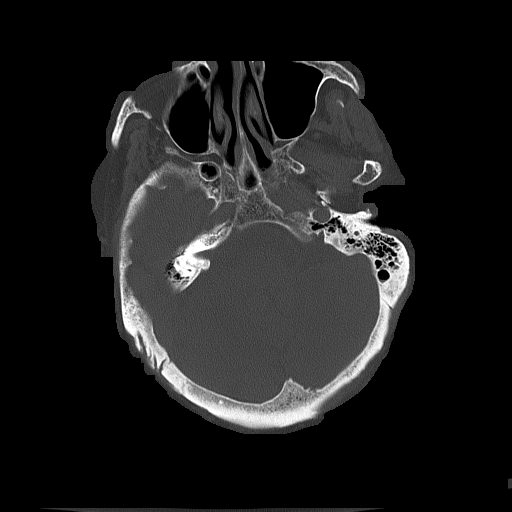
[im 27/88  bone]
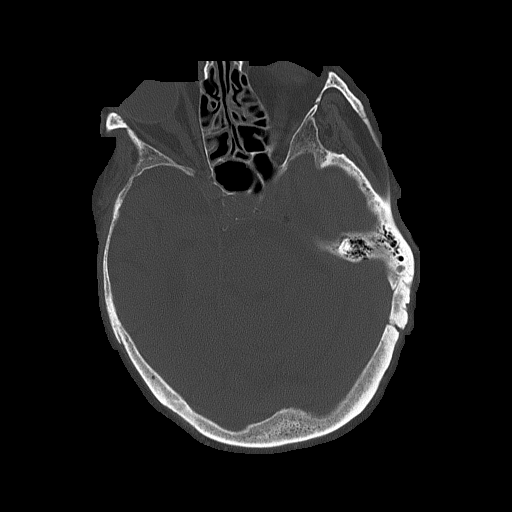

[Series 4: head without · axial · non-contrast · 0.45mm/px · z∈[-146,-21]mm · 7 of 35 slices shown, 9 images]
[im 5/35  brain]
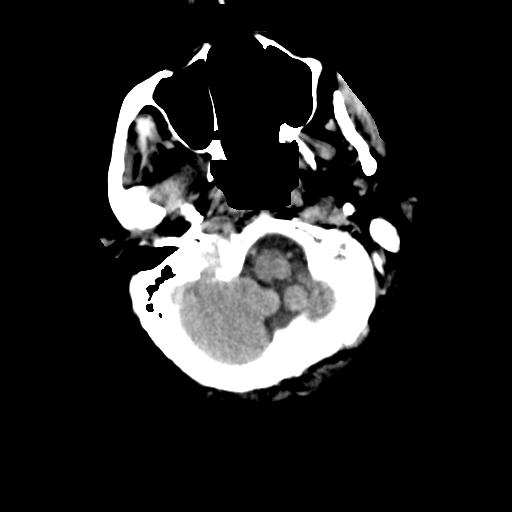
[im 5/35  bone]
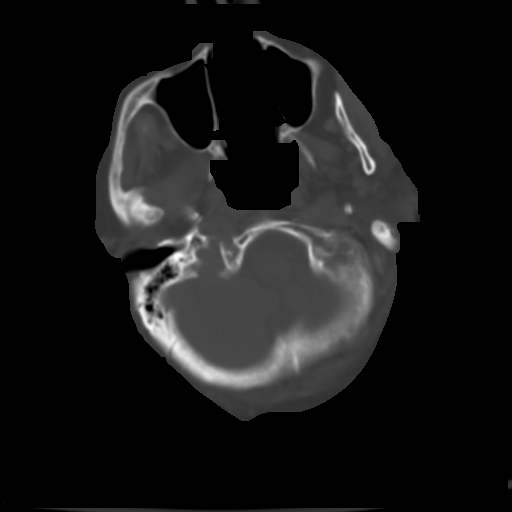
[im 9/35  brain]
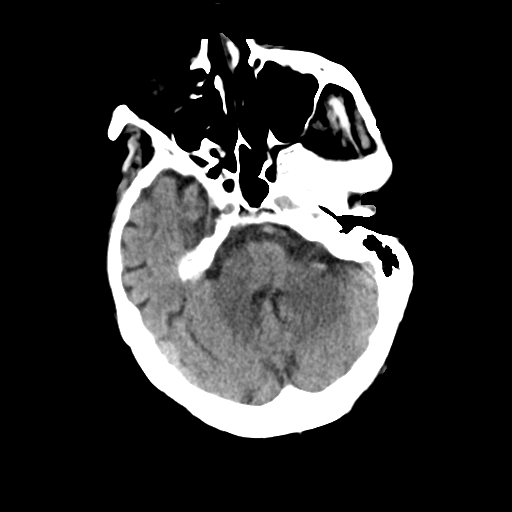
[im 13/35  brain]
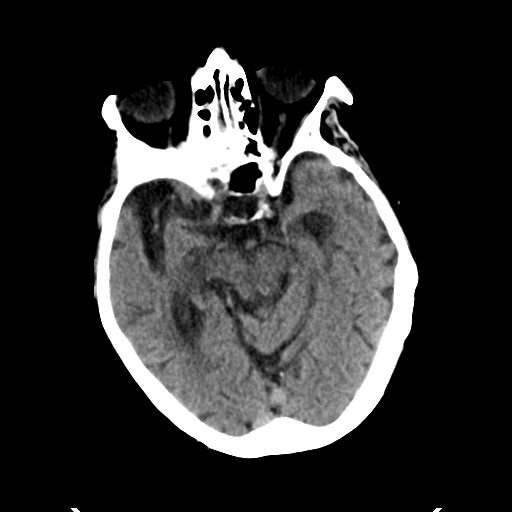
[im 18/35  brain]
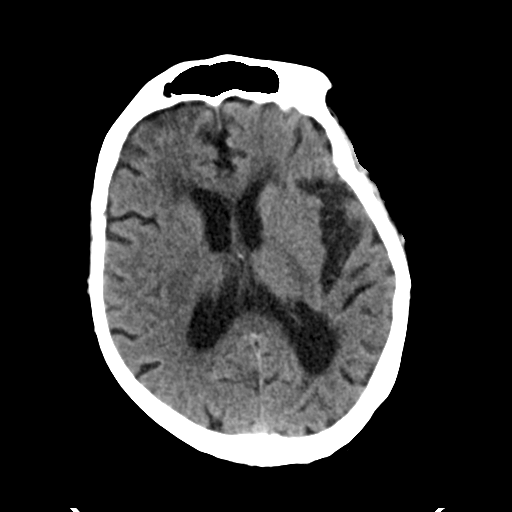
[im 22/35  brain]
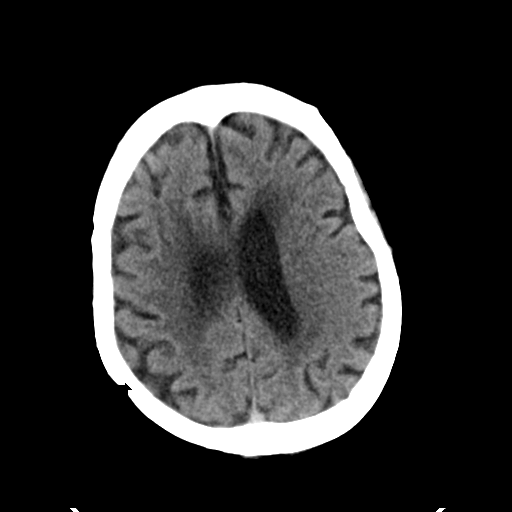
[im 22/35  bone]
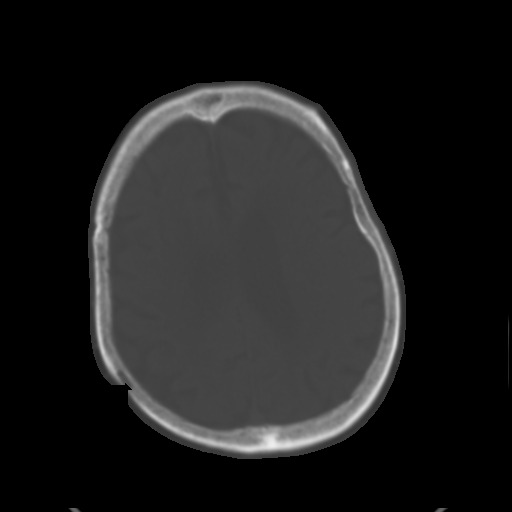
[im 26/35  brain]
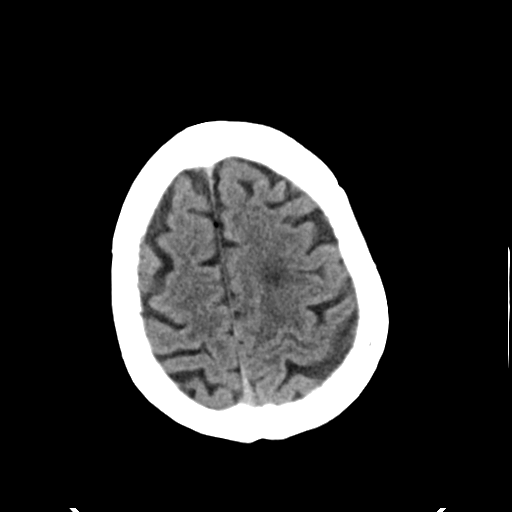
[im 30/35  brain]
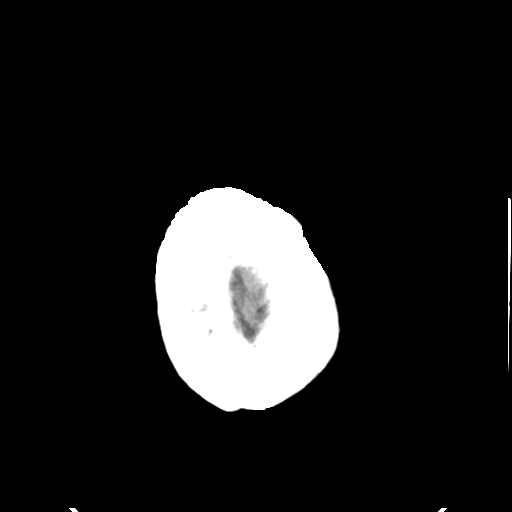

[Series 5: head without cor · coronal · non-contrast · 0.34mm/px · 3 of 74 slices shown]
[im 25/74  brain]
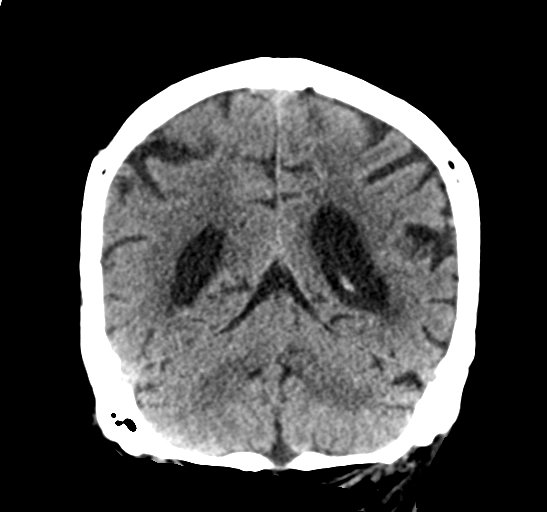
[im 33/74  brain]
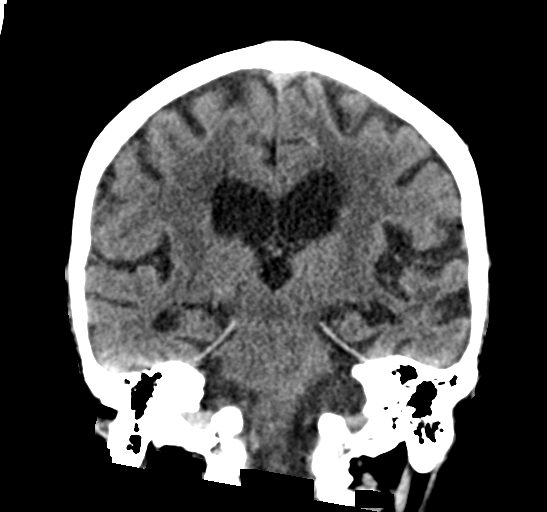
[im 41/74  brain]
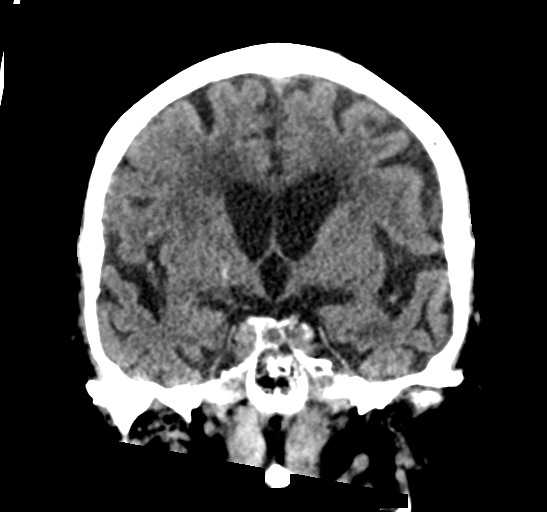

[Series 6: head without sag · sagittal · non-contrast · 0.34mm/px · 3 of 62 slices shown]
[im 26/62  brain]
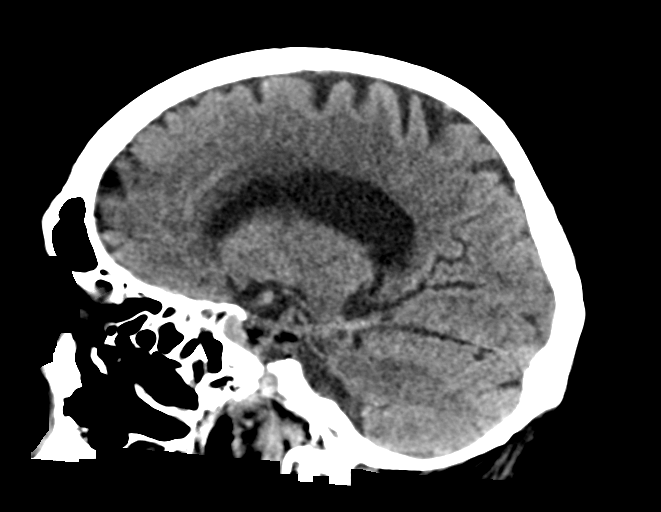
[im 31/62  brain]
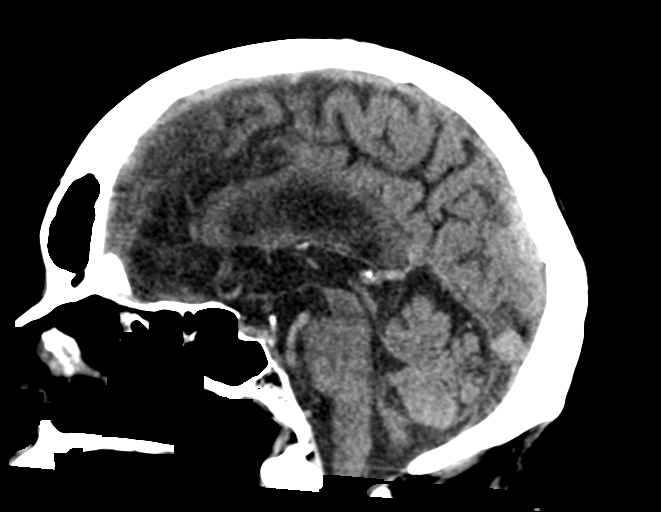
[im 36/62  brain]
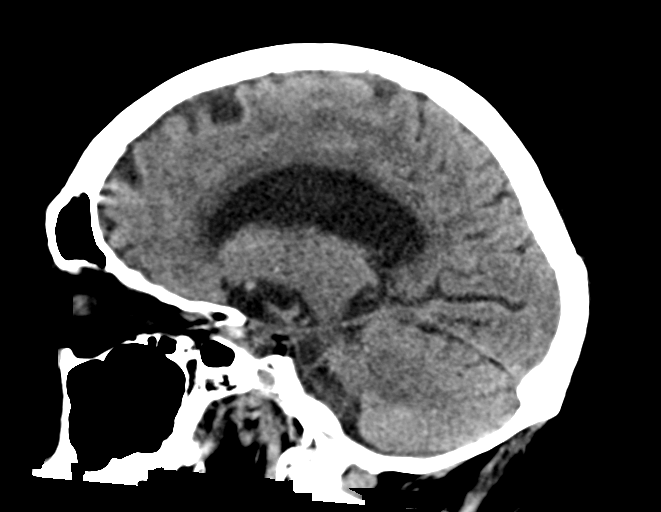

[16 of 47 positions shown; findings below may reference images not displayed]

FINDINGS: Brain: The right pons appears low-density on a few slices, but
limited by typical posterior fossa artifact with band of low-density
seen on sagittal reformats. Cerebral atrophy and chronic small
vessel ischemia. Volume loss is especially notable in the temporal
lobes. There has been a remote cortically based infarct in the
superior left temporal lobe. Confluent chronic small vessel ischemia
in the cerebral white matter.

Vascular: Atheromatous calcification.

Skull: No acute or aggressive finding.

Sinuses/Orbits: Mild generalized mucosal thickening in the paranasal
sinuses. Bilateral cataract resection.
IMPRESSION: 1. Artifact versus infarct in the right pons, favor artifact. Please
correlate for bulbar symptoms.
2. Brain atrophy and chronic small vessel ischemia.

## 2021-04-23 IMAGING — DX DG CHEST 1V PORT
1 series · 1 of 1 positions shown · non-contrast
Comparison: October 27, 2020

CLINICAL DATA: Acute hypoxic respiratory failure

EXAM:
PORTABLE CHEST 1 VIEW

[chest ap]
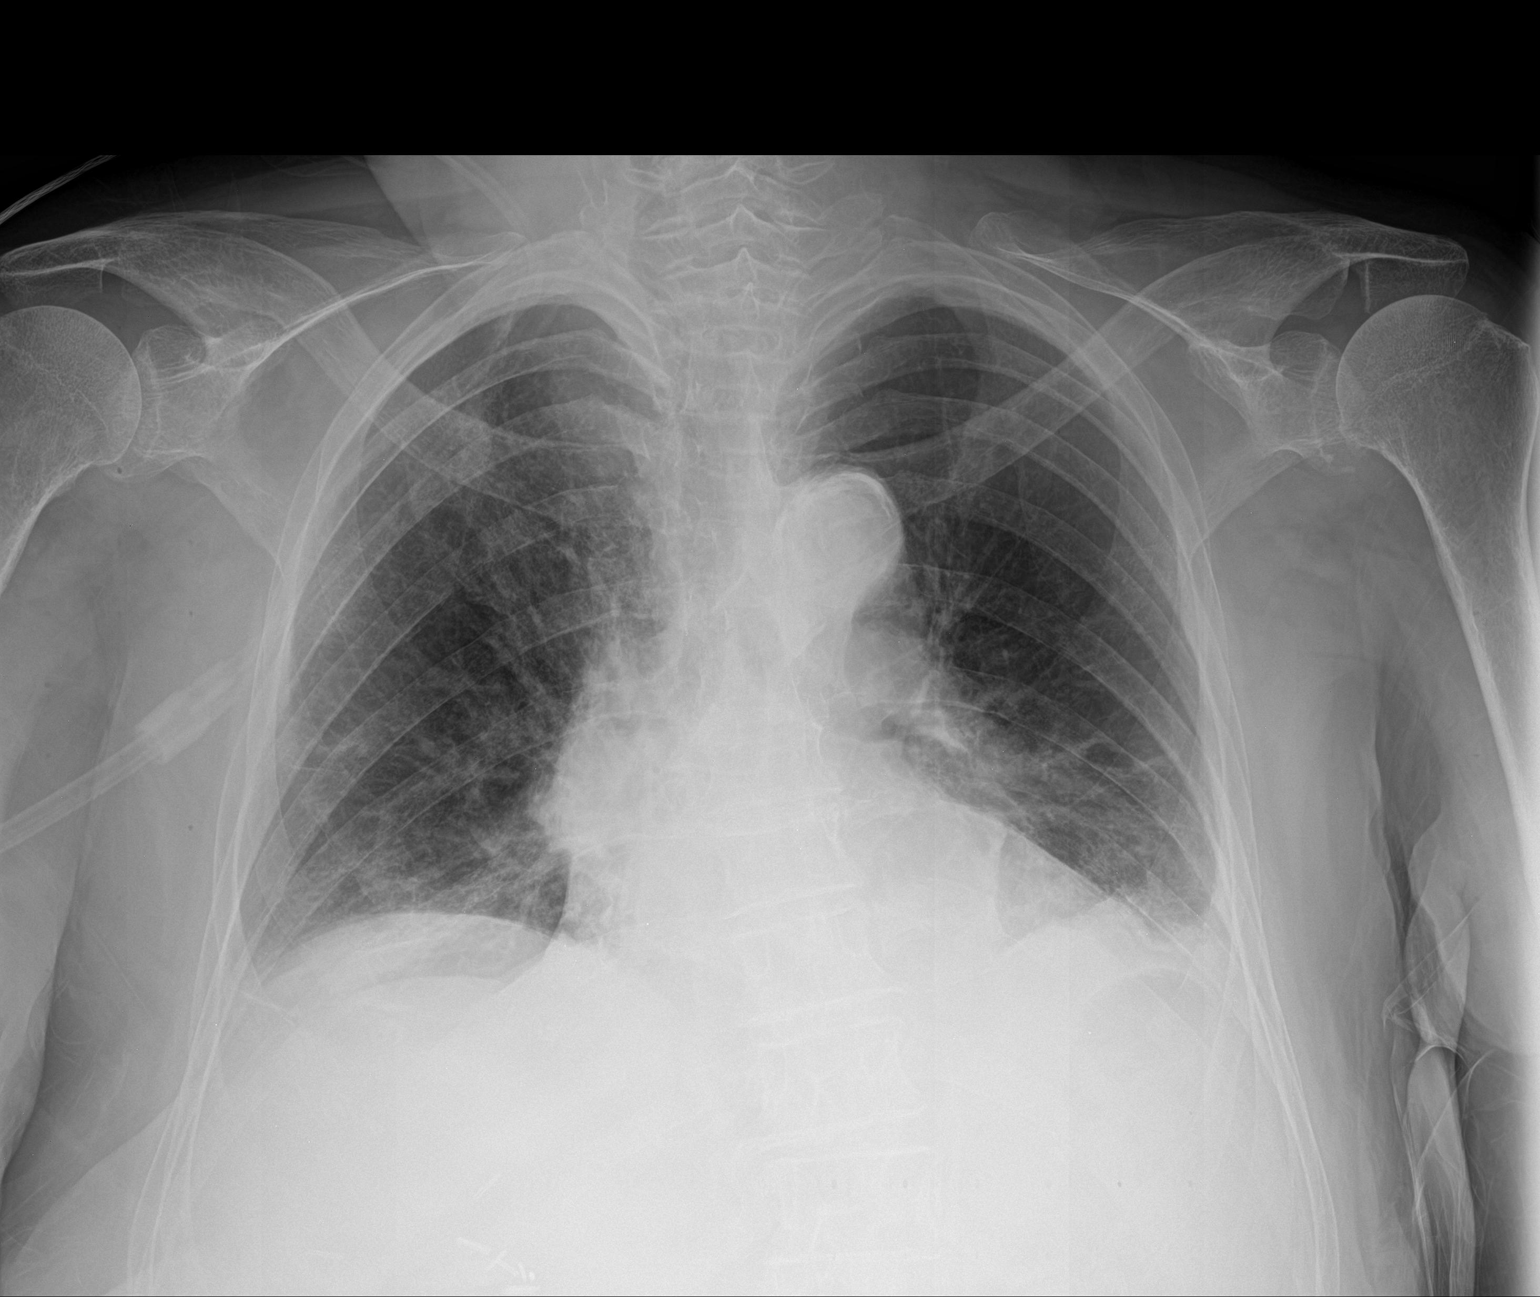

[1 of 1 positions shown; findings below may reference images not displayed]

FINDINGS: Heart size is stable but enlarged. There are aortic calcifications.
There are small bilateral pleural effusions. There are streaky
bibasilar airspace opacities. There are prominent interstitial lung
markings. There is no pneumothorax.
IMPRESSION: 1. Prominent interstitial lung markings with small bilateral pleural
effusions. Findings are suggestive of interstitial edema. However, a
developing atypical infectious process is not excluded.
2. Stable cardiac silhouette with atherosclerotic changes of the
thoracic aorta.
